# Patient Record
Sex: Female | Born: 1980
Health system: Southern US, Community
[De-identification: ages and names within clinical notes are randomized; demographics above are authoritative.]

## PROBLEM LIST (undated history)

## (undated) ENCOUNTER — Ambulatory Visit: Admission: EM

## (undated) ENCOUNTER — Inpatient Hospital Stay (HOSPITAL_COMMUNITY): Payer: Self-pay

## (undated) DIAGNOSIS — A630 Anogenital (venereal) warts: Secondary | ICD-10-CM

## (undated) DIAGNOSIS — D649 Anemia, unspecified: Secondary | ICD-10-CM

## (undated) DIAGNOSIS — Z789 Other specified health status: Secondary | ICD-10-CM

## (undated) DIAGNOSIS — K219 Gastro-esophageal reflux disease without esophagitis: Secondary | ICD-10-CM

## (undated) DIAGNOSIS — D219 Benign neoplasm of connective and other soft tissue, unspecified: Secondary | ICD-10-CM

## (undated) DIAGNOSIS — E079 Disorder of thyroid, unspecified: Secondary | ICD-10-CM

## (undated) HISTORY — PX: NO PAST SURGERIES: SHX2092

---

## 2007-07-23 ENCOUNTER — Ambulatory Visit: Payer: Self-pay | Admitting: Vascular Surgery

## 2008-07-23 ENCOUNTER — Ambulatory Visit: Payer: Self-pay | Admitting: Vascular Surgery

## 2010-08-09 NOTE — Consult Note (Signed)
VASCULAR SURGERY CONSULTATION   Selena Scott, Aidah  DOB:  March 16, 1981                                       07/23/2007  ZOXWR#:[id number]   The patient was referred for vascular surgery consultation regarding  possible venous insufficiency of her right lower extremity.  This 30-  year-old female who is gravida 2 para 2 had her most recent pregnancy 1  year ago.  She developed some varicosities in the right leg during the  third trimester and has had aching discomfort associated with these  varicosities in the right leg since then.  She states that it improved  the first few months after pregnancy, but then worsened over the last  several months.  She has not worn elastic compression stockings since  her pregnancy, but does elevate the leg periodically with some  improvement.  She has no distal edema and has no history of deep venous  thrombosis, thrombophlebitis, pulmonary emboli, bleeding, ulceration, et  Selena Scott.  She states that the discomfort becomes so severe that she has  difficulty working, which requires standing.  This is in the thigh and  posterior calf region of the right leg.   PAST MEDICAL HISTORY:  Negative for diabetes, hypertension, coronary  artery disease, stroke, COPD, hyperlipidemia, or asthma.   PREVIOUS SURGERY:  None.   FAMILY HISTORY:  Is negative for diabetes, positive for coronary artery  disease with grandfather having 2 myocardial infarctions, positive for  stroke.   SOCIAL HISTORY:  She is single, has 2 children, works in Air[employer].  She  does not use tobacco or alcohol.   REVIEW OF SYSTEMS:  Remarkable in all systems.   On physical exam, blood pressure 110/67, heart rate is 70, respirations  14.  Generally, she is a healthy-appearing female in no apparent  distress, alert and oriented x3.  Her neck is supple, 3+ carotid pulses  palpable.  No bruits are audible.  Neurologic exam is normal.  No  palpable adenopathy in the neck.  Chest,  clear to auscultation.  Cardiovascular exam reveals a regular rhythm with no murmurs.  Abdomen  is soft, nontender with no palpable masses.  Extremity exam reveals 3+  femoral, popliteal, dorsalis pedis, and posterior tibial pulses  palpable.  There is no distal edema.  Right leg has some small  varicosities in the distal medial thigh and posteriorly.  There is a  prominent reticular vein in the distal thigh extending down to the  popliteal fossa, a few small varicosities in the posterior calf.  No  hyperpigmentation, ulceration, or serious severe venous insufficiency.   Venous duplex exam was performed.  There is no evidence of reflux in the  great saphenous vein or the small saphenous vein and the deep system is  widely patent.   She was reassured regarding these findings.  It was suggested she wear  elastic compression stockings to relieve these symptoms to help her with  work, and if she needs treatment, primary sclerotherapy would be the  best option for these.   Selena Scott, M.D.  Electronically Signed  JDL/MEDQ  D:  07/23/2007  T:  07/24/2007  Job:  [id number]

## 2010-08-09 NOTE — Procedures (Signed)
LOWER EXTREMITY VENOUS REFLUX EXAM   INDICATION:  Right leg varicose vein with pain and swelling.   EXAM:  Using color-flow imaging and pulse Doppler spectral analysis, the  right femoral, superficial femoral, popliteal, posterior tibial, greater  and lesser saphenous veins are evaluated.  There is no evidence  suggesting deep venous insufficiency in the right lower extremity.   The right saphenofemoral junction is competent.  The right GSV is  competent with the caliber as described below.   The right proximal short saphenous vein demonstrates competency.   GSV Diameter (used if found to be incompetent only)                                            Right    Left  Proximal Greater Saphenous Vein           cm       cm  Proximal-to-mid-thigh                     cm       cm  Mid thigh                                 cm       cm  Mid-distal thigh                          cm       cm  Distal thigh                              cm       cm  Knee                                      cm       cm   IMPRESSION:  1. No evidence of reflux noted in the right leg.  2. The right greater saphenous vein is not aneurysmal.  3. The right greater saphenous vein is not tortuous.  4. The deep system is competent.  5. The right lesser saphenous vein is competent.  6. No evidence of deep venous thrombosis noted in the right leg.   ___________________________________________  Quita Skye. Hart Rochester, M.D.   MG/MEDQ  D:  07/23/2007  T:  07/23/2007  Job:  [id number]

## 2015-03-28 NOTE — L&D Delivery Note (Signed)
Delivery Note At 9:05 AM a viable female was delivered via Vaginal, Spontaneous Delivery (Presentation: vtx; ROP ).  APGAR: 9, 9; weight  pending.   Placenta status: spontaneous, intact.  Cord:  with the following complications: nuchal x 2 reduced.   Anesthesia:  Epidural Episiotomy:  None Lacerations:  1st degree Suture Repair: none Est. Blood Loss (mL):  200  Mom to postpartum.  Baby to Couplet care / Skin to Skin.  Will do circumcision in the office  Orlando Devereux D 01/13/2016, 9:19 AM

## 2015-06-04 DIAGNOSIS — A6000 Herpesviral infection of urogenital system, unspecified: Secondary | ICD-10-CM | POA: Insufficient documentation

## 2015-07-01 LAB — OB RESULTS CONSOLE HIV ANTIBODY (ROUTINE TESTING): HIV: NONREACTIVE

## 2015-07-01 LAB — OB RESULTS CONSOLE RUBELLA ANTIBODY, IGM: Rubella: IMMUNE

## 2015-07-01 LAB — OB RESULTS CONSOLE RPR: RPR: NONREACTIVE

## 2015-07-01 LAB — OB RESULTS CONSOLE ANTIBODY SCREEN: ANTIBODY SCREEN: NEGATIVE

## 2015-07-01 LAB — OB RESULTS CONSOLE GC/CHLAMYDIA
CHLAMYDIA, DNA PROBE: NEGATIVE
GC PROBE AMP, GENITAL: NEGATIVE

## 2015-07-01 LAB — OB RESULTS CONSOLE ABO/RH: RH Type: POSITIVE

## 2015-07-01 LAB — OB RESULTS CONSOLE HEPATITIS B SURFACE ANTIGEN: Hepatitis B Surface Ag: NEGATIVE

## 2015-07-30 ENCOUNTER — Other Ambulatory Visit (HOSPITAL_COMMUNITY): Payer: Self-pay | Admitting: Obstetrics and Gynecology

## 2015-07-30 DIAGNOSIS — Z3A18 18 weeks gestation of pregnancy: Secondary | ICD-10-CM

## 2015-07-30 DIAGNOSIS — O09522 Supervision of elderly multigravida, second trimester: Secondary | ICD-10-CM

## 2015-07-30 DIAGNOSIS — Z3689 Encounter for other specified antenatal screening: Secondary | ICD-10-CM

## 2015-08-03 ENCOUNTER — Encounter (HOSPITAL_COMMUNITY): Payer: Self-pay | Admitting: *Deleted

## 2015-08-03 ENCOUNTER — Inpatient Hospital Stay (HOSPITAL_COMMUNITY)
Admission: AD | Admit: 2015-08-03 | Discharge: 2015-08-03 | Disposition: A | Discharge status: Home / Self Care | Payer: Managed Care, Other (non HMO) | Source: Ambulatory Visit | Attending: Obstetrics and Gynecology | Admitting: Obstetrics and Gynecology

## 2015-08-03 DIAGNOSIS — Z3A16 16 weeks gestation of pregnancy: Secondary | ICD-10-CM | POA: Diagnosis not present

## 2015-08-03 DIAGNOSIS — O99612 Diseases of the digestive system complicating pregnancy, second trimester: Secondary | ICD-10-CM | POA: Diagnosis not present

## 2015-08-03 DIAGNOSIS — A084 Viral intestinal infection, unspecified: Secondary | ICD-10-CM | POA: Diagnosis not present

## 2015-08-03 DIAGNOSIS — R197 Diarrhea, unspecified: Secondary | ICD-10-CM | POA: Diagnosis present

## 2015-08-03 HISTORY — DX: Other specified health status: Z78.9

## 2015-08-03 LAB — URINALYSIS, ROUTINE W REFLEX MICROSCOPIC
BILIRUBIN URINE: NEGATIVE
GLUCOSE, UA: NEGATIVE mg/dL
KETONES UR: 40 mg/dL — AB
Leukocytes, UA: NEGATIVE
Nitrite: NEGATIVE
PROTEIN: NEGATIVE mg/dL
Specific Gravity, Urine: 1.025 (ref 1.005–1.030)
pH: 5.5 (ref 5.0–8.0)

## 2015-08-03 LAB — COMPREHENSIVE METABOLIC PANEL
ALBUMIN: 3.3 g/dL — AB (ref 3.5–5.0)
ALK PHOS: 57 U/L (ref 38–126)
ALT: 26 U/L (ref 14–54)
ANION GAP: 10 (ref 5–15)
AST: 35 U/L (ref 15–41)
BUN: 5 mg/dL — ABNORMAL LOW (ref 6–20)
CALCIUM: 9 mg/dL (ref 8.9–10.3)
CO2: 20 mmol/L — AB (ref 22–32)
CREATININE: 0.47 mg/dL (ref 0.44–1.00)
Chloride: 105 mmol/L (ref 101–111)
GFR calc Af Amer: >60 mL/min (ref >60)
GFR calc non Af Amer: >60 mL/min (ref >60)
GLUCOSE: 83 mg/dL (ref 65–99)
Potassium: 3.7 mmol/L (ref 3.5–5.1)
SODIUM: 135 mmol/L (ref 135–145)
Total Bilirubin: 0.9 mg/dL (ref 0.3–1.2)
Total Protein: 6.4 g/dL — ABNORMAL LOW (ref 6.5–8.1)

## 2015-08-03 LAB — URINE MICROSCOPIC-ADD ON

## 2015-08-03 LAB — CBC
HCT: 34.9 % — ABNORMAL LOW (ref 36.0–46.0)
HEMOGLOBIN: 12.7 g/dL (ref 12.0–15.0)
MCH: 28.9 pg (ref 26.0–34.0)
MCHC: 36.4 g/dL — ABNORMAL HIGH (ref 30.0–36.0)
MCV: 79.5 fL (ref 78.0–100.0)
Platelets: 244 10*3/uL (ref 150–400)
RBC: 4.39 MIL/uL (ref 3.87–5.11)
RDW: 13.6 % (ref 11.5–15.5)
WBC: 6.6 10*3/uL (ref 4.0–10.5)

## 2015-08-03 MED ORDER — PROMETHAZINE HCL 12.5 MG PO TABS
12.5000 mg | ORAL_TABLET | Freq: Four times a day (QID) | ORAL | Status: DC | PRN
Start: 2015-08-03 — End: 2016-01-13

## 2015-08-03 MED ORDER — ONDANSETRON HCL 4 MG PO TABS
4.0000 mg | ORAL_TABLET | Freq: Four times a day (QID) | ORAL | Status: DC
Start: 2015-08-03 — End: 2016-01-13

## 2015-08-03 MED ORDER — DEXTROSE 5 % IN LACTATED RINGERS IV BOLUS
1000.0000 mL | Freq: Once | INTRAVENOUS | Status: AC
Start: 2015-08-03 — End: 2015-08-03
  Administered 2015-08-03 (×2): 1000 mL via INTRAVENOUS

## 2015-08-03 MED ORDER — LACTATED RINGERS IV SOLN: INTRAVENOUS | Status: DC

## 2015-08-03 MED ORDER — PROMETHAZINE HCL 25 MG/ML IJ SOLN
25.0000 mg | Freq: Once | INTRAVENOUS | Status: AC
  Administered 2015-08-03: 25 mg via INTRAVENOUS
  Filled 2015-08-03: qty 1

## 2015-08-03 MED ORDER — SODIUM CHLORIDE 0.9 % IV SOLN
8.0000 mg | Freq: Once | INTRAVENOUS | Status: AC
  Administered 2015-08-03: 8 mg via INTRAVENOUS
  Filled 2015-08-03: qty 4

## 2015-08-03 NOTE — Progress Notes (Signed)
Aware of pt's admisson and status. MAU provider to see pt

## 2015-08-03 NOTE — Discharge Instructions (Signed)
Viral Gastroenteritis Viral gastroenteritis is also called stomach flu. This illness is caused by a certain type of germ (virus). It can cause sudden watery poop (diarrhea) and throwing up (vomiting). This can cause you to lose body fluids (dehydration). This illness usually lasts for 3 to 8 days. It usually goes away on its own. HOME CARE   Drink enough fluids to keep your pee (urine) clear or pale yellow. Drink small amounts of fluids often.  Ask your doctor how to replace body fluid losses (rehydration).  Avoid:  Foods high in sugar.  Alcohol.  Bubbly (carbonated) drinks.  Tobacco.  Juice.  Caffeine drinks.  Very hot or cold fluids.  Fatty, greasy foods.  Eating too much at one time.  Dairy products until 24 to 48 hours after your watery poop stops.  You may eat foods with active cultures (probiotics). They can be found in some yogurts and supplements.  Wash your hands well to avoid spreading the illness.  Only take medicines as told by your doctor. Do not give aspirin to children. Do not take medicines for watery poop (antidiarrheals).  Ask your doctor if you should keep taking your regular medicines.  Keep all doctor visits as told. GET HELP RIGHT AWAY IF:   You cannot keep fluids down.  You do not pee at least once every 6 to 8 hours.  You are short of breath.  You see blood in your poop or throw up. This may look like coffee grounds.  You have belly (abdominal) pain that gets worse or is just in one small spot (localized).  You keep throwing up or having watery poop.  You have a fever.  The patient is a child younger than 3 months, and he or she has a fever.  The patient is a child older than 3 months, and he or she has a fever and problems that do not go away.  The patient is a child older than 3 months, and he or she has a fever and problems that suddenly get worse.  The patient is a baby, and he or she has no tears when crying. MAKE SURE YOU:     Understand these instructions.  Will watch your condition.  Will get help right away if you are not doing well or get worse.   This information is not intended to replace advice given to you by your health care provider. Make sure you discuss any questions you have with your health care provider.   Document Released: 08/30/2007 Document Revised: 06/05/2011 Document Reviewed: 12/28/2010 Elsevier Interactive Patient Education 2016 [employer] Choices to Help Relieve Diarrhea, Adult When you have diarrhea, the foods you eat and your eating habits are very important. Choosing the right foods and drinks can help relieve diarrhea. Also, because diarrhea can last up to 7 days, you need to replace lost fluids and electrolytes (such as sodium, potassium, and chloride) in order to help prevent dehydration.  WHAT GENERAL GUIDELINES DO I NEED TO FOLLOW?  Slowly drink 1 cup (8 oz) of fluid for each episode of diarrhea. If you are getting enough fluid, your urine will be clear or pale yellow.  Eat starchy foods. Some good choices include white rice, white toast, pasta, low-fiber cereal, baked potatoes (without the skin), saltine crackers, and bagels.  Avoid large servings of any cooked vegetables.  Limit fruit to two servings per day. A serving is  cup or 1 small piece.  Choose foods with less than 2  g of fiber per serving.  Limit fats to less than 8 tsp (38 g) per day.  Avoid fried foods.  Eat foods that have probiotics in them. Probiotics can be found in certain dairy products.  Avoid foods and beverages that may increase the speed at which food moves through the stomach and intestines (gastrointestinal tract). Things to avoid include:  High-fiber foods, such as dried fruit, raw fruits and vegetables, nuts, seeds, and whole grain foods.  Spicy foods and high-fat foods.  Foods and beverages sweetened with high-fructose corn syrup, honey, or sugar alcohols such as xylitol,  sorbitol, and mannitol. WHAT FOODS ARE RECOMMENDED? Grains White rice. White, [place], or pita breads (fresh or toasted), including plain rolls, buns, or bagels. White pasta. Saltine, soda, or graham crackers. Pretzels. Low-fiber cereal. Cooked cereals made with water (such as cornmeal, farina, or cream cereals). Plain muffins. Matzo. Melba toast. Zwieback.  Vegetables Potatoes (without the skin). Strained tomato and vegetable juices. Most well-cooked and canned vegetables without seeds. Tender lettuce. Fruits Cooked or canned applesauce, apricots, cherries, fruit cocktail, grapefruit, peaches, pears, or plums. Fresh bananas, apples without skin, cherries, grapes, cantaloupe, grapefruit, peaches, oranges, or plums.  Meat and Other Protein Products Baked or boiled chicken. Eggs. Tofu. Fish. Seafood. Smooth peanut butter. Ground or well-cooked tender beef, ham, veal, lamb, pork, or poultry.  Dairy Plain yogurt, kefir, and unsweetened liquid yogurt. Lactose-free milk, buttermilk, or soy milk. Plain hard cheese. Beverages Sport drinks. Clear broths. Diluted fruit juices (except prune). Regular, caffeine-free sodas such as ginger ale. Water. Decaffeinated teas. Oral rehydration solutions. Sugar-free beverages not sweetened with sugar alcohols. Other Bouillon, broth, or soups made from recommended foods.  The items listed above may not be a complete list of recommended foods or beverages. Contact your dietitian for more options. WHAT FOODS ARE NOT RECOMMENDED? Grains Whole grain, whole wheat, bran, or rye breads, rolls, pastas, crackers, and cereals. Wild or brown rice. Cereals that contain more than 2 g of fiber per serving. Corn tortillas or taco shells. Cooked or dry oatmeal. Granola. Popcorn. Vegetables Raw vegetables. Cabbage, broccoli, Brussels sprouts, artichokes, baked beans, beet greens, corn, kale, legumes, peas, sweet potatoes, and yams. Potato skins. Cooked spinach and  cabbage. Fruits Dried fruit, including raisins and dates. Raw fruits. Stewed or dried prunes. Fresh apples with skin, apricots, mangoes, pears, raspberries, and strawberries.  Meat and Other Protein Products Chunky peanut butter. Nuts and seeds. Beans and lentils. Berniece Salines.  Dairy High-fat cheeses. Milk, chocolate milk, and beverages made with milk, such as milk shakes. Cream. Ice cream. Sweets and Desserts Sweet rolls, doughnuts, and sweet breads. Pancakes and waffles. Fats and Oils Butter. Cream sauces. Margarine. Salad oils. Plain salad dressings. Olives. Avocados.  Beverages Caffeinated beverages (such as coffee, tea, soda, or energy drinks). Alcoholic beverages. Fruit juices with pulp. Prune juice. Soft drinks sweetened with high-fructose corn syrup or sugar alcohols. Other Coconut. Hot sauce. Chili powder. Mayonnaise. Gravy. Cream-based or milk-based soups.  The items listed above may not be a complete list of foods and beverages to avoid. Contact your dietitian for more information. WHAT SHOULD I DO IF I BECOME DEHYDRATED? Diarrhea can sometimes lead to dehydration. Signs of dehydration include dark urine and dry mouth and skin. If you think you are dehydrated, you should rehydrate with an oral rehydration solution. These solutions can be purchased at pharmacies, retail stores, or online.  Drink -1 cup (120-240 mL) of oral rehydration solution each time you have an episode of diarrhea. If drinking this  amount makes your diarrhea worse, try drinking smaller amounts more often. For example, drink 1-3 tsp (5-15 mL) every 5-10 minutes.  A general rule for staying hydrated is to drink 1-2 L of fluid per day. Talk to your health care provider about the specific amount you should be drinking each day. Drink enough fluids to keep your urine clear or pale yellow.   This information is not intended to replace advice given to you by your health care provider. Make sure you discuss any questions you  have with your health care provider.   Document Released: 06/03/2003 Document Revised: 04/03/2014 Document Reviewed: 02/03/2013 Elsevier Interactive Patient Education [employer].

## 2015-08-03 NOTE — MAU Note (Addendum)
Yesterday was very bloated and took 2 gas pills. Few hours later started with diarrhea which continues. L breast pain. Very weak and uncomfortable.Nausea but no vomiting. No appetite. Denies vag bleeding or d/c. Had headache earlier today and Tylenol helped with that

## 2015-08-03 NOTE — MAU Provider Note (Signed)
History     CSN: [id number]  Arrival date and time: 08/03/15 1615   First Provider Initiated Contact with Patient 08/03/15 1729      Chief Complaint  Patient presents with  . Diarrhea  . Morning Sickness  . Abdominal Pain   HPI  Selena Scott is a 35 y.o. G3P2002 at [redacted]w[redacted]d who presents with complaints of nausea, diarrhea, and abdominal pain. Symptoms began yesterday. Reports 4 watery stools in the last 24 hours. Nauseated; no vomiting. Generalized abdominal pain that comes & goes; rates pain 5/10; no treatment. Pain occurs every few hours and lasts for a few minutes at a time.  Denies fever, LOF, vaginal bleeding. No sick contacts. Also reports chest pain; unsure if it's in her breast or chest. Worse with palpation. Feels "heavy". Denies SOB or palpitations.   OB History    Gravida Para Term Preterm AB TAB SAB Ectopic Multiple Living   3 2 2       2       Past Medical History  Diagnosis Date  . Medical history non-contributory     Past Surgical History  Procedure Laterality Date  . No past surgeries      History reviewed. No pertinent family history.  Social History  Substance Use Topics  . Smoking status: Never Smoker   . Smokeless tobacco: None  . Alcohol Use: No    Allergies: No Known Allergies  Prescriptions prior to admission  Medication Sig Dispense Refill Last Dose  . acetaminophen (TYLENOL) 500 MG tablet Take 1,000 mg by mouth every 6 (six) hours as needed for moderate pain.    08/03/2015 at 1530  . Prenatal Vit-Fe Fumarate-FA (PRENATAL MULTIVITAMIN) TABS tablet Take 1 tablet by mouth daily at 12 noon.   08/02/2015 at Unknown time  . Simethicone 125 MG TABS Take 2 tablets by mouth daily as needed (gas).    08/02/2015 at Unknown time    Review of Systems  Constitutional: Negative for fever and chills.  Respiratory: Negative.   Cardiovascular: Positive for chest pain. Negative for palpitations.  Gastrointestinal: Positive for nausea, abdominal pain and diarrhea.  Negative for vomiting, constipation and blood in stool.  Genitourinary: Negative.    Physical Exam   Blood pressure 118/63, pulse 81, temperature 97.8 F (36.6 C), resp. rate 18, height 5\' 3"  (1.6 m), weight 147 lb 6.4 oz (66.86 kg), SpO2 100 %.  Physical Exam  Nursing note and vitals reviewed. Constitutional: She is oriented to person, place, and time. She appears well-developed and well-nourished. No distress.  HENT:  Head: Normocephalic and atraumatic.  Eyes: Conjunctivae are normal. Right eye exhibits no discharge. Left eye exhibits no discharge. No scleral icterus.  Neck: Normal range of motion.  Cardiovascular: Normal rate, regular rhythm and normal heart sounds.   No murmur heard. Respiratory: Effort normal and breath sounds normal. No respiratory distress. She has no wheezes.  GI: Soft. Bowel sounds are normal. There is no tenderness.  Neurological: She is alert and oriented to person, place, and time.  Skin: Skin is warm and dry. She is not diaphoretic.  Psychiatric: She has a normal mood and affect. Her behavior is normal. Judgment and thought content normal.    MAU Course  Procedures Results for orders placed or performed during the hospital encounter of 08/03/15 (from the past 24 hour(s))  Urinalysis, Routine w reflex microscopic (not at [facility])     Status: Abnormal   Collection Time: 08/03/15  4:40 PM  Result Value Ref Range  Color, Urine YELLOW YELLOW   APPearance CLEAR CLEAR   Specific Gravity, Urine 1.025 1.005 - 1.030   pH 5.5 5.0 - 8.0   Glucose, UA NEGATIVE NEGATIVE mg/dL   Hgb urine dipstick TRACE (A) NEGATIVE   Bilirubin Urine NEGATIVE NEGATIVE   Ketones, ur 40 (A) NEGATIVE mg/dL   Protein, ur NEGATIVE NEGATIVE mg/dL   Nitrite NEGATIVE NEGATIVE   Leukocytes, UA NEGATIVE NEGATIVE  Urine microscopic-add on     Status: Abnormal   Collection Time: 08/03/15  4:40 PM  Result Value Ref Range   Squamous Epithelial / LPF 0-5 (A) NONE SEEN   WBC, UA 0-5 0 - 5  WBC/hpf   RBC / HPF 0-5 0 - 5 RBC/hpf   Bacteria, UA FEW (A) NONE SEEN   Urine-Other MUCOUS PRESENT   CBC     Status: Abnormal   Collection Time: 08/03/15  6:15 PM  Result Value Ref Range   WBC 6.6 4.0 - 10.5 K/uL   RBC 4.39 3.87 - 5.11 MIL/uL   Hemoglobin 12.7 12.0 - 15.0 g/dL   HCT 34.9 (L) 36.0 - 46.0 %   MCV 79.5 78.0 - 100.0 fL   MCH 28.9 26.0 - 34.0 pg   MCHC 36.4 (H) 30.0 - 36.0 g/dL   RDW 13.6 11.5 - 15.5 %   Platelets 244 150 - 400 K/uL  Comprehensive metabolic panel     Status: Abnormal   Collection Time: 08/03/15  6:15 PM  Result Value Ref Range   Sodium 135 135 - 145 mmol/L   Potassium 3.7 3.5 - 5.1 mmol/L   Chloride 105 101 - 111 mmol/L   CO2 20 (L) 22 - 32 mmol/L   Glucose, Bld 83 65 - 99 mg/dL   BUN 5 (L) 6 - 20 mg/dL   Creatinine, Ser 0.47 0.44 - 1.00 mg/dL   Calcium 9.0 8.9 - 10.3 mg/dL   Total Protein 6.4 (L) 6.5 - 8.1 g/dL   Albumin 3.3 (L) 3.5 - 5.0 g/dL   AST 35 15 - 41 U/L   ALT 26 14 - 54 U/L   Alkaline Phosphatase 57 38 - 126 U/L   Total Bilirubin 0.9 0.3 - 1.2 mg/dL   GFR calc non Af Amer >60 >60 mL/min   GFR calc Af Amer >60 >60 mL/min   Anion gap 10 5 - 15    MDM FHT 159 by doppler [id number] & zofran 8 mg IV Cervix closed Pt states pain improved but nausea worse Phenergan 25 mg in bag of LR  Care turned over to [facility] [place], NP 08/03/2015 8:05 PM   2005 - Care assumed from Jorje Guild, NP. Patient receiving IV fluids and Phenergan.  Discussed patient with Dr. Melba Coon. She agrees with plan for discharge with Rx for anti-emetics today. Follow-up as scheduled.  Assessment and Plan  A: [facility] at [redacted]w[redacted]d Viral gastroenteritis  P: Discharge home Rx for Phenergan and Zofran given to patient  Diet for diarrhea included in AVS Patient advised to follow-up with [facility] as scheduled for routine prenatal care or sooner PRN Patient may return to MAU as needed or if her condition were to change or worsen  Luvenia Redden, PA-C 08/03/2015 9:18 PM

## 2015-08-06 ENCOUNTER — Ambulatory Visit (HOSPITAL_COMMUNITY): Payer: Self-pay

## 2015-08-06 ENCOUNTER — Ambulatory Visit (HOSPITAL_COMMUNITY)
Admission: RE | Admit: 2015-08-06 | Discharge: 2015-08-06 | Disposition: A | Discharge status: Home / Self Care | Payer: Managed Care, Other (non HMO) | Source: Ambulatory Visit | Attending: Obstetrics and Gynecology | Admitting: Obstetrics and Gynecology

## 2015-08-06 DIAGNOSIS — O09529 Supervision of elderly multigravida, unspecified trimester: Secondary | ICD-10-CM | POA: Insufficient documentation

## 2015-08-06 NOTE — Progress Notes (Signed)
Genetic Counseling  High-Risk Gestation Note  Appointment Date:  08/06/2015 Referred By: Sherlyn Hay, * Date of Birth:  June 17, 1980   Pregnancy History: [id number] Estimated Date of Delivery: 01/15/16 Estimated Gestational Age: [redacted]w[redacted]d Attending: Renella Cunas, MD  [name] was seen for genetic counseling regarding a maternal age of 35 y.o. and a screen positive risk for Down syndrome based on Quad screen.  In summary:   Discussed AMA and associated risk for fetal aneuploidy  Reviewed results of screening  Quad screen: 1 in 267 risk for Down syndrome (reduced from age related risk)  Discussed options for additional screening  NIPS (Panorama) performed and within normal limits  Ultrasound-scheduled for 08/20/15  Discussed diagnostic testing options  Amniocentesis: patient declined  Reviewed family history concerns  Patient had cystic fibrosis carrier screen and sickle cell screen through her OB which was within normal range.   She was counseled regarding maternal age and the association with risk for chromosome conditions due to nondisjunction with aging of the ova.   We reviewed chromosomes, nondisjunction, and the associated 1 in 67 risk for fetal aneuploidy related to a maternal age of 35 y.o. at [redacted]w[redacted]d gestation. She was counseled that the risk for aneuploidy decreases as gestational age increases, accounting for those pregnancies which spontaneously abort.  We specifically discussed Down syndrome (trisomy 51), trisomies 32 and 39, and sex chromosome aneuploidies (47,XXX and 47,XXY) including the common features and prognoses of each.   We also reviewed Selena Scott's maternal serum Quad screen result and the associated decrease in risk for fetal Down syndrome (1 in 159 to 1 in 268).  However, given that 1 in 268 is above the screen's cutoff, this is reported technically as a screen positive result. They were counseled regarding other explanations for a screen positive  result including normal variation and differences in maternal metabolism.  In addition, we reviewed the screen adjusted reduction in risks for trisomy 18 and ONTDs.  They understand that Quad screening provides a pregnancy specific risk for Down syndrome, but is not considered to be diagnostic.    Selena Scott had noninvasive prenatal screening (NIPS)/prenatal cell free DNA testing performed through her OB office. We reviewed these results, Panorama through [facility], which are within normal limits, showing a less than 1 in 10,000 risk for trisomies 21, 18 and 13, and monosomy X (Turner syndrome).  In addition, the risk for triploidy and sex chromosome trisomies (47,XXX and 47,XXY) was also low risk. We reviewed that this testing identifies > 99% of pregnancies with trisomy 57, trisomy 87, sex chromosome trisomies (47,XXX and 47,XXY), and triploidy. The detection rate for trisomy 18 is 96%.  The detection rate for monosomy X is ~92%.  The false positive rate is <0.1% for all conditions. Testing was also consistent with female fetal sex.  The patient did wish to know fetal sex.  She understands that this testing does not identify all genetic conditions. We reviewed that this testing is highly specific and sensitive but is not considered diagnostic.   We reviewed other available screening option of detailed ultrasound. We reviewed the benefits and limitations of ultrasound as a screen tool for fetal aneuploidy.  Detailed ultrasound is scheduled for 08/20/15. We discussed the diagnostic testing option of amniocentesis.  We reviewed the approximate 1 in [id number] risk for complications, including spontaneous pregnancy loss.  We discussed the possible results that the tests might provide including: positive, negative, unanticipated, and no result. Finally, they were counseled regarding the  cost of each option and potential out of pocket expenses. After consideration of all the options, Selena Scott stated that she was  comfortable with the risk assessment provided by NIPS and declined amniocentesis.   Both family histories were reviewed and found to be contributory for autism for the father of the pregnancy's maternal uncle. No information was known regarding an underlying etiology. We discussed that autism is part of the spectrum of conditions referred to as Autistic spectrum disorders (ASD). We discussed that ASDs are among the most common neurodevelopmental disorders, with approximately 1 in 68 children meeting criteria for ASD, according to the Centers for Disease Control. Approximately 80% of individuals diagnosed are female. There is strong evidence that genetic factors play a critical role in development of ASD. There have been recent advances in identifying specific genetic causes of ASD, however, there are still many individuals for whom the etiology of the ASD is not known. Some individuals with ASDs are found to have causative differences in karyotype analysis, chromosomal microarray analysis, or single genes.  In the absence of an identified genetic etiology, prenatal screening or testing would not be available in the current pregnancy for the autism spectrum disorders in the family.  Additionally, Mrs. Scott reported that her maternal aunt (one of 49 siblings) has mild intellectual disability due to oxygen deprivation at birth. She is otherwise healthy and manages a daycare. She was not described to have physical differences from relatives. Mrs. Scott was counseled that there are many different causes of intellectual disabilities including environmental, multifactorial, and genetic etiologies.  We discussed that a specific diagnosis for intellectual disability can be determined in approximately 50% of these individuals.  In the remaining 50% of individuals, a diagnosis may never be determined.  Regarding genetic causes, we discussed that chromosome aberrations (aneuploidy, deletions, duplications, insertions, and  translocations) are responsible for a small percentage of individuals with intellectual disability.  Many individuals with chromosome aberrations have additional differences, including congenital anomalies or minor dysmorphisms.  Likewise, single gene conditions are the underlying cause of intellectual delay in some families.  We discussed that many gene conditions have intellectual disability as a feature, but also often include other physical or medical differences. In the case of an environmental cause, recurrence risk would not be increased for relatives. We discussed that without more specific information, it is difficult to provide an accurate risk assessment.  Further genetic counseling is warranted if more information is obtained.  Selena Scott denied exposure to environmental toxins or chemical agents. She denied the use of alcohol, tobacco or street drugs. She denied significant viral illnesses during the course of her pregnancy. Her medical and surgical histories were noncontributory.     I counseled this couple regarding the above risks and available options.  The approximate face-to-face time with the genetic counselor was 45 minutes.    Chipper Oman, MS,  Certified Genetic Counselor 08/06/2015

## 2015-08-11 ENCOUNTER — Encounter (HOSPITAL_COMMUNITY): Payer: Self-pay

## 2015-08-11 ENCOUNTER — Other Ambulatory Visit (HOSPITAL_COMMUNITY): Payer: Self-pay

## 2015-08-20 ENCOUNTER — Ambulatory Visit (HOSPITAL_COMMUNITY): Admission: RE | Admit: 2015-08-20 | Payer: Self-pay | Source: Ambulatory Visit

## 2015-12-13 ENCOUNTER — Inpatient Hospital Stay (HOSPITAL_COMMUNITY)
Admission: AD | Admit: 2015-12-13 | Discharge: 2015-12-13 | Disposition: A | Discharge status: Home / Self Care | Payer: Managed Care, Other (non HMO) | Source: Ambulatory Visit | Attending: Obstetrics and Gynecology | Admitting: Obstetrics and Gynecology

## 2015-12-13 ENCOUNTER — Encounter (HOSPITAL_COMMUNITY): Payer: Self-pay | Admitting: *Deleted

## 2015-12-13 DIAGNOSIS — R1030 Lower abdominal pain, unspecified: Secondary | ICD-10-CM | POA: Diagnosis present

## 2015-12-13 DIAGNOSIS — O4703 False labor before 37 completed weeks of gestation, third trimester: Secondary | ICD-10-CM | POA: Diagnosis not present

## 2015-12-13 DIAGNOSIS — O479 False labor, unspecified: Secondary | ICD-10-CM

## 2015-12-13 DIAGNOSIS — O09523 Supervision of elderly multigravida, third trimester: Secondary | ICD-10-CM | POA: Insufficient documentation

## 2015-12-13 DIAGNOSIS — Z3A35 35 weeks gestation of pregnancy: Secondary | ICD-10-CM | POA: Diagnosis not present

## 2015-12-13 HISTORY — DX: Anogenital (venereal) warts: A63.0

## 2015-12-13 HISTORY — DX: Benign neoplasm of connective and other soft tissue, unspecified: D21.9

## 2015-12-13 LAB — URINALYSIS, ROUTINE W REFLEX MICROSCOPIC
BILIRUBIN URINE: NEGATIVE
GLUCOSE, UA: NEGATIVE mg/dL
HGB URINE DIPSTICK: NEGATIVE
KETONES UR: 15 mg/dL — AB
Leukocytes, UA: NEGATIVE
Nitrite: NEGATIVE
PROTEIN: NEGATIVE mg/dL
Specific Gravity, Urine: 1.02 (ref 1.005–1.030)
pH: 6 (ref 5.0–8.0)

## 2015-12-13 NOTE — MAU Note (Signed)
Pt c/o constant lower back pain that started 2 days ago. Has abdominal pain that started tonight that she states is "all over". Denies LOF or vag bleeding. +FM. States last BM was yesterday-does not think she is constipated. +FM.

## 2015-12-13 NOTE — MAU Provider Note (Signed)
History     CSN: [id number]  Arrival date and time: 12/13/15 [id number]   First Provider Initiated Contact with Patient 12/13/15 0153      Chief Complaint  Patient presents with  . Abdominal Cramping   Selena Scott is a 35 y.o. G3P2002 at [redacted]w[redacted]d who presents today with lower abdominal pain and back pain. She states that the pain started about 2 hours ago. She denies any VB or LOF. She confirms normal fetal movement. SHe denies any complications with this pregnancy or her previous pregnancies.    Abdominal Cramping  This is a new problem. The current episode started today. The onset quality is sudden. The problem occurs intermittently. The problem has been unchanged. The pain is located in the generalized abdominal region. The pain is at a severity of 5/10. The quality of the pain is cramping. The abdominal pain radiates to the back. Pertinent negatives include no constipation, diarrhea, dysuria, fever, frequency, nausea or vomiting. Nothing aggravates the pain. The pain is relieved by nothing. She has tried nothing for the symptoms.    Past Medical History:  Diagnosis Date  . Fibroid   . HPV (human papilloma virus) anogenital infection   . Medical history non-contributory     Past Surgical History:  Procedure Laterality Date  . NO PAST SURGERIES      History reviewed. No pertinent family history.  Social History  Substance Use Topics  . Smoking status: Never Smoker  . Smokeless tobacco: Never Used  . Alcohol use No    Allergies: No Known Allergies  Prescriptions Prior to Admission  Medication Sig Dispense Refill Last Dose  . Prenatal Vit-Fe Fumarate-FA (PRENATAL MULTIVITAMIN) TABS tablet Take 1 tablet by mouth daily at 12 noon.   Past Week at Unknown time  . acetaminophen (TYLENOL) 500 MG tablet Take 1,000 mg by mouth every 6 (six) hours as needed for moderate pain.    More than a month at Unknown time  . ondansetron (ZOFRAN) 4 MG tablet Take 1 tablet (4 mg total) by mouth  every 6 (six) hours. 12 tablet 0 More than a month at Unknown time  . promethazine (PHENERGAN) 12.5 MG tablet Take 1 tablet (12.5 mg total) by mouth every 6 (six) hours as needed for nausea or vomiting. 30 tablet 0 More than a month at Unknown time  . Simethicone 125 MG TABS Take 2 tablets by mouth daily as needed (gas).    08/02/2015 at Unknown time    Review of Systems  Constitutional: Negative for chills and fever.  Gastrointestinal: Positive for abdominal pain. Negative for constipation, diarrhea, nausea and vomiting.  Genitourinary: Negative for dysuria, frequency and urgency.  Musculoskeletal: Positive for back pain.   Physical Exam   Blood pressure 108/63, pulse 83, temperature 97.7 F (36.5 C), temperature source Oral, resp. rate 20, SpO2 100 %.  Physical Exam  Nursing note and vitals reviewed. Constitutional: She is oriented to person, place, and time. She appears well-developed and well-nourished. No distress.  HENT:  Head: Normocephalic.  Cardiovascular: Normal rate.   Respiratory: Effort normal.  GI: Soft. There is no tenderness. There is no rebound.  Genitourinary:  Genitourinary Comments: Cervix: FT/Thick/ballotable  Neurological: She is alert and oriented to person, place, and time.  Skin: Skin is warm and dry.  Psychiatric: She has a normal mood and affect.    FHT: 135, moderate with 15x15 accels, no decels  Toco: frequent UI with occasional UC.  MAU Course  Procedures  MDM Patient refusing IV  fluids and pain medication. D/W the patient that this may help her feel more comfortable and stop the uterine irritability. Patient refuses.   0238: Patient states that her pain is now 0/10. She is desiring DC home.   Assessment and Plan   1. Braxton Hicks contractions   2. Advanced maternal age in multigravida, third trimester   3. [redacted] weeks gestation of pregnancy    DC home Comfort measures reviewed  3rd Trimester precautions  Fetal kick counts RX: none  Return  to MAU as needed FU with OB as planned  Follow-up Information    [facility],TODD D, MD .   Specialty:  Obstetrics and Gynecology Contact information: [address] [place] [address] [phone]            [name] 12/13/2015, 2:00 AM

## 2015-12-13 NOTE — Discharge Instructions (Signed)

## 2015-12-16 NOTE — Progress Notes (Signed)
FHT from 9-18 reviewed.  Reactive NST, irreg ctx.

## 2015-12-22 LAB — OB RESULTS CONSOLE GBS: STREP GROUP B AG: NEGATIVE

## 2016-01-13 ENCOUNTER — Encounter (HOSPITAL_COMMUNITY): Payer: Self-pay | Admitting: *Deleted

## 2016-01-13 ENCOUNTER — Inpatient Hospital Stay (HOSPITAL_COMMUNITY): Payer: Managed Care, Other (non HMO) | Admitting: Anesthesiology

## 2016-01-13 ENCOUNTER — Inpatient Hospital Stay (HOSPITAL_COMMUNITY)
Admission: AD | Admit: 2016-01-13 | Discharge: 2016-01-15 | DRG: 775 | Disposition: A | Discharge status: Home / Self Care | Payer: Managed Care, Other (non HMO) | Source: Ambulatory Visit | Attending: Obstetrics and Gynecology | Admitting: Obstetrics and Gynecology

## 2016-01-13 DIAGNOSIS — Z3A39 39 weeks gestation of pregnancy: Secondary | ICD-10-CM

## 2016-01-13 DIAGNOSIS — Z6830 Body mass index (BMI) 30.0-30.9, adult: Secondary | ICD-10-CM

## 2016-01-13 DIAGNOSIS — O4292 Full-term premature rupture of membranes, unspecified as to length of time between rupture and onset of labor: Secondary | ICD-10-CM | POA: Diagnosis present

## 2016-01-13 DIAGNOSIS — O99214 Obesity complicating childbirth: Secondary | ICD-10-CM | POA: Diagnosis present

## 2016-01-13 DIAGNOSIS — E669 Obesity, unspecified: Secondary | ICD-10-CM | POA: Diagnosis present

## 2016-01-13 DIAGNOSIS — O09523 Supervision of elderly multigravida, third trimester: Secondary | ICD-10-CM

## 2016-01-13 LAB — CBC
HEMATOCRIT: 32.9 % — AB (ref 36.0–46.0)
HEMOGLOBIN: 11.8 g/dL — AB (ref 12.0–15.0)
MCH: 29.1 pg (ref 26.0–34.0)
MCHC: 35.9 g/dL (ref 30.0–36.0)
MCV: 81.2 fL (ref 78.0–100.0)
Platelets: 235 10*3/uL (ref 150–400)
RBC: 4.05 MIL/uL (ref 3.87–5.11)
RDW: 14.2 % (ref 11.5–15.5)
WBC: 7.2 10*3/uL (ref 4.0–10.5)

## 2016-01-13 LAB — RPR: RPR Ser Ql: NONREACTIVE

## 2016-01-13 LAB — ABO/RH: ABO/RH(D): A POS

## 2016-01-13 LAB — TYPE AND SCREEN
ABO/RH(D): A POS
Antibody Screen: NEGATIVE

## 2016-01-13 LAB — POCT FERN TEST: POCT FERN TEST: POSITIVE

## 2016-01-13 MED ORDER — OXYCODONE HCL 5 MG PO TABS: 10.0000 mg | ORAL_TABLET | ORAL | Status: DC | PRN

## 2016-01-13 MED ORDER — PRENATAL MULTIVITAMIN CH
1.0000 | ORAL_TABLET | Freq: Every day | ORAL | Status: DC
  Administered 2016-01-13 – 2016-01-15 (×3): 1 via ORAL
  Filled 2016-01-13 (×3): qty 1

## 2016-01-13 MED ORDER — ACETAMINOPHEN 325 MG PO TABS: 650.0000 mg | ORAL_TABLET | ORAL | Status: DC | PRN

## 2016-01-13 MED ORDER — OXYCODONE HCL 5 MG PO TABS: 5.0000 mg | ORAL_TABLET | ORAL | Status: DC | PRN

## 2016-01-13 MED ORDER — DIBUCAINE 1 % RE OINT: 1.0000 "application " | TOPICAL_OINTMENT | RECTAL | Status: DC | PRN

## 2016-01-13 MED ORDER — EPHEDRINE 5 MG/ML INJ
10.0000 mg | INTRAVENOUS | Status: DC | PRN
  Filled 2016-01-13: qty 4

## 2016-01-13 MED ORDER — FLEET ENEMA 7-19 GM/118ML RE ENEM: 1.0000 | ENEMA | RECTAL | Status: DC | PRN

## 2016-01-13 MED ORDER — LIDOCAINE HCL (PF) 1 % IJ SOLN
INTRAMUSCULAR | Status: DC | PRN
  Administered 2016-01-13 (×2): 4 mL via EPIDURAL

## 2016-01-13 MED ORDER — MEASLES, MUMPS & RUBELLA VAC ~~LOC~~ INJ
0.5000 mL | INJECTION | Freq: Once | SUBCUTANEOUS | Status: DC
  Filled 2016-01-13: qty 0.5

## 2016-01-13 MED ORDER — MAGNESIUM HYDROXIDE 400 MG/5ML PO SUSP: 30.0000 mL | ORAL | Status: DC | PRN

## 2016-01-13 MED ORDER — DIPHENHYDRAMINE HCL 50 MG/ML IJ SOLN: 12.5000 mg | INTRAMUSCULAR | Status: DC | PRN

## 2016-01-13 MED ORDER — LACTATED RINGERS IV SOLN
500.0000 mL | INTRAVENOUS | Status: DC | PRN
Start: 2016-01-13 — End: 2016-01-13

## 2016-01-13 MED ORDER — TETANUS-DIPHTH-ACELL PERTUSSIS 5-2.5-18.5 LF-MCG/0.5 IM SUSP: 0.5000 mL | Freq: Once | INTRAMUSCULAR | Status: DC

## 2016-01-13 MED ORDER — OXYCODONE-ACETAMINOPHEN 5-325 MG PO TABS: 2.0000 | ORAL_TABLET | ORAL | Status: DC | PRN

## 2016-01-13 MED ORDER — BUTORPHANOL TARTRATE 1 MG/ML IJ SOLN
1.0000 mg | INTRAMUSCULAR | Status: DC | PRN
  Administered 2016-01-13: 1 mg via INTRAVENOUS
  Filled 2016-01-13: qty 1

## 2016-01-13 MED ORDER — LACTATED RINGERS IV SOLN: 500.0000 mL | Freq: Once | INTRAVENOUS | Status: DC

## 2016-01-13 MED ORDER — SIMETHICONE 80 MG PO CHEW: 80.0000 mg | CHEWABLE_TABLET | ORAL | Status: DC | PRN

## 2016-01-13 MED ORDER — OXYTOCIN 40 UNITS IN LACTATED RINGERS INFUSION - SIMPLE MED
2.5000 [IU]/h | INTRAVENOUS | Status: DC
  Filled 2016-01-13: qty 1000

## 2016-01-13 MED ORDER — SOD CITRATE-CITRIC ACID 500-334 MG/5ML PO SOLN: 30.0000 mL | ORAL | Status: DC | PRN

## 2016-01-13 MED ORDER — IBUPROFEN 600 MG PO TABS
600.0000 mg | ORAL_TABLET | Freq: Four times a day (QID) | ORAL | Status: DC
  Administered 2016-01-13 – 2016-01-15 (×7): 600 mg via ORAL
  Filled 2016-01-13 (×9): qty 1

## 2016-01-13 MED ORDER — DIPHENHYDRAMINE HCL 25 MG PO CAPS: 25.0000 mg | ORAL_CAPSULE | Freq: Four times a day (QID) | ORAL | Status: DC | PRN

## 2016-01-13 MED ORDER — TERBUTALINE SULFATE 1 MG/ML IJ SOLN
0.2500 mg | Freq: Once | INTRAMUSCULAR | Status: DC | PRN
  Filled 2016-01-13: qty 1

## 2016-01-13 MED ORDER — PHENYLEPHRINE 40 MCG/ML (10ML) SYRINGE FOR IV PUSH (FOR BLOOD PRESSURE SUPPORT)
80.0000 ug | PREFILLED_SYRINGE | INTRAVENOUS | Status: DC | PRN
  Filled 2016-01-13: qty 10
  Filled 2016-01-13: qty 5

## 2016-01-13 MED ORDER — COCONUT OIL OIL: 1.0000 "application " | TOPICAL_OIL | Status: DC | PRN

## 2016-01-13 MED ORDER — METHYLERGONOVINE MALEATE 0.2 MG/ML IJ SOLN: 0.2000 mg | INTRAMUSCULAR | Status: DC | PRN

## 2016-01-13 MED ORDER — OXYCODONE-ACETAMINOPHEN 5-325 MG PO TABS
1.0000 | ORAL_TABLET | ORAL | Status: DC | PRN
  Administered 2016-01-13: 1 via ORAL
  Filled 2016-01-13: qty 1

## 2016-01-13 MED ORDER — ONDANSETRON HCL 4 MG/2ML IJ SOLN: 4.0000 mg | Freq: Four times a day (QID) | INTRAMUSCULAR | Status: DC | PRN

## 2016-01-13 MED ORDER — PHENYLEPHRINE 40 MCG/ML (10ML) SYRINGE FOR IV PUSH (FOR BLOOD PRESSURE SUPPORT)
80.0000 ug | PREFILLED_SYRINGE | INTRAVENOUS | Status: DC | PRN
Start: 2016-01-13 — End: 2016-01-13
  Filled 2016-01-13: qty 5

## 2016-01-13 MED ORDER — SENNOSIDES-DOCUSATE SODIUM 8.6-50 MG PO TABS
2.0000 | ORAL_TABLET | ORAL | Status: DC
  Administered 2016-01-14: 2 via ORAL
  Filled 2016-01-13 (×3): qty 2

## 2016-01-13 MED ORDER — ONDANSETRON HCL 4 MG/2ML IJ SOLN: 4.0000 mg | INTRAMUSCULAR | Status: DC | PRN

## 2016-01-13 MED ORDER — FENTANYL 2.5 MCG/ML BUPIVACAINE 1/10 % EPIDURAL INFUSION (WH - ANES)
14.0000 mL/h | INTRAMUSCULAR | Status: DC | PRN
  Administered 2016-01-13: 13 mL/h via EPIDURAL
  Filled 2016-01-13: qty 125

## 2016-01-13 MED ORDER — LIDOCAINE HCL (PF) 1 % IJ SOLN
30.0000 mL | INTRAMUSCULAR | Status: DC | PRN
  Filled 2016-01-13: qty 30

## 2016-01-13 MED ORDER — ZOLPIDEM TARTRATE 5 MG PO TABS: 5.0000 mg | ORAL_TABLET | Freq: Every evening | ORAL | Status: DC | PRN

## 2016-01-13 MED ORDER — METHYLERGONOVINE MALEATE 0.2 MG PO TABS: 0.2000 mg | ORAL_TABLET | ORAL | Status: DC | PRN

## 2016-01-13 MED ORDER — LACTATED RINGERS IV SOLN
INTRAVENOUS | Status: DC
  Administered 2016-01-13: 08:00:00 via INTRAVENOUS

## 2016-01-13 MED ORDER — SODIUM BICARBONATE 8.4 % IV SOLN
INTRAVENOUS | Status: DC | PRN
  Administered 2016-01-13: 5 mL via EPIDURAL

## 2016-01-13 MED ORDER — OXYTOCIN BOLUS FROM INFUSION
500.0000 mL | Freq: Once | INTRAVENOUS | Status: AC
Start: 2016-01-13 — End: 2016-01-13
  Administered 2016-01-13: 500 mL via INTRAVENOUS

## 2016-01-13 MED ORDER — BENZOCAINE-MENTHOL 20-0.5 % EX AERO
1.0000 "application " | INHALATION_SPRAY | CUTANEOUS | Status: DC | PRN
  Filled 2016-01-13: qty 56

## 2016-01-13 MED ORDER — WITCH HAZEL-GLYCERIN EX PADS: 1.0000 "application " | MEDICATED_PAD | CUTANEOUS | Status: DC | PRN

## 2016-01-13 MED ORDER — OXYTOCIN 40 UNITS IN LACTATED RINGERS INFUSION - SIMPLE MED: 1.0000 m[IU]/min | INTRAVENOUS | Status: DC

## 2016-01-13 MED ORDER — ONDANSETRON HCL 4 MG PO TABS: 4.0000 mg | ORAL_TABLET | ORAL | Status: DC | PRN

## 2016-01-13 MED ORDER — ACETAMINOPHEN 325 MG PO TABS
650.0000 mg | ORAL_TABLET | ORAL | Status: DC | PRN
  Administered 2016-01-14: 650 mg via ORAL
  Filled 2016-01-13: qty 2

## 2016-01-13 NOTE — MAU Note (Signed)
Pt reports ROM @ 2300 and UC's since 0015

## 2016-01-13 NOTE — MAU Note (Signed)
PT  SAYS  [facility]      AT 2300- [place]  POP.       ON Monday-  VE  3 -4 CM  -  STRIPPED  MEMBRANES.         DENIES HSV -  AND  MRSA.   GBS- NEG

## 2016-01-13 NOTE — Anesthesia Postprocedure Evaluation (Signed)
Anesthesia Post Note  Patient: Selena Scott  Procedure(s) Performed: * No procedures listed *  Patient location during evaluation: Mother Baby Anesthesia Type: Epidural Level of consciousness: awake and alert and oriented Pain management: pain level controlled Vital Signs Assessment: post-procedure vital signs reviewed and stable Respiratory status: spontaneous breathing Cardiovascular status: blood pressure returned to baseline and stable Postop Assessment: no headache, no backache, epidural receding, patient able to bend at knees, no signs of nausea or vomiting and adequate PO intake Anesthetic complications: no Comments: Pt states numbness of right leg between knee and thigh.  Not sensitive to cold or sharp.  Feels pressure.  Good bilateral foot strength.  OOB without problems     Last Vitals:  Vitals:   01/13/16 1210 01/13/16 1640  BP: 93/61 (!) 100/54  Pulse: 70 72  Resp: 18 18  Temp: 36.5 C 36.7 C    Last Pain:  Vitals:   01/13/16 1640  TempSrc: Oral  PainSc: 3    Pain Goal: Patients Stated Pain Goal: 3 (01/13/16 1640)               Jerod Mcquain J

## 2016-01-13 NOTE — H&P (Signed)
Selena Scott is a 35 y.o. female [phone] at 39+ with ROM - gross ROM and light meconium noted in mAU.  PNC complicated by AMA, pt also with HSV 1 on suppression.  Elevated AFP - referred to MFM - nl Panorama  OB History    Gravida Para Term Preterm AB Living   3 2 2     2    SAB TAB Ectopic Multiple Live Births           2    G1and 2 TAB G3 09/1998 7#12 female, SVD G4 05/2006 7#10 female, SVD G5 present  H/o +HPV, last pap pap WNL, HR HPV neg H/o Chl and HSV  Past Medical History:  Diagnosis Date  . Fibroid   . HPV (human papilloma virus) anogenital infection   . Medical history non-contributory    Past Surgical History:  Procedure Laterality Date  . NO PAST SURGERIES    TAB x 2  Family History: family history is not on file. Social History:  reports that she has never smoked. She has never used smokeless tobacco. She reports that she does not drink alcohol or use drugs.   Meds Pepcid, PNV, Valtrex All NKDA     Maternal Diabetes: No Genetic Screening: Abnormal:  Results: Elevated AFP Maternal Ultrasounds/Referrals: Normal Fetal Ultrasounds or other Referrals:  Referred to Materal Fetal Medicine  Maternal Substance Abuse:  No Significant Maternal Medications:  None Significant Maternal Lab Results:  Lab values include: Group B Strep negative Other Comments:  nl panorama  Review of Systems  Constitutional: Negative.   HENT: Negative.   Eyes: Negative.   Respiratory: Negative.   Cardiovascular: Negative.   Gastrointestinal: Negative.   Genitourinary: Negative.   Musculoskeletal: Positive for back pain.  Skin: Negative.   Neurological: Negative.   Psychiatric/Behavioral: Negative.    Maternal Medical History:  Reason for admission: Rupture of membranes.   Contractions: Frequency: irregular.    Fetal activity: Perceived fetal activity is normal.    Prenatal Complications - Diabetes: none.    Dilation: 4 Effacement (%): 80 Station: -3 Exam by:: BRIDGETT,  RN Blood pressure 112/71, pulse 78, temperature 97.9 F (36.6 C), temperature source Oral, height 5\' 3"  (1.6 m), weight 77.2 kg (170 lb 2 oz). Maternal Exam:  Abdomen: Patient reports no abdominal tenderness. Fundal height is appropriate for gestation.   Estimated fetal weight is 7.5-8.5#.   Fetal presentation: vertex  Introitus: Normal vulva. Normal vagina.  Amniotic fluid character: meconium stained.  Cervix: Cervix evaluated by digital exam.     Physical Exam  Constitutional: She is oriented to person, place, and time. She appears well-developed and well-nourished.  HENT:  Head: Normocephalic and atraumatic.  Cardiovascular: Normal rate and regular rhythm.   Respiratory: Effort normal and breath sounds normal. No respiratory distress. She has no wheezes.  GI: Soft. Bowel sounds are normal. She exhibits no distension. There is no tenderness.  Musculoskeletal: Normal range of motion.  Neurological: She is alert and oriented to person, place, and time.  Skin: Skin is warm and dry.  Psychiatric: She has a normal mood and affect. Her behavior is normal.    Prenatal labs: ABO, Rh: A/Positive/-- (04/06 0000) Antibody: Negative (04/06 0000) Rubella: Immune (04/06 0000) RPR: Nonreactive (04/06 0000)  HBsAg: Negative (04/06 0000)  HIV: Non-reactive (04/06 0000)  GBS: Negative (09/27 0000)   Hgb 13.0/Plt 278/Chl neg/GC neg/Varicella immune/Hgb electro WNL/CF neg/glucola 96/HSV +  Nl NT [place] nl anat, female, ant plac  AFP elevated risk of  DS - MFM counseling - nl panorama  Assessment/Plan: 35yo [id number] at 39+ with ROM Augment with pitocin Epidural prn Expect SVD   Bovard-Stuckert, Goran Olden 01/13/2016, 2:07 AM

## 2016-01-13 NOTE — Anesthesia Procedure Notes (Signed)
Epidural Patient location during procedure: OB Start time: 01/13/2016 6:00 AM  Staffing Anesthesiologist: Josephine Igo Performed: anesthesiologist   Preanesthetic Checklist Completed: patient identified, site marked, surgical consent, pre-op evaluation, timeout performed, IV checked, risks and benefits discussed and monitors and equipment checked  Epidural Patient position: sitting Prep: site prepped and draped and DuraPrep Patient monitoring: continuous pulse ox and blood pressure Approach: midline Location: L3-L4 Injection technique: LOR air  Needle:  Needle type: Tuohy  Needle gauge: 17 G Needle length: 9 cm and 9 Needle insertion depth: 5 cm cm Catheter type: closed end flexible Catheter size: 19 Gauge Catheter at skin depth: 10 cm Test dose: negative and Other  Assessment Events: blood not aspirated, injection not painful, no injection resistance, negative IV test and no paresthesia  Additional Notes Patient identified. Risks and benefits discussed including failed block, incomplete  Pain control, post dural puncture headache, nerve damage, paralysis, blood pressure Changes, nausea, vomiting, reactions to medications-both toxic and allergic and post Partum back pain. All questions were answered. Patient expressed understanding and wished to proceed. Sterile technique was used throughout procedure. Epidural site was Dressed with sterile barrier dressing. No paresthesias, signs of intravascular injection Or signs of intrathecal spread were encountered.  Patient was more comfortable after the epidural was dosed. Please see RN's note for documentation of vital signs and FHR which are stable.

## 2016-01-13 NOTE — Anesthesia Pain Management Evaluation Note (Signed)
  CRNA Pain Management Visit Note  Patient: Selena Scott, 35 y.o., female  "Hello I am a member of the anesthesia team at [facility]. We have an anesthesia team available at all times to provide care throughout the hospital, including epidural management and anesthesia for C-section. I don't know your plan for the delivery whether it a natural birth, water birth, IV sedation, nitrous supplementation, doula or epidural, but we want to meet your pain goals."   1.Was your pain managed to your expectations on prior hospitalizations?   Unable to assess - patient sleeping  2.What is your expectation for pain management during this hospitalization?     Unable to assess, patient sleeping, appears comfortable  3.How can we help you reach that goal? Nursing comfort measures, Labor plan and medications as requested when awakens  Record the patient's initial score and the patient's pain goal.   Pain: Patient sleeping - unable to assess  Pain Goal: Patient sleeping - unable to assess [facility] wants you to be able to say your pain was always managed very well.  [facility] 01/13/2016

## 2016-01-13 NOTE — Anesthesia Preprocedure Evaluation (Signed)
Anesthesia Evaluation  Patient identified by MRN, date of birth, ID band Patient awake    Reviewed: Allergy & Precautions, H&P , Patient's Chart, lab work & pertinent test results  Airway Mallampati: II  TM Distance: >3 FB Neck ROM: full    Dental no notable dental hx. (+) Teeth Intact   Pulmonary neg pulmonary ROS,    Pulmonary exam normal breath sounds clear to auscultation       Cardiovascular negative cardio ROS Normal cardiovascular exam Rhythm:regular Rate:Normal     Neuro/Psych negative neurological ROS  negative psych ROS   GI/Hepatic negative GI ROS, Neg liver ROS,   Endo/Other  Obesity  Renal/GU negative Renal ROS  negative genitourinary   Musculoskeletal   Abdominal   Peds  Hematology negative hematology ROS (+) anemia ,   Anesthesia Other Findings   Reproductive/Obstetrics (+) Pregnancy                             Anesthesia Physical Anesthesia Plan  ASA: II  Anesthesia Plan: Epidural   Post-op Pain Management:    Induction:   Airway Management Planned:   Additional Equipment:   Intra-op Plan:   Post-operative Plan:   Informed Consent: I have reviewed the patients History and Physical, chart, labs and discussed the procedure including the risks, benefits and alternatives for the proposed anesthesia with the patient or authorized representative who has indicated his/her understanding and acceptance.     Plan Discussed with: Anesthesiologist  Anesthesia Plan Comments:         Anesthesia Quick Evaluation

## 2016-01-14 NOTE — Anesthesia Postprocedure Evaluation (Signed)
Anesthesia Post Note  Patient: Selena Scott  Procedure(s) Performed: * No procedures listed *  Patient location during evaluation: Mother Baby Anesthesia Type: Epidural Level of consciousness: awake and alert, oriented and patient cooperative Pain management: pain level controlled Vital Signs Assessment: post-procedure vital signs reviewed and stable Respiratory status: spontaneous breathing Cardiovascular status: stable Postop Assessment: no headache, epidural receding, patient able to bend at knees and no signs of nausea or vomiting Anesthetic complications: no Comments: Stated pain score 1-2.     Last Vitals:  Vitals:   01/13/16 1640 01/14/16 0040  BP: (!) 100/54 (!) 104/53  Pulse: 72 72  Resp: 18 20  Temp: 36.7 C 36.9 C    Last Pain:  Vitals:   01/14/16 0130  TempSrc:   PainSc: 3    Pain Goal: Patients Stated Pain Goal: 3 (01/14/16 0130)               Rico Sheehan

## 2016-01-14 NOTE — Addendum Note (Signed)
Addendum  created 01/14/16 0805 by Garner Nash, CRNA   Charge Capture section accepted, Sign clinical note

## 2016-01-14 NOTE — Progress Notes (Signed)
Patient ID: Selena Scott, female   DOB: November 16, 1980, 35 y.o.   MRN: [id number] PPD#1 Pt doing well. Pain controlled with ibuprofen. Lochia mild. Voiding well. Ambulating with no issues. Breastfeeding and bonding well with baby. No complaints VSS ABD-soft, ND, NTTP EXT- no homans  A/P: PPD#1 s/p svd- stable         Routine pp care         Discharge to home tomorrow

## 2016-01-14 NOTE — Progress Notes (Signed)
Chaplain paged to pt room. Ms Khosla and family was not aware of why chaplain was paged, but shared with chaplain the joy of the birth of their son.   Sallee Lange. Reeya Bound, [place]

## 2016-01-15 MED ORDER — IBUPROFEN 600 MG PO TABS: 600.0000 mg | ORAL_TABLET | Freq: Four times a day (QID) | ORAL | 1 refills | Status: DC | PRN

## 2016-01-15 NOTE — Discharge Instructions (Signed)
Nothing in vagina for 6 weeks.  No sex, tampons, and douching.  Other instructions as in [facility] Discharge Booklet. °

## 2016-01-15 NOTE — Discharge Summary (Signed)
OB Discharge Summary     Patient Name: Selena Scott DOB: [birth year] MRN: [id number]  Date of admission: 01/13/2016 Delivering MD: Willis Modena, TODD   Date of discharge: 01/15/2016  Admitting diagnosis: 64 WEEKS ROM  Intrauterine pregnancy: [redacted]w[redacted]d     Secondary diagnosis:  Active Problems:   Indication for care in labor or delivery   SVD (spontaneous vaginal delivery)  Additional problems: none     Discharge diagnosis: Term Pregnancy Delivered                                                                                                Post partum procedures:none  Augmentation: Pitocin  Complications: None  Hospital course:  Onset of Labor With Vaginal Delivery     35 y.o. yo G3P3003 at [redacted]w[redacted]d was admitted in Latent Labor on 01/13/2016. Patient had an uncomplicated labor course as follows:  Membrane Rupture Time/Date: 11:00 PM ,01/12/2016   Intrapartum Procedures: Episiotomy: None [1]                                         Lacerations:  1st degree [2]  Patient had a delivery of a Viable infant. 01/13/2016  Information for the patient's newborn:  Masen, Windell [id number]  Delivery Method: Vaginal, Spontaneous Delivery (Filed from Delivery Summary)    Pateint had an uncomplicated postpartum course.  She is ambulating, tolerating a regular diet, passing flatus, and urinating well. Patient is discharged home in stable condition on 01/15/16.    Physical exam Vitals:   01/14/16 0040 01/14/16 0900 01/14/16 1813 01/15/16 0600  BP: (!) 104/53 (!) 94/52 (!) 111/52 98/64  Pulse: 72 70 76 63  Resp: 20 20 18 18   Temp: 98.4 F (36.9 C) 98.1 F (36.7 C)  97.9 F (36.6 C)  TempSrc: Oral Oral  Oral  SpO2:      Weight:      Height:       General: alert, cooperative and no distress Lochia: appropriate Uterine Fundus: firm Incision: N/A DVT Evaluation: No evidence of DVT seen on physical exam. Negative Homan's sign. Labs: Lab Results  Component Value Date   WBC 7.2  01/13/2016   HGB 11.8 (L) 01/13/2016   HCT 32.9 (L) 01/13/2016   MCV 81.2 01/13/2016   PLT 235 01/13/2016   CMP Latest Ref Rng & Units 08/03/2015  Glucose 65 - 99 mg/dL 83  BUN 6 - 20 mg/dL 5(L)  Creatinine 0.44 - 1.00 mg/dL 0.47  Sodium 135 - 145 mmol/L 135  Potassium 3.5 - 5.1 mmol/L 3.7  Chloride 101 - 111 mmol/L 105  CO2 22 - 32 mmol/L 20(L)  Calcium 8.9 - 10.3 mg/dL 9.0  Total Protein 6.5 - 8.1 g/dL 6.4(L)  Total Bilirubin 0.3 - 1.2 mg/dL 0.9  Alkaline Phos 38 - 126 U/L 57  AST 15 - 41 U/L 35  ALT 14 - 54 U/L 26    Discharge instruction: per After Visit Summary and "Baby and Me Booklet".  After visit meds:  Medication List    STOP taking these medications   PRESCRIPTION MEDICATION     TAKE these medications   ibuprofen 600 MG tablet Commonly known as:  ADVIL,MOTRIN Take 1 tablet (600 mg total) by mouth every 6 (six) hours as needed for headache, mild pain, moderate pain or cramping.       Diet: routine diet  Activity: Advance as tolerated. Pelvic rest for 6 weeks.   Outpatient follow up:6 weeks Follow up Appt:No future appointments. Follow up Visit:No Follow-up on file.  Postpartum contraception: Undecided  Newborn Data: Live born female  Birth Weight: 7 lb 6.7 oz (3365 g) APGAR: 9, 9  Baby Feeding: Bottle Disposition:home with mother   01/15/2016 [place], DO

## 2016-01-15 NOTE — Progress Notes (Signed)
Patient ID: Selena Scott, female   DOB: Nov 06, 1980, 35 y.o.   MRN: [id number] Pt doing well. Mild headache this am but tolerable. No fever or chills. Breats engorged- trying to dry up using cabbage leaves- does not plan on breastfeeding. Bonding well with baby. Ready for discharge to home today VSS ABD-FF  EXT- no swelling or homans  A/P: PPD#2 s/p svd - stable         PP appt made         Discharge instructions reviewed         Unsure birth control

## 2016-09-06 ENCOUNTER — Other Ambulatory Visit: Payer: Self-pay | Admitting: Family Medicine

## 2016-09-06 DIAGNOSIS — E049 Nontoxic goiter, unspecified: Secondary | ICD-10-CM

## 2016-10-04 ENCOUNTER — Other Ambulatory Visit: Payer: Managed Care, Other (non HMO)

## 2016-10-04 ENCOUNTER — Ambulatory Visit
Admission: RE | Admit: 2016-10-04 | Discharge: 2016-10-04 | Disposition: A | Discharge status: Home / Self Care | Payer: Managed Care, Other (non HMO) | Source: Ambulatory Visit | Attending: Family Medicine | Admitting: Family Medicine

## 2016-10-04 DIAGNOSIS — E049 Nontoxic goiter, unspecified: Secondary | ICD-10-CM

## 2016-11-17 ENCOUNTER — Ambulatory Visit (INDEPENDENT_AMBULATORY_CARE_PROVIDER_SITE_OTHER): Payer: Managed Care, Other (non HMO) | Admitting: Endocrinology

## 2016-11-17 ENCOUNTER — Encounter: Payer: Self-pay | Admitting: Endocrinology

## 2016-11-17 DIAGNOSIS — E042 Nontoxic multinodular goiter: Secondary | ICD-10-CM

## 2016-11-17 NOTE — Patient Instructions (Signed)
Please see an ear-nose-throat specialist.  you will receive a phone call, about a day and time for an appointment. Until then, take prilosec OTC 1 pill per day.

## 2016-11-17 NOTE — Progress Notes (Signed)
Subjective:    Patient ID: Selena Scott, female    DOB: 09/19/1980, 36 y.o.   MRN: [id number]  HPI Pt is referred by Levin Bacon, NP,for nodular thyroid.  Pt was noted to have a nodular thyroid in 2018.  she is unaware of ever having had thyroid problems in the past.  He has no h/o XRT or surgery to the neck.  She reports 18 mos of slight swelling of the neck, and assoc sputum production.   Past Medical History:  Diagnosis Date  . Fibroid   . HPV (human papilloma virus) anogenital infection   . Medical history non-contributory     Past Surgical History:  Procedure Laterality Date  . NO PAST SURGERIES      Social History   Social History  . Marital status: Married    Spouse name: N/A  . Number of children: N/A  . Years of education: N/A   Occupational History  . Not on file.   Social History Main Topics  . Smoking status: Never Smoker  . Smokeless tobacco: Never Used  . Alcohol use No  . Drug use: No  . Sexual activity: Yes   Other Topics Concern  . Not on file   Social History Narrative  . No narrative on file    Current Outpatient Prescriptions on File Prior to Visit  Medication Sig Dispense Refill  . ibuprofen (ADVIL,MOTRIN) 600 MG tablet Take 1 tablet (600 mg total) by mouth every 6 (six) hours as needed for headache, mild pain, moderate pain or cramping. (Patient not taking: Reported on 11/17/2016) 40 tablet 1   No current facility-administered medications on file prior to visit.     No Known Allergies  Family History  Problem Relation Age of Onset  . Thyroid disease Neg Hx     BP 110/70   Pulse 73   Wt 156 lb 3.2 oz (70.9 kg)   LMP 10/09/2016 (Approximate)   SpO2 98%   BMI 27.67 kg/m    Review of Systems Denies weight change, hoarseness, visual loss, chest pain, sob, dysphagia, diarrhea, itching, flushing, easy bruising, depression, cold intolerance, headache, and numbness.  She has rhinorrhea.      Objective:   Physical Exam VS: see vs  page GEN: no distress HEAD: head: no deformity eyes: no periorbital swelling, no proptosis external nose and ears are normal mouth: no lesion seen NECK: thyroid is approx 5 times normal size, with multinodular surface CHEST WALL: no deformity LUNGS: clear to auscultation CV: reg rate and rhythm, no murmur ABD: abdomen is soft, nontender.  no hepatosplenomegaly.  not distended.  no hernia MUSCULOSKELETAL: muscle bulk and strength are grossly normal.  no obvious joint swelling.  gait is normal and steady EXTEMITIES: no deformity.  no edema PULSES: no carotid bruit NEURO:  cn 2-12 grossly intact.   readily moves all 4's.  sensation is intact to touch on all 4's.  No tremor SKIN:  Normal texture and temperature.  No rash or suspicious lesion is visible.  Not diaphoretic NODES:  None palpable at the neck PSYCH: alert, well-oriented.  Does not appear anxious nor depressed.   outside test results are reviewed:  TSH=0.44  [place]: multinodular goiter, with 2 nodules meeting criteria for bx.  I personally reviewed electrocardiogram tracing (08/03/15): Indication: n/v Impression: NSR.  No MI.  No hypertrophy. No comparison is available     Assessment & Plan:  We requested more labs from [facility]: pt feels this is related to  the thyroid, but GERD is more likely.    Patient Instructions  Please see an ear-nose-throat specialist.  you will receive a phone call, about a day and time for an appointment. Until then, take prilosec OTC 1 pill per day.

## 2016-11-20 ENCOUNTER — Telehealth: Payer: Self-pay | Admitting: Endocrinology

## 2016-11-20 NOTE — Telephone Encounter (Signed)
Patient would like to speak to supervisor about transferring providers. Patient would like to see Dr. Cruzita Lederer instead of Dr. Loanne Drilling due to being unpleased by the service received by Dr. Loanne Drilling. Call [phone] to advise.

## 2016-11-24 NOTE — Telephone Encounter (Signed)
OK with me.

## 2016-11-24 NOTE — Telephone Encounter (Signed)
Pt is wanting to switch to Dr. Cruzita Lederer please advise

## 2016-11-24 NOTE — Telephone Encounter (Signed)
OK 

## 2016-12-04 NOTE — Telephone Encounter (Signed)
Awaiting a call back from the pt to see if she can make the appt available on 9/12

## 2016-12-06 ENCOUNTER — Encounter: Payer: Self-pay | Admitting: Internal Medicine

## 2016-12-06 ENCOUNTER — Other Ambulatory Visit (INDEPENDENT_AMBULATORY_CARE_PROVIDER_SITE_OTHER): Payer: Managed Care, Other (non HMO)

## 2016-12-06 ENCOUNTER — Ambulatory Visit (INDEPENDENT_AMBULATORY_CARE_PROVIDER_SITE_OTHER): Payer: Managed Care, Other (non HMO) | Admitting: Internal Medicine

## 2016-12-06 VITALS — BP 110/62 | HR 92 | Wt 156.0 lb

## 2016-12-06 DIAGNOSIS — R946 Abnormal results of thyroid function studies: Secondary | ICD-10-CM | POA: Diagnosis not present

## 2016-12-06 DIAGNOSIS — E042 Nontoxic multinodular goiter: Secondary | ICD-10-CM | POA: Diagnosis not present

## 2016-12-06 DIAGNOSIS — R7989 Other specified abnormal findings of blood chemistry: Secondary | ICD-10-CM

## 2016-12-06 LAB — TSH: TSH: 0.67 u[IU]/mL (ref 0.35–4.50)

## 2016-12-06 LAB — T3, FREE: T3, Free: 3.8 pg/mL (ref 2.3–4.2)

## 2016-12-06 LAB — T4, FREE: FREE T4: 0.75 ng/dL (ref 0.60–1.60)

## 2016-12-06 NOTE — Patient Instructions (Addendum)
Please stop at the lab.  Please come back for a follow-up appointment in 3 months.   Thyroid Nodule A thyroid nodule is an isolatedgrowth of thyroid cells that forms a lump in your thyroid gland. The thyroid gland is a butterfly-shaped gland. It is found in the lower front of your neck. This gland sends chemical messengers (hormones) through your blood to all parts of your body. These hormones are important in regulating your body temperature and helping your body to use energy. Thyroid nodules are common. Most are not cancerous (are benign). You may have one nodule or several nodules. Different types of thyroid nodules include:  Nodules that grow and fill with fluid (thyroid cysts).  Nodules that produce too much thyroid hormone (hot nodules or hyperthyroid).  Nodules that produce no thyroid hormone (cold nodules or hypothyroid).  Nodules that form from cancer cells (thyroid cancers).  What are the causes? Usually, the cause of this condition is not known. What increases the risk? Factors that make this condition more likely to develop include:  Increasing age. Thyroid nodules become more common in people who are older than 38 years of age.  Gender. ? Benign thyroid nodules are more common in women. ? Cancerous (malignant) thyroid nodules are more common in men.  A family history that includes: ? Thyroid nodules. ? Pheochromocytoma. ? Thyroid carcinoma. ? Hyperparathyroidism.  Certain kinds of thyroid diseases, such as Hashimoto thyroiditis.  Lack of iodine.  A history of head and neck radiation, such as from X-rays.  What are the signs or symptoms? It is common for this condition to cause no symptoms. If you have symptoms, they may include:  A lump in your lower neck.  Feeling a lump or tickle in your throat.  Pain in your neck, jaw, or ear.  Having trouble swallowing.  Hot nodules may cause symptoms that include:  Weight loss.  Warm, flushed skin.  Feeling  hot.  Feeling nervous.  A racing heartbeat.  Cold nodules may cause symptoms that include:  Weight gain.  Dry skin.  Brittle hair. This may also occur with hair loss.  Feeling cold.  Fatigue.  Thyroid cancer nodules may cause symptoms that include:  Hard nodules that feel stuck to the thyroid gland.  Hoarseness.  Lumps in the glands near your thyroid (lymph nodes).  How is this diagnosed? A thyroid nodule may be felt by your health care provider during a physical exam. This condition may also be diagnosed based on your symptoms. You may also have tests, including:  An ultrasound. This may be done to confirm the diagnosis.  A biopsy. This involves taking a sample from the nodule and looking at it under a microscope to see if the nodule is benign.  Blood tests to make sure that your thyroid is working properly.  Imaging tests such as MRI or CT scan may be done if: ? Your nodule is large. ? Your nodule is blocking your airway. ? Cancer is suspected.  How is this treated? Treatment depends on the cause and size of your nodule or nodules. If the nodule is benign, treatment may not be necessary. Your health care provider may monitor the nodule to see if it goes away without treatment. If the nodule continues to grow, is cancerous, or does not go away:  It may need to be drained with a needle.  It may need to be removed with surgery.  If you have surgery, part or all of your thyroid gland may need  to be removed as well. Follow these instructions at home:  Pay attention to any changes in your nodule.  Take over-the-counter and prescription medicines only as told by your health care provider.  Keep all follow-up visits as told by your health care provider. This is important. Contact a health care provider if:  Your voice changes.  You have trouble swallowing.  You have pain in your neck, ear, or jaw that is getting worse.  Your nodule gets bigger.  Your nodule  starts to make it harder for you to breathe. Get help right away if:  You have a sudden fever.  You feel very weak.  Your muscles look like they are shrinking (muscle wasting).  You have mood swings.  You feel very restless.  You feel confused.  You are seeing or hearing things that other people do not see or hear (having hallucinations).  You feel suddenly nauseous or throw up.  You suddenly have diarrhea.  You have chest pain.  There is a loss of consciousness. This information is not intended to replace advice given to you by your health care provider. Make sure you discuss any questions you have with your health care provider. Document Released: 02/04/2004 Document Revised: 11/14/2015 Document Reviewed: 06/24/2014 Elsevier Interactive Patient Education  2018 [employer].

## 2016-12-06 NOTE — Progress Notes (Addendum)
Patient ID: Selena Scott, female   DOB: 01-01-1981, 36 y.o.   MRN: [id number]    HPI  Selena Scott is a 36 y.o.-year-old female, referred by her PCP, Levin Bacon, NP for evaluation and treatment for multiple thyroid nodules. She previously saw Dr. Loanne Drilling, last visit with him <1 mo ago. She is here for a second opinion.  She started to feel neck enlargement and pressure 8 mo ago, at the beginning of her pregnancy. This persisted even after the birth of her baby in 12/2016. She is gagging on saliva, coughing and snoring. She presented to see PCP and an U/S was obtained.  I reviewed the report and I also personally reviewed the images of her thyroid ultrasound (10/04/2016): Thyroid is markedly heterogeneous, with the following nodules: Right lobe: 1. Right mid nodule 2.1 x 2.0 x 1.2 cm, solid, isoechoic 2. Right mid nodule 1.9 x 1.4 x 1.2 cm, solid, isoechoic 3. Right inferior nodule 2.4 x 2.3 x 2.0 cm, solid, isoechoic Left lobe: 1. Left mid nodule 2.4 x 1.7 x 2.1 cm, solid, hypoechoic-indication for biopsy 2. Left superior nodule 5.1 x 3.2 x 4.2 cm, solid, isoechoic-indication for biopsy 3. Left inferior nodule 1.8 x 1.5 x 1.4 cm, solid, isoechoic  Pt c/o: - + feeling nodules in neck - + cough  - given PPIs >> no relief - no hoarseness, but strangulated voice - + dysphagia - + choking - + snoring - SOB with lying down  I reviewed pt's thyroid tests: - per Dr. Cordelia Pen note: TSH=0.44  No results found for: TSH, FREET4   Pt c/o: - + weight gain - + palpitations  But no: - no fatigue - no heat intolerance/cold intolerance - no tremors - no anxiety/depression - no hyperdefecation/constipation - no hair loss  No FH of thyroid ds. No FH of thyroid cancer. No h/o radiation tx to head or neck.  No seaweed or kelp. No recent contrast studies. No steroid use. No herbal supplements. No Biotin supplements or Hair, Skin and Nails vitamins.  ROS: Constitutional: + see HPI Eyes: no  blurry vision, no xerophthalmia ENT: no sore throat, + see HPI Cardiovascular: no CP/SOB/palpitations/leg swelling Respiratory: no cough/SOB Gastrointestinal: no N/V/D/C Musculoskeletal: no muscle/joint aches Skin: no rashes Neurological: no tremors/numbness/tingling/dizziness Psychiatric: no depression/anxiety  Past Medical History:  Diagnosis Date  . Fibroid   . HPV (human papilloma virus) anogenital infection   . Medical history non-contributory    Past Surgical History:  Procedure Laterality Date  . NO PAST SURGERIES     Social History   Social History  . Marital status: Married    Spouse name: N/A  . Number of children: 3   Occupational History  . HR supervisor   Social History Main Topics  . Smoking status: Never Smoker  . Smokeless tobacco: Never Used  . Alcohol use No  . Drug use: No   Current Outpatient Prescriptions on File Prior to Visit  Medication Sig Dispense Refill  . ibuprofen (ADVIL,MOTRIN) 600 MG tablet Take 1 tablet (600 mg total) by mouth every 6 (six) hours as needed for headache, mild pain, moderate pain or cramping. (Patient not taking: Reported on 11/17/2016) 40 tablet 1   No current facility-administered medications on file prior to visit.    No Known Allergies Family History  Problem Relation Age of Onset  . Thyroid disease Neg Hx     PE: BP 110/62 (BP Location: Left Arm, Patient Position: Sitting)   Pulse 92   Wt  156 lb (70.8 kg)   LMP 11/20/2016   SpO2 99%   BMI 27.63 kg/m  Wt Readings from Last 3 Encounters:  12/06/16 156 lb (70.8 kg)  11/17/16 156 lb 3.2 oz (70.9 kg)  01/13/16 170 lb 2 oz (77.2 kg)   Constitutional: overweight, in NAD Eyes: PERRLA, EOMI, no exophthalmos ENT: moist mucous membranes, + thyromegaly L>R (the L nodule palpated up to the L mandible angle), no cervical lymphadenopathy Cardiovascular: tachycardia, RR, No MRG Respiratory: CTA B Gastrointestinal: abdomen soft, NT, ND, BS+ Musculoskeletal: no  deformities, strength intact in all 4;  Skin: moist, warm, no rashes Neurological: no tremor with outstretched hands, DTR normal in all 4  ASSESSMENT: 1. Multiple thyroid nodules  2. Low TSH  PLAN: 1. Multiple thyroid nodules - I reviewed the report of her thyroid ultrasound along with the patient and her husband. I pointed out that the nodules are large, this being a risk factor for cancer. The largest nodule is 5.1 cm in largest dimension and it is conceivable that it is causing her compression sxs. The L inferior nodule is close to her esophagus, and I believe it could cause dysphagia. We may be able to check a barium swallowing study, however, her left thyroid nodules are so large, that is conceivable that she has problems swallowing or sleeping. - Out of the 6 nodules, 2 are recommended to be Bx'ed: these are large, solid and hypoechoic. The rest of the nodules are isoechoic, lower risk for cancer. Otherwise, all the nodules are: - without microcalcifications - without internal blood flow - more wide than tall - well delimited from surrounding tissue Pt does not have a thyroid cancer family history or a personal history of RxTx to head/neck. All these would favor benignity.  - the only way that we can tell exactly if the nodules are cancerous or not is by doing a thyroid biopsy (FNA). She agrees to have this done. - I explained that cancer and neck compression symptoms could be indications for total thyroidectomy. We will await the results of the biopsy first. - We did discuss that if we go ahead with total thyroidectomy, she will need thyroid hormones for the rest of her life. - They did inquire about other options to destroy the thyroid nodules, but I explained that we can only do as in all ablation for cystic nodules and usually radiofrequency ablation for solid nodules, but this is not offered in our area and this requires total anesthesia. - We reviewed together her previous TSH  results, which was slightly low. We'll repeat TFTs today, along with thyroid antibodies. If the TSH is suppressed, we may need to check an uptake and scan first.  2. Low TSH - reviewed prev TSH - we will recheck today: Orders Placed This Encounter  Procedures  . TSH  . T4, free  . T3, free  . Thyroid peroxidase antibody  . Thyroglobulin antibody  - we may need a thyroid uptake and scan as mentioned above  - time spent with the patient and her husband: 40 min, of which >50% was spent in obtaining information about her symptoms, reviewing her previous labs, reports, evaluations, and treatments, counseling her about her condition (please see the discussed topics above), and developing a plan to further investigate and treat it; they had a number of questions which I addressed.   Lab on 12/06/2016  Component Date Value Ref Range Status  . TSH 12/06/2016 0.67  0.35 - 4.50 uIU/mL Final  .  Free T4 12/06/2016 0.75  0.60 - 1.60 ng/dL Final   Comment: Specimens from patients who are undergoing biotin therapy and /or ingesting biotin supplements may contain high levels of biotin.  The higher biotin concentration in these specimens interferes with this Free T4 assay.  Specimens that contain high levels  of biotin may cause false high results for this Free T4 assay.  Please interpret results in light of the total clinical presentation of the patient.    . T3, Free 12/06/2016 3.8  2.3 - 4.2 pg/mL Final  . Thyroperoxidase Ab SerPl-aCnc 12/06/2016 <1  <9 IU/mL Final  . Thyroglobulin Ab 12/06/2016 <1  < or = 1 IU/mL Final   All labs normal. Will go ahead with the thyroid nodule Bx's.  Adequacy Reason Satisfactory For Evaluation. Diagnosis THYROID, FINE NEEDLE ASPIRATION, LEFT LOBE, LMP (SPECIMEN 1 OF 2 COLLECTED 12-28-2016) CONSISTENT WITH BENIGN FOLLICULAR NODULE (BETHESDA CATEGORY II). Enid Cutter MD Pathologist, Electronic Signature (Case signed 12/29/2016) Specimen Clinical  Information Previous Biopsy: Today. Nodule 4 Left Mid 2.4 cm; Other 2 dimensions: 1.7 z 2.1 cm, Solid /almost completely solid, Hypoechoic, ACR TI-RADS total points: 4, Moderately suspicious nodule Source Thyroid, Fine Needle Aspiration, Lt Lobe LMP, (Specimen 1 of 2, collected on 12/28/16 )  Adequacy Reason Satisfactory But Limited For Evaluation, Scant Cellularity. Diagnosis THYROID, FINE NEEDLE ASPIRATION, LEFT LOBE LUP (SPECIMEN 2 OF 2 COLLECTED [id number]) SCANT FOLLICULAR EPITHELIUM PRESENT (BETHESDA CATEGORY I). Enid Cutter MD Pathologist, Electronic Signature (Case signed 12/29/2016) Specimen Clinical Information Previous Biopsy: Today, Nodule 5 Left Superior 5.1 cm; Other 2 dimensions: 3.2 x 4.2cm, Solid / almost completely solid, Isoechoic, ACR TI-RADS total points: 3, Mildly suspicious nodule Source Thyroid, Fine Needle Aspiration, Lt Lobe LUP, (Specimen 2 of 2,collected on 12/28/16 )  Left mid nodule benign. Left upper nodule inconclusive b/c not enough sample >> will need to repeat the Bx in the future.  Discussed with patient over the phone. She is still having significant neck problems and would like to be referred to ENT (done). If symptoms continue after evaluation and possible treatment by ENT, we may need a second opinion with surgery to see if she may benefit from thyroidectomy.  Philemon Kingdom, MD PhD [facility] Endocrinology

## 2016-12-07 LAB — THYROGLOBULIN ANTIBODY: Thyroglobulin Ab: <1 IU/mL (ref <=1)

## 2016-12-07 LAB — THYROID PEROXIDASE ANTIBODY

## 2016-12-28 ENCOUNTER — Ambulatory Visit
Admission: RE | Admit: 2016-12-28 | Discharge: 2016-12-28 | Disposition: A | Discharge status: Home / Self Care | Payer: Managed Care, Other (non HMO) | Source: Ambulatory Visit | Attending: Internal Medicine | Admitting: Internal Medicine

## 2016-12-28 ENCOUNTER — Other Ambulatory Visit (HOSPITAL_COMMUNITY)
Admission: RE | Admit: 2016-12-28 | Discharge: 2016-12-28 | Disposition: A | Discharge status: Home / Self Care | Payer: Managed Care, Other (non HMO) | Source: Ambulatory Visit | Attending: Radiology | Admitting: Radiology

## 2016-12-28 DIAGNOSIS — E042 Nontoxic multinodular goiter: Secondary | ICD-10-CM | POA: Diagnosis not present

## 2017-01-02 ENCOUNTER — Encounter: Payer: Self-pay | Admitting: Internal Medicine

## 2017-01-04 ENCOUNTER — Other Ambulatory Visit: Payer: Self-pay | Admitting: Internal Medicine

## 2017-01-04 DIAGNOSIS — M542 Cervicalgia: Secondary | ICD-10-CM

## 2017-01-14 ENCOUNTER — Encounter: Payer: Self-pay | Admitting: Internal Medicine

## 2017-03-07 ENCOUNTER — Ambulatory Visit: Payer: Managed Care, Other (non HMO) | Admitting: Internal Medicine

## 2017-03-12 ENCOUNTER — Encounter: Payer: Self-pay | Admitting: Internal Medicine

## 2017-03-15 ENCOUNTER — Encounter: Payer: Self-pay | Admitting: Internal Medicine

## 2017-03-15 ENCOUNTER — Ambulatory Visit: Payer: Managed Care, Other (non HMO) | Admitting: Internal Medicine

## 2017-03-15 VITALS — BP 110/60 | HR 72 | Ht 64.0 in | Wt 151.6 lb

## 2017-03-15 DIAGNOSIS — R7989 Other specified abnormal findings of blood chemistry: Secondary | ICD-10-CM

## 2017-03-15 DIAGNOSIS — E042 Nontoxic multinodular goiter: Secondary | ICD-10-CM | POA: Diagnosis not present

## 2017-03-15 NOTE — Patient Instructions (Signed)
I will refer you to Dr. Harlow Asa.  After the surgery, you will be on Levothyroxine.  Take the thyroid hormone every day, with water, at least 30 minutes before breakfast, separated by at least 4 hours from: - acid reflux medications - calcium - iron - multivitamins  Please come back in 3 months.

## 2017-03-15 NOTE — Progress Notes (Signed)
Patient ID: Selena Scott, female   DOB: 05/21/1980, 36 y.o.   MRN: [id number]    HPI  Selena Scott is a 36 y.o.-year-old female, initially referred by her PCP, Levin Bacon, NP now returning for f/u for Nontoxic multinodular goiter. Last visit 3 mo ago.  Patient started to feel neck enlargement and pressure at the beginning of her pregnancy.  This persisted even after the birth of her baby in 12/2015.  At last visit, she was telling me that she was gagging on saliva, coughing, and snoring.  She saw her PCP and an ultrasound was obtained and showed several nodules.   At this visit, she still complains of neck compression symptoms and she tells me that the discomfort is so intense, she can think of little less throughout the day.  She is very much interested in thyroidectomy.  I reviewed the report of her latest thyroid ultrasound (10/04/2016): Thyroid was markedly heterogenous, with several nodules: Right lobe: 1. Right mid nodule 2.1 x 2.0 x 1.2 cm, solid, isoechoic 2. Right mid nodule 1.9 x 1.4 x 1.2 cm, solid, isoechoic 3. Right inferior nodule 2.4 x 2.3 x 2.0 cm, solid, isoechoic Left lobe: 1. Left mid nodule 2.4 x 1.7 x 2.1 cm, solid, hypoechoic-indication for biopsy 2. Left superior nodule 5.1 x 3.2 x 4.2 cm, solid, isoechoic-indication for biopsy 3. Left inferior nodule 1.8 x 1.5 x 1.4 cm, solid, isoechoic  12/28/2016: FNA of the dominant nodules: - Left mid nodule >> benign.  - Left upper nodule >> inconclusive due to insufficient sample  I reviewed pt's thyroid tests - normal: Lab Results  Component Value Date   TSH 0.67 12/06/2016   FREET4 0.75 12/06/2016  - per Dr. Cordelia Pen note: TSH 0.44  Thyroid antibodies were negative: Thyroperoxidase Ab SerPl-aCnc 12/06/2016 <1  <9 IU/mL  Thyroglobulin Ab 12/06/2016 <1  < or = 1 IU/mL   Pt c/o all: - + feeling nodules in neck - + cough (not relieved by PPIs) - no hoarseness but strangulated voice - + dysphagia - + choking - +  snoring - cannot sleep on her stomach as she normally used to sleep b/c neck discomfort  No FH of thyroid ds. No FH of thyroid cancer. No h/o radiation tx to head or neck.  No seaweed or kelp. No recent contrast studies. No herbal supplements. No Biotin use. No recent steroids use.  ROS: Constitutional: no weight gain/no weight loss, no fatigue, no subjective hyperthermia, no subjective hypothermia Eyes: no blurry vision, no xerophthalmia ENT: no sore throat, + see HPI Cardiovascular: no CP/no SOB/no palpitations/no leg swelling Respiratory: no cough/no SOB/no wheezing Gastrointestinal: no N/no V/no D/no C/no acid reflux Musculoskeletal: no muscle aches/no joint aches Skin: no rashes, no hair loss Neurological: no tremors/no numbness/no tingling/no dizziness  I reviewed pt's medications, allergies, PMH, social hx, family hx, and changes were documented in the history of present illness. Otherwise, unchanged from my initial visit note.  Past Medical History:  Diagnosis Date  . Fibroid   . HPV (human papilloma virus) anogenital infection   . Medical history non-contributory    Past Surgical History:  Procedure Laterality Date  . NO PAST SURGERIES     Social History   Social History  . Marital status: Married    Spouse name: N/A  . Number of children: 3   Occupational History  . HR supervisor   Social History Main Topics  . Smoking status: Never Smoker  . Smokeless tobacco: Never Used  .  Alcohol use No  . Drug use: No   Current Outpatient Medications on File Prior to Visit  Medication Sig Dispense Refill  . ibuprofen (ADVIL,MOTRIN) 600 MG tablet Take 1 tablet (600 mg total) by mouth every 6 (six) hours as needed for headache, mild pain, moderate pain or cramping. (Patient not taking: Reported on 11/17/2016) 40 tablet 1   No current facility-administered medications on file prior to visit.    No Known Allergies Family History  Problem Relation Age of Onset  . Thyroid  disease Neg Hx     PE: BP 110/60   Pulse 72   Ht 5\' 4"  (1.626 m)   Wt 151 lb 9.6 oz (68.8 kg)   LMP 03/06/2017   SpO2 99%   Breastfeeding? No   BMI 26.02 kg/m  Wt Readings from Last 3 Encounters:  03/15/17 151 lb 9.6 oz (68.8 kg)  12/06/16 156 lb (70.8 kg)  11/17/16 156 lb 3.2 oz (70.9 kg)   Constitutional: overweight, in NAD Eyes: PERRLA, EOMI, no exophthalmos ENT: moist mucous membranes,  +++ thyromegaly L>R (the L nodule palpated up to the L mandible angle), no cervical lymphadenopathy Cardiovascular: RRR, No MRG Respiratory: CTA B Gastrointestinal: abdomen soft, NT, ND, BS+ Musculoskeletal: no deformities, strength intact in all 4 Skin: moist, warm, no rashes Neurological: no tremor with outstretched hands, DTR normal in all 4  ASSESSMENT: 1. Multiple thyroid nodules   Adequacy Reason Satisfactory For Evaluation. Diagnosis THYROID, FINE NEEDLE ASPIRATION, LEFT LOBE, LMP (SPECIMEN 1 OF 2 COLLECTED 12-28-2016) CONSISTENT WITH BENIGN FOLLICULAR NODULE (BETHESDA CATEGORY II). Enid Cutter MD Pathologist, Electronic Signature (Case signed 12/29/2016) Specimen Clinical Information Previous Biopsy: Today. Nodule 4 Left Mid 2.4 cm; Other 2 dimensions: 1.7 z 2.1 cm, Solid /almost completely solid, Hypoechoic, ACR TI-RADS total points: 4, Moderately suspicious nodule Source Thyroid, Fine Needle Aspiration, Lt Lobe LMP, (Specimen 1 of 2, collected on 12/28/16 )  Adequacy Reason Satisfactory But Limited For Evaluation, Scant Cellularity. Diagnosis THYROID, FINE NEEDLE ASPIRATION, LEFT LOBE LUP (SPECIMEN 2 OF 2 COLLECTED 19-08-2227) SCANT FOLLICULAR EPITHELIUM PRESENT (BETHESDA CATEGORY I). Enid Cutter MD Pathologist, Electronic Signature (Case signed 12/29/2016) Specimen Clinical Information Previous Biopsy: Today, Nodule 5 Left Superior 5.1 cm; Other 2 dimensions: 3.2 x 4.2cm, Solid / almost completely solid, Isoechoic, ACR TI-RADS total points: 3, Mildly suspicious  nodule Source Thyroid, Fine Needle Aspiration, Lt Lobe LUP, (Specimen 2 of 2,collected on 12/28/16 )  2.  History of low TSH  PLAN: 1. Multiple thyroid nodules - I reviewed again the reports of her thyroid ultrasound and biopsies along with the patient.  She had several nodules, of which 2 were concerning and recommended for biopsy.  1 of the biopsies was benign, however, the largest nodule of 5.1 cm had only an inconclusive biopsy due to lack of enough sample.  We discussed that usually we recommend a repeat biopsy for inconclusive nodules, but not sooner than 6 months.  She is bothered by her thyroid nodules and is interested to have thyroidectomy.  It is difficult to tell if the left lobe is the only culprit for her neck compression symptoms (the left inferior nodule is closer to the esophagus and could cause dysphagia, however, the left superior nodule is also large enough that it may cause problems when she turns her head or when she lays down at night; it is unclear whether the right thyroid nodules also cause compression, but they may in the future), therefore, I think it would be her best interest  to recommend total thyroidectomy.  She agrees. - I explained that thyroid cancer is not a significant concern for her due to the fact that the nodules do not appear worrisome, and she does not have a thyroid cancer family history or a personal history of RxTx to head/neck.  However, final pathology will give [place] a definitive diagnosis. - She agrees with a referral to surgery for now - We did discuss that if we go ahead with total thyroidectomy, she will need thyroid hormones for the rest of her life. Proabbaly 100-112 mcg daily LT4.  We discussed about how to take levothyroxine correctly: every day, with water, at least 30 minutes before breakfast, separated by at least 4 hours from: - acid reflux medications - calcium - iron - multivitamins - We discussed that after surgery I would like to see her  back in about-29month to 1.5 months to recheck her TFTs  2.  History of low TSH - reviewed prev TFTs>> last set normal including thyroid antibodies.  No signs of Hashimoto's thyroiditis. -  We will not repeat the labs today  Philemon Kingdom, MD PhD [facility]

## 2017-03-25 ENCOUNTER — Ambulatory Visit (HOSPITAL_COMMUNITY)
Admission: EM | Admit: 2017-03-25 | Discharge: 2017-03-25 | Disposition: A | Discharge status: Home / Self Care | Payer: Managed Care, Other (non HMO) | Attending: Internal Medicine | Admitting: Internal Medicine

## 2017-03-25 ENCOUNTER — Other Ambulatory Visit: Payer: Self-pay

## 2017-03-25 ENCOUNTER — Encounter (HOSPITAL_COMMUNITY): Payer: Self-pay | Admitting: Emergency Medicine

## 2017-03-25 DIAGNOSIS — H5789 Other specified disorders of eye and adnexa: Secondary | ICD-10-CM | POA: Diagnosis not present

## 2017-03-25 HISTORY — DX: Disorder of thyroid, unspecified: E07.9

## 2017-03-25 MED ORDER — PREDNISONE 20 MG PO TABS: 40.0000 mg | ORAL_TABLET | Freq: Every day | ORAL | 0 refills | Status: AC

## 2017-03-25 MED ORDER — POLYETHYL GLYCOL-PROPYL GLYCOL 0.4-0.3 % OP SOLN: 1.0000 [drp] | Freq: Four times a day (QID) | OPHTHALMIC | 0 refills | Status: DC | PRN

## 2017-03-25 MED ORDER — POLYETHYL GLYCOL-PROPYL GLYCOL 0.4-0.3 % OP GEL: 1.0000 "application " | Freq: Every evening | OPHTHALMIC | 0 refills | Status: DC | PRN

## 2017-03-25 NOTE — Discharge Instructions (Signed)
Discontinue ciprofloxacin eyedrop.  Start Systane artificial tear gel and drop for dry eyes and irritation.  Warm compresses as directed.  Start over-the-counter allergy medicine such as Benadryl, Zyrtec, Allegra, Claritin for swelling and inflammation.  Prednisone for swelling and inflammation. Monitor for any worsening of symptoms, changes in vision, sensitivity to light, eye swelling, follow up with ophthalmology for further evaluation.

## 2017-03-25 NOTE — ED Triage Notes (Addendum)
Patient has red eyes and being treated for pink eye.  Son has same symptoms.  Patient has had a reaction to initial medicine-tobramycin.  And now has burning with last dose of medication prescribed 12/27-ciproflaxacn

## 2017-03-25 NOTE — ED Provider Notes (Signed)
[facility]    CSN: [id number] Arrival date & time: 03/25/17  Selena Scott     History   Chief Complaint Chief Complaint  Patient presents with  . Eye Problem    HPI Selena Scott is a 36 y.o. female.   36 year old female comes in for left eye irritation.  Patient states that she was recently treated for bacterial conjunctivitis, she first started tobramycin, and had eyelid swelling with irritation.  She was switched to ciprofloxacin, which she took for 2 days.  She states now has burning sensation when putting in medication, and continued irritation and pruritus of the eyelid.  She  endorses some blurry vision the first day of applying tobramycin, which has since resolved.  Denies pain with eye movement, photophobia, eye pain.  Crusting and discharge of the eye has since resolved.  Denies fever, chills, night sweats.  Denies contact lens use, glasses use.      Past Medical History:  Diagnosis Date  . Fibroid   . HPV (human papilloma virus) anogenital infection   . Medical history non-contributory   . Thyroid disease     Patient Active Problem List   Diagnosis Date Noted  . Low TSH level 12/06/2016  . Multinodular goiter 11/17/2016  . Indication for care in labor or delivery 01/13/2016  . SVD (spontaneous vaginal delivery) 01/13/2016  . Advanced maternal age in multigravida 08/06/2015    Past Surgical History:  Procedure Laterality Date  . NO PAST SURGERIES      OB History    Gravida Para Term Preterm AB Living   3 3 3     3    SAB TAB Ectopic Multiple Live Births         0 3       Home Medications    Prior to Admission medications   Medication Sig Start Date End Date Taking? Authorizing Provider  Polyethyl Glycol-Propyl Glycol (SYSTANE) 0.4-0.3 % GEL ophthalmic gel Place 1 application into both eyes at bedtime as needed. 03/25/17   Tasia Catchings, Benjamen Koelling V, PA-C  Polyethyl Glycol-Propyl Glycol (SYSTANE) 0.4-0.3 % SOLN Apply 1-2 drops to eye 4 (four) times daily as  needed. 03/25/17   Tasia Catchings, Kallan Merrick V, PA-C  predniSONE (DELTASONE) 20 MG tablet Take 2 tablets (40 mg total) by mouth daily for 3 days. 03/25/17 03/28/17  Ok Edwards, PA-C    Family History Family History  Problem Relation Age of Onset  . Thyroid disease Neg Hx     Social History Social History   Tobacco Use  . Smoking status: Never Smoker  . Smokeless tobacco: Never Used  Substance Use Topics  . Alcohol use: No  . Drug use: No     Allergies   Patient has no known allergies.   Review of Systems Review of Systems  Reason unable to perform ROS: See HPI as above.     Physical Exam Triage Vital Signs ED Triage Vitals  Enc Vitals Group     BP 03/25/17 1642 110/73     Pulse Rate 03/25/17 1642 67     Resp 03/25/17 1642 16     Temp 03/25/17 1642 99.9 F (37.7 C)     Temp Source 03/25/17 1642 Oral     SpO2 03/25/17 1642 100 %     Weight --      Height --      Head Circumference --      Peak Flow --      Pain Score 03/25/17 1651 2  Pain Loc --      Pain Edu? --      Excl. in [place]? --    No data found.  Updated Vital Signs BP 110/73 (BP Location: Right Arm)   Pulse 67   Temp 99.9 F (37.7 C) (Oral)   Resp 16   LMP 03/06/2017   SpO2 100%   Visual Acuity Right Eye Distance: 20/25 uncorrected Left Eye Distance: 20/20 uncorrected Bilateral Distance: 20/25 uncorrected  Right Eye Near:   Left Eye Near:    Bilateral Near:     Physical Exam  Constitutional: She is oriented to person, place, and time. She appears well-developed and well-nourished. No distress.  HENT:  Head: Normocephalic and atraumatic.  Eyes: Conjunctivae and EOM are normal. Pupils are equal, round, and reactive to light.  Slight swelling of the upper and lower left eyelid at the nasal aspect.  No erythema, increased warmth.  No crusting seen.  Neurological: She is alert and oriented to person, place, and time.     UC Treatments / Results  Labs (all labs ordered are listed, but only abnormal  results are displayed) Labs Reviewed - No data to display  EKG  EKG Interpretation None       Radiology No results found.  Procedures Procedures (including critical care time)  Medications Ordered in UC Medications - No data to display   Initial Impression / Assessment and Plan / UC Course  I have reviewed the triage vital signs and the nursing notes.  Pertinent labs & imaging results that were available during my care of the patient were reviewed by me and considered in my medical decision making (see chart for details).    Discussed with patient symptoms most likely due to irritation from medications.  Start artificial tear gel and drop as directed.  Warm compresses of the eye.  OTC antihistamines as directed.  Prednisone as directed.  As symptoms of bacterial conjunctivitis has resolved, she can stop the ciprofloxacin.  Patient to follow-up with ophthalmology for further evaluation and management needed.  Return precautions given.  Patient expresses understanding and agrees to plan.  Final Clinical Impressions(s) / UC Diagnoses   Final diagnoses:  Eye irritation    ED Discharge Orders        Ordered    Polyethyl Glycol-Propyl Glycol (SYSTANE) 0.4-0.3 % GEL ophthalmic gel  At bedtime PRN     03/25/17 1739    Polyethyl Glycol-Propyl Glycol (SYSTANE) 0.4-0.3 % SOLN  4 times daily PRN     03/25/17 1739    predniSONE (DELTASONE) 20 MG tablet  Daily     03/25/17 1739       Ok Edwards, PA-C 03/25/17 1859

## 2017-04-04 ENCOUNTER — Ambulatory Visit: Payer: Self-pay | Admitting: Surgery

## 2017-04-12 NOTE — Patient Instructions (Signed)
Kyanna Starliper  04/12/2017   Your procedure is scheduled on: 04/19/2017   Report to [facility] Main  Entrance  Report to admitting at   1000 AM   Call this number if you have problems the morning of surgery [phone]   Remember: Do not eat food or drink liquids :After Midnight.     Take these medicines the morning of surgery with A SIP OF WATER: Flonase if needed, Eye drops as usual                                 You may not have any metal on your body including hair pins and              piercings  Do not wear jewelry, make-up, lotions, powders or perfumes, deodorant             Do not wear nail polish.  Do not shave  48 hours prior to surgery.                Do not bring valuables to the hospital. [place].  Contacts, dentures or bridgework may not be worn into surgery.  Leave suitcase in the car. After surgery it may be brought to your room.     Patients discharged the day of surgery will not be allowed to drive home.  Name and phone number of your driver:               Please read over the following fact sheets you were given: _____________________________________________________________________             [facility] - Preparing for Surgery Before surgery, you can play an important role.  Because skin is not sterile, your skin needs to be as free of germs as possible.  You can reduce the number of germs on your skin by washing with CHG (chlorahexidine gluconate) soap before surgery.  CHG is an antiseptic cleaner which kills germs and bonds with the skin to continue killing germs even after washing. Please DO NOT use if you have an allergy to CHG or antibacterial soaps.  If your skin becomes reddened/irritated stop using the CHG and inform your nurse when you arrive at Short Stay. Do not shave (including legs and underarms) for at least 48 hours prior to the first CHG shower.  You may shave your  face/neck. Please follow these instructions carefully:  1.  Shower with CHG Soap the night before surgery and the  morning of Surgery.  2.  If you choose to wash your hair, wash your hair first as usual with your  normal  shampoo.  3.  After you shampoo, rinse your hair and body thoroughly to remove the  shampoo.                           4.  Use CHG as you would any other liquid soap.  You can apply chg directly  to the skin and wash                       Gently with a scrungie or clean washcloth.  5.  Apply the CHG Soap to  your body ONLY FROM THE NECK DOWN.   Do not use on face/ open                           Wound or open sores. Avoid contact with eyes, ears mouth and genitals (private parts).                       Wash face,  Genitals (private parts) with your normal soap.             6.  Wash thoroughly, paying special attention to the area where your surgery  will be performed.  7.  Thoroughly rinse your body with warm water from the neck down.  8.  DO NOT shower/wash with your normal soap after using and rinsing off  the CHG Soap.                9.  Pat yourself dry with a clean towel.            10.  Wear clean pajamas.            11.  Place clean sheets on your bed the night of your first shower and do not  sleep with pets. Day of Surgery : Do not apply any lotions/deodorants the morning of surgery.  Please wear clean clothes to the hospital/surgery center.  FAILURE TO FOLLOW THESE INSTRUCTIONS MAY RESULT IN THE CANCELLATION OF YOUR SURGERY PATIENT SIGNATURE_________________________________  NURSE SIGNATURE__________________________________  ________________________________________________________________________

## 2017-04-13 ENCOUNTER — Encounter (HOSPITAL_COMMUNITY): Payer: Self-pay

## 2017-04-13 ENCOUNTER — Encounter (HOSPITAL_COMMUNITY)
Admission: RE | Admit: 2017-04-13 | Discharge: 2017-04-13 | Disposition: A | Discharge status: Home / Self Care | Payer: BLUE CROSS/BLUE SHIELD | Source: Ambulatory Visit | Attending: Surgery | Admitting: Surgery

## 2017-04-13 ENCOUNTER — Ambulatory Visit (HOSPITAL_COMMUNITY)
Admission: RE | Admit: 2017-04-13 | Discharge: 2017-04-13 | Disposition: A | Discharge status: Home / Self Care | Payer: BLUE CROSS/BLUE SHIELD | Source: Ambulatory Visit | Attending: Anesthesiology | Admitting: Anesthesiology

## 2017-04-13 ENCOUNTER — Other Ambulatory Visit: Payer: Self-pay

## 2017-04-13 DIAGNOSIS — Z01818 Encounter for other preprocedural examination: Secondary | ICD-10-CM

## 2017-04-13 HISTORY — DX: Anemia, unspecified: D64.9

## 2017-04-13 HISTORY — DX: Gastro-esophageal reflux disease without esophagitis: K21.9

## 2017-04-13 LAB — CBC
HEMATOCRIT: 37.3 % (ref 36.0–46.0)
HEMOGLOBIN: 12.7 g/dL (ref 12.0–15.0)
MCH: 28.3 pg (ref 26.0–34.0)
MCHC: 34 g/dL (ref 30.0–36.0)
MCV: 83.1 fL (ref 78.0–100.0)
Platelets: 275 10*3/uL (ref 150–400)
RBC: 4.49 MIL/uL (ref 3.87–5.11)
RDW: 13.8 % (ref 11.5–15.5)
WBC: 4.8 10*3/uL (ref 4.0–10.5)

## 2017-04-13 LAB — HCG, SERUM, QUALITATIVE: Preg, Serum: NEGATIVE

## 2017-04-17 ENCOUNTER — Encounter (HOSPITAL_COMMUNITY): Payer: Self-pay | Admitting: Surgery

## 2017-04-17 NOTE — H&P (Signed)
General Surgery [facility]  Deziya Mcquitty DOB: 1980-05-13 Married / Language: English / Race: Black or African American Female   History of Present Illness  The patient is a 37 year old female who presents with a complaint of Enlarged thyroid.  CC: multinodular thyroid goiter with compressive symptoms  Patient is referred by Dr. Philemon Kingdom for surgical evaluation and management of multinodular thyroid goiter with compressive symptoms. Patient first noted enlargement of the thyroid one to 2 years ago. She had symptoms during her pregnancy. She developed symptoms of dysphagia. She complained of "soreness" particularly in the left side of the neck. She has developed snoring according to her husband. She has had problems swallowing secretions. Patient underwent an ultrasound examination in July 2018. This showed a moderately enlarged thyroid gland with the right side measuring 8.5 cm and left side measuring 9.2 cm. There were multiple dominant nodules. Patient did undergo fine-needle aspiration biopsy of 2 nodules in the left thyroid lobe. One nodule had a benign cytopathology results. The other nodule was inconclusive due to inadequate sampling. TSH level is normal at 0.67. Patient has had no prior head or neck surgery. She has never been on thyroid medication. Patient believes her mother has had some problems with her thyroid but there is no family history of thyroid malignancy or other endocrine neoplasms. Patient denies any tremors or palpitations. She presents today accompanied by her husband to discuss total thyroidectomy for removal of thyroid goiter and release of compressive symptoms.   Past Surgical History No pertinent past surgical history   Diagnostic Studies History Colonoscopy  never Mammogram  >3 years ago Pap Smear  1-5 years ago  Allergies No Known Drug Allergies [04/04/2017]: Allergies Reconciled   Medication History No  Current Medications Medications Reconciled  Social History Alcohol use  Occasional alcohol use. Caffeine use  Carbonated beverages, Coffee, Tea. No drug use  Tobacco use  Never smoker.  Family History  Breast Cancer  Mother. Diabetes Mellitus  Mother. Hypertension  Mother.  Pregnancy / Birth History Age at menarche  7 years. Gravida  3 Irregular periods  Length (months) of breastfeeding  3-6 Maternal age  60-20 Para  3  Other Problems  Thyroid Disease    Review of Systems General Not Present- Appetite Loss, Chills, Fatigue, Fever, Night Sweats, Weight Gain and Weight Loss. Skin Not Present- Change in Wart/Mole, Dryness, Hives, Jaundice, New Lesions, Non-Healing Wounds, Rash and Ulcer. HEENT Not Present- Earache, Hearing Loss, Hoarseness, Nose Bleed, Oral Ulcers, Ringing in the Ears, Seasonal Allergies, Sinus Pain, Sore Throat, Visual Disturbances, Wears glasses/contact lenses and Yellow Eyes. Respiratory Present- Snoring. Not Present- Bloody sputum, Chronic Cough, Difficulty Breathing and Wheezing. Breast Not Present- Breast Mass, Breast Pain, Nipple Discharge and Skin Changes. Cardiovascular Not Present- Chest Pain, Difficulty Breathing Lying Down, Leg Cramps, Palpitations, Rapid Heart Rate, Shortness of Breath and Swelling of Extremities. Gastrointestinal Not Present- Abdominal Pain, Bloating, Bloody Stool, Change in Bowel Habits, Chronic diarrhea, Constipation, Difficulty Swallowing, Excessive gas, Gets full quickly at meals, Hemorrhoids, Indigestion, Nausea, Rectal Pain and Vomiting. Female Genitourinary Not Present- Frequency, Nocturia, Painful Urination, Pelvic Pain and Urgency. Musculoskeletal Not Present- Back Pain, Joint Pain, Joint Stiffness, Muscle Pain, Muscle Weakness and Swelling of Extremities. Neurological Not Present- Decreased Memory, Fainting, Headaches, Numbness, Seizures, Tingling, Tremor, Trouble walking and Weakness. Psychiatric Not  Present- Anxiety, Bipolar, Change in Sleep Pattern, Depression, Fearful and Frequent crying. Endocrine Not Present- Cold Intolerance, Excessive Hunger, Hair Changes, Heat Intolerance, Hot flashes and  New Diabetes. Hematology Present- Gland problems. Not Present- Blood Thinners, Easy Bruising, Excessive bleeding, HIV and Persistent Infections.  Vitals Weight: 150.5 lb Height: 63in Body Surface Area: 1.71 m Body Mass Index: 26.66 kg/m  Temp.: 97.39F  Pulse: 80 (Regular)  BP: 102/68 (Sitting, Left Arm, Standard)  Physical Exam  See vital signs recorded above  GENERAL APPEARANCE Development: normal Nutritional status: normal Gross deformities: none  SKIN Rash, lesions, ulcers: none Induration, erythema: none Nodules: none palpable  EYES Conjunctiva and lids: normal Pupils: equal and reactive Iris: normal bilaterally  EARS, NOSE, MOUTH, THROAT External ears: no lesion or deformity External nose: no lesion or deformity Hearing: grossly normal Lips: no lesion or deformity Dentition: normal for age Oral mucosa: moist  NECK Symmetric: no Trachea: midline Thyroid: Moderately to markedly enlarged thyroid gland, greater on the left than on the right. On palpation both lobes are relatively soft, fleshy, but enlarged. No significant tenderness. No dominant nor discrete masses are palpable.  CHEST Respiratory effort: normal Retraction or accessory muscle use: no Breath sounds: normal bilaterally Rales, rhonchi, wheeze: none  CARDIOVASCULAR Auscultation: regular rhythm, normal rate Murmurs: none Pulses: carotid and radial pulse 2+ palpable Lower extremity edema: none Lower extremity varicosities: none  MUSCULOSKELETAL Station and gait: normal Digits and nails: no clubbing or cyanosis Muscle strength: grossly normal all extremities Range of motion: grossly normal all extremities Deformity: none  LYMPHATIC Cervical: none palpable Supraclavicular: none  palpable  PSYCHIATRIC Oriented to person, place, and time: yes Mood and affect: normal for situation Judgment and insight: appropriate for situation    Assessment & Plan  MULTIPLE THYROID NODULES (E04.2)  ENLARGED THYROID (E04.9)  Patient presents today on referral from her endocrinologist for evaluation for total thyroidectomy for management of multiple thyroid nodules and enlarged thyroid causing compressive symptoms. She is accompanied by her husband. They are provided with written literature on thyroid surgery to review at home.  After review of her ultrasound and biopsy reports, and after a discussion of her clinical history, I have recommended proceeding with total thyroidectomy. We discussed the risk and benefits of the procedure including the risk of injury to recurrent laryngeal nerves and parathyroid glands. We discussed the location of the surgical incision. We discussed the hospital stay to be anticipated. We discussed her postoperative recovery and need for lifelong thyroid hormone replacement. Patient understands and wishes to proceed with surgery in the near future.  The risks and benefits of the procedure have been discussed at length with the patient. The patient understands the proposed procedure, potential alternative treatments, and the course of recovery to be expected. All of the patient's questions have been answered at this time. The patient wishes to proceed with surgery.  Armandina Gemma, [place] [facility] Office: [phone]

## 2017-04-19 ENCOUNTER — Observation Stay (HOSPITAL_COMMUNITY)
Admission: RE | Admit: 2017-04-19 | Discharge: 2017-04-20 | Disposition: A | Discharge status: Home / Self Care | Payer: BLUE CROSS/BLUE SHIELD | Source: Ambulatory Visit | Attending: Surgery | Admitting: Surgery

## 2017-04-19 ENCOUNTER — Encounter (HOSPITAL_COMMUNITY)
Admission: RE | Disposition: A | Discharge status: Home / Self Care | Payer: Self-pay | Source: Ambulatory Visit | Attending: Surgery

## 2017-04-19 ENCOUNTER — Ambulatory Visit (HOSPITAL_COMMUNITY): Payer: BLUE CROSS/BLUE SHIELD | Admitting: Anesthesiology

## 2017-04-19 ENCOUNTER — Other Ambulatory Visit: Payer: Self-pay

## 2017-04-19 ENCOUNTER — Encounter (HOSPITAL_COMMUNITY): Payer: Self-pay

## 2017-04-19 DIAGNOSIS — E049 Nontoxic goiter, unspecified: Secondary | ICD-10-CM | POA: Diagnosis present

## 2017-04-19 DIAGNOSIS — E042 Nontoxic multinodular goiter: Principal | ICD-10-CM | POA: Diagnosis present

## 2017-04-19 HISTORY — PX: THYROIDECTOMY: SHX17

## 2017-04-19 SURGERY — THYROIDECTOMY
Anesthesia: General | Site: Neck

## 2017-04-19 MED ORDER — SCOPOLAMINE 1 MG/3DAYS TD PT72
MEDICATED_PATCH | TRANSDERMAL | Status: DC | PRN
  Administered 2017-04-19: 1 via TRANSDERMAL

## 2017-04-19 MED ORDER — OXYCODONE HCL 5 MG/5ML PO SOLN
5.0000 mg | Freq: Once | ORAL | Status: DC | PRN
  Filled 2017-04-19: qty 5

## 2017-04-19 MED ORDER — ONDANSETRON HCL 4 MG/2ML IJ SOLN
INTRAMUSCULAR | Status: DC | PRN
  Administered 2017-04-19: 4 mg via INTRAVENOUS

## 2017-04-19 MED ORDER — DEXAMETHASONE SODIUM PHOSPHATE 10 MG/ML IJ SOLN
INTRAMUSCULAR | Status: AC
  Filled 2017-04-19: qty 1

## 2017-04-19 MED ORDER — TRAMADOL HCL 50 MG PO TABS: 50.0000 mg | ORAL_TABLET | Freq: Four times a day (QID) | ORAL | Status: DC | PRN

## 2017-04-19 MED ORDER — ROCURONIUM BROMIDE 50 MG/5ML IV SOSY
PREFILLED_SYRINGE | INTRAVENOUS | Status: AC
  Filled 2017-04-19: qty 5

## 2017-04-19 MED ORDER — CHLORHEXIDINE GLUCONATE CLOTH 2 % EX PADS: 6.0000 | MEDICATED_PAD | Freq: Once | CUTANEOUS | Status: DC

## 2017-04-19 MED ORDER — TRAMADOL HCL 50 MG PO TABS
50.0000 mg | ORAL_TABLET | Freq: Four times a day (QID) | ORAL | Status: DC | PRN
  Administered 2017-04-20: 50 mg via ORAL
  Filled 2017-04-19: qty 1

## 2017-04-19 MED ORDER — 0.9 % SODIUM CHLORIDE (POUR BTL) OPTIME
TOPICAL | Status: DC | PRN
  Administered 2017-04-19: 1000 mL

## 2017-04-19 MED ORDER — ACETAMINOPHEN 650 MG RE SUPP: 650.0000 mg | Freq: Four times a day (QID) | RECTAL | Status: DC | PRN

## 2017-04-19 MED ORDER — FENTANYL CITRATE (PF) 100 MCG/2ML IJ SOLN
INTRAMUSCULAR | Status: AC
  Filled 2017-04-19: qty 2

## 2017-04-19 MED ORDER — CEFAZOLIN SODIUM-DEXTROSE 2-4 GM/100ML-% IV SOLN
2.0000 g | INTRAVENOUS | Status: AC
  Administered 2017-04-19: 2 g via INTRAVENOUS
  Filled 2017-04-19: qty 100

## 2017-04-19 MED ORDER — HYDROMORPHONE HCL 1 MG/ML IJ SOLN
1.0000 mg | INTRAMUSCULAR | Status: DC | PRN
  Administered 2017-04-19 (×2): 1 mg via INTRAVENOUS
  Filled 2017-04-19 (×2): qty 1

## 2017-04-19 MED ORDER — KCL IN DEXTROSE-NACL 20-5-0.45 MEQ/L-%-% IV SOLN
INTRAVENOUS | Status: DC
  Administered 2017-04-19: 1000 mL via INTRAVENOUS
  Filled 2017-04-19 (×2): qty 1000

## 2017-04-19 MED ORDER — SCOPOLAMINE 1 MG/3DAYS TD PT72
MEDICATED_PATCH | TRANSDERMAL | Status: AC
  Filled 2017-04-19: qty 1

## 2017-04-19 MED ORDER — HYDROCODONE-ACETAMINOPHEN 5-325 MG PO TABS
1.0000 | ORAL_TABLET | ORAL | Status: DC | PRN
  Administered 2017-04-20 (×2): 1 via ORAL
  Filled 2017-04-19 (×2): qty 1

## 2017-04-19 MED ORDER — ONDANSETRON HCL 4 MG/2ML IJ SOLN
4.0000 mg | Freq: Four times a day (QID) | INTRAMUSCULAR | Status: DC | PRN
  Administered 2017-04-19: 4 mg via INTRAVENOUS
  Filled 2017-04-19: qty 2

## 2017-04-19 MED ORDER — FENTANYL CITRATE (PF) 100 MCG/2ML IJ SOLN
INTRAMUSCULAR | Status: DC | PRN
  Administered 2017-04-19 (×7): 50 ug via INTRAVENOUS

## 2017-04-19 MED ORDER — PROPOFOL 10 MG/ML IV BOLUS
INTRAVENOUS | Status: AC
  Filled 2017-04-19: qty 20

## 2017-04-19 MED ORDER — FENTANYL CITRATE (PF) 100 MCG/2ML IJ SOLN
INTRAMUSCULAR | Status: AC
  Administered 2017-04-19: 50 ug via INTRAVENOUS
  Filled 2017-04-19: qty 2

## 2017-04-19 MED ORDER — ONDANSETRON HCL 4 MG/2ML IJ SOLN
INTRAMUSCULAR | Status: AC
  Filled 2017-04-19: qty 2

## 2017-04-19 MED ORDER — OXYCODONE HCL 5 MG PO TABS: 5.0000 mg | ORAL_TABLET | Freq: Once | ORAL | Status: DC | PRN

## 2017-04-19 MED ORDER — MIDAZOLAM HCL 5 MG/5ML IJ SOLN
INTRAMUSCULAR | Status: DC | PRN
  Administered 2017-04-19: 2 mg via INTRAVENOUS

## 2017-04-19 MED ORDER — PROMETHAZINE HCL 25 MG/ML IJ SOLN: 6.2500 mg | INTRAMUSCULAR | Status: DC | PRN

## 2017-04-19 MED ORDER — ROCURONIUM BROMIDE 10 MG/ML (PF) SYRINGE
PREFILLED_SYRINGE | INTRAVENOUS | Status: DC | PRN
  Administered 2017-04-19: 10 mg via INTRAVENOUS
  Administered 2017-04-19: 50 mg via INTRAVENOUS

## 2017-04-19 MED ORDER — MIDAZOLAM HCL 2 MG/2ML IJ SOLN
INTRAMUSCULAR | Status: AC
  Filled 2017-04-19: qty 2

## 2017-04-19 MED ORDER — ONDANSETRON 4 MG PO TBDP: 4.0000 mg | ORAL_TABLET | Freq: Four times a day (QID) | ORAL | Status: DC | PRN

## 2017-04-19 MED ORDER — CALCIUM CARBONATE 1250 (500 CA) MG PO TABS
2.0000 | ORAL_TABLET | Freq: Three times a day (TID) | ORAL | Status: DC
  Administered 2017-04-19 – 2017-04-20 (×3): 1000 mg via ORAL
  Filled 2017-04-19 (×5): qty 1

## 2017-04-19 MED ORDER — LACTATED RINGERS IV SOLN
INTRAVENOUS | Status: DC
  Administered 2017-04-19 (×2): via INTRAVENOUS

## 2017-04-19 MED ORDER — DEXAMETHASONE SODIUM PHOSPHATE 10 MG/ML IJ SOLN
INTRAMUSCULAR | Status: DC | PRN
  Administered 2017-04-19: 10 mg via INTRAVENOUS

## 2017-04-19 MED ORDER — PROPOFOL 10 MG/ML IV BOLUS
INTRAVENOUS | Status: DC | PRN
  Administered 2017-04-19: 200 mg via INTRAVENOUS

## 2017-04-19 MED ORDER — SUGAMMADEX SODIUM 200 MG/2ML IV SOLN
INTRAVENOUS | Status: DC | PRN
  Administered 2017-04-19: 140 mg via INTRAVENOUS

## 2017-04-19 MED ORDER — LIDOCAINE 2% (20 MG/ML) 5 ML SYRINGE
INTRAMUSCULAR | Status: DC | PRN
  Administered 2017-04-19: 50 mg via INTRAVENOUS

## 2017-04-19 MED ORDER — SUGAMMADEX SODIUM 200 MG/2ML IV SOLN
INTRAVENOUS | Status: AC
  Filled 2017-04-19: qty 2

## 2017-04-19 MED ORDER — FENTANYL CITRATE (PF) 100 MCG/2ML IJ SOLN
25.0000 ug | INTRAMUSCULAR | Status: DC | PRN
  Administered 2017-04-19 (×3): 50 ug via INTRAVENOUS

## 2017-04-19 MED ORDER — LIDOCAINE 2% (20 MG/ML) 5 ML SYRINGE
INTRAMUSCULAR | Status: AC
  Filled 2017-04-19: qty 5

## 2017-04-19 MED ORDER — FENTANYL CITRATE (PF) 250 MCG/5ML IJ SOLN
INTRAMUSCULAR | Status: AC
  Filled 2017-04-19: qty 5

## 2017-04-19 MED ORDER — ACETAMINOPHEN 325 MG PO TABS
650.0000 mg | ORAL_TABLET | Freq: Four times a day (QID) | ORAL | Status: DC | PRN
  Administered 2017-04-19: 650 mg via ORAL
  Filled 2017-04-19: qty 2

## 2017-04-19 SURGICAL SUPPLY — 36 items
ATTRACTOMAT 16X20 MAGNETIC DRP (DRAPES) ×3 IMPLANT
BLADE SURG 15 STRL LF DISP TIS (BLADE) ×1 IMPLANT
BLADE SURG 15 STRL SS (BLADE) ×2
CHLORAPREP W/TINT 26ML (MISCELLANEOUS) ×3 IMPLANT
CLIP VESOCCLUDE MED 6/CT (CLIP) ×18 IMPLANT
CLIP VESOCCLUDE SM WIDE 6/CT (CLIP) ×9 IMPLANT
CLOSURE WOUND 1/2 X4 (GAUZE/BANDAGES/DRESSINGS) ×1
COVER SURGICAL LIGHT HANDLE (MISCELLANEOUS) ×3 IMPLANT
DISSECTOR ROUND CHERRY 3/8 STR (MISCELLANEOUS) IMPLANT
DRAPE LAPAROTOMY T 98X78 PEDS (DRAPES) ×3 IMPLANT
ELECT PENCIL ROCKER SW 15FT (MISCELLANEOUS) ×3 IMPLANT
ELECT REM PT RETURN 15FT ADLT (MISCELLANEOUS) ×3 IMPLANT
GAUZE SPONGE 4X4 12PLY STRL (GAUZE/BANDAGES/DRESSINGS) ×3 IMPLANT
GAUZE SPONGE 4X4 16PLY XRAY LF (GAUZE/BANDAGES/DRESSINGS) ×6 IMPLANT
GLOVE SURG ORTHO 8.0 STRL STRW (GLOVE) ×3 IMPLANT
GOWN STRL REUS W/TWL XL LVL3 (GOWN DISPOSABLE) ×9 IMPLANT
HEMOSTAT SURGICEL 2X4 FIBR (HEMOSTASIS) IMPLANT
ILLUMINATOR WAVEGUIDE N/F (MISCELLANEOUS) ×3 IMPLANT
KIT BASIN OR (CUSTOM PROCEDURE TRAY) ×3 IMPLANT
LIGHT WAVEGUIDE WIDE FLAT (MISCELLANEOUS) IMPLANT
PACK BASIC VI WITH GOWN DISP (CUSTOM PROCEDURE TRAY) ×3 IMPLANT
POWDER SURGICEL 3.0 GRAM (HEMOSTASIS) ×3 IMPLANT
SHEARS HARMONIC 9CM CVD (BLADE) ×3 IMPLANT
STAPLER VISISTAT 35W (STAPLE) IMPLANT
STRIP CLOSURE SKIN 1/2X4 (GAUZE/BANDAGES/DRESSINGS) ×2 IMPLANT
SUT MNCRL AB 4-0 PS2 18 (SUTURE) ×3 IMPLANT
SUT SILK 2 0 (SUTURE)
SUT SILK 2-0 18XBRD TIE 12 (SUTURE) IMPLANT
SUT SILK 3 0 (SUTURE)
SUT SILK 3-0 18XBRD TIE 12 (SUTURE) IMPLANT
SUT VIC AB 3-0 SH 18 (SUTURE) ×6 IMPLANT
SYR BULB IRRIGATION 50ML (SYRINGE) ×3 IMPLANT
TAPE CLOTH SURG 4X10 WHT LF (GAUZE/BANDAGES/DRESSINGS) ×3 IMPLANT
TOWEL OR 17X26 10 PK STRL BLUE (TOWEL DISPOSABLE) ×3 IMPLANT
TOWEL OR NON WOVEN STRL DISP B (DISPOSABLE) ×3 IMPLANT
YANKAUER SUCT BULB TIP 10FT TU (MISCELLANEOUS) ×3 IMPLANT

## 2017-04-19 NOTE — Interval H&P Note (Signed)
History and Physical Interval Note:  04/19/2017 11:46 AM  Selena Scott  has presented today for surgery, with the diagnosis of MULTIPLE THYROID NODULES, ENLARGED THYROID.  The various methods of treatment have been discussed with the patient and family. After consideration of risks, benefits and other options for treatment, the patient has consented to    Procedure(s): TOTAL THYROIDECTOMY (N/A) as a surgical intervention .    The patient's history has been reviewed, patient examined, no change in status, stable for surgery.  I have reviewed the patient's chart and labs.  Questions were answered to the patient's satisfaction.    Armandina Gemma, [place] [facility] Office: [place]

## 2017-04-19 NOTE — Anesthesia Preprocedure Evaluation (Addendum)
Anesthesia Evaluation  Patient identified by MRN, date of birth, ID band Patient awake    Reviewed: Allergy & Precautions, H&P , NPO status , Patient's Chart, lab work & pertinent test results  Airway Mallampati: II  TM Distance: >3 FB Neck ROM: Full    Dental no notable dental hx. (+) Teeth Intact, Dental Advisory Given   Pulmonary neg pulmonary ROS,    Pulmonary exam normal breath sounds clear to auscultation       Cardiovascular negative cardio ROS Normal cardiovascular exam Rhythm:Regular Rate:Normal     Neuro/Psych negative neurological ROS  negative psych ROS   GI/Hepatic negative GI ROS, Neg liver ROS,   Endo/Other  Thyroid nodules  Renal/GU negative Renal ROS  negative genitourinary   Musculoskeletal negative musculoskeletal ROS (+)   Abdominal   Peds  Hematology negative hematology ROS (+)   Anesthesia Other Findings   Reproductive/Obstetrics                            Anesthesia Physical  Anesthesia Plan  ASA: I  Anesthesia Plan: General   Post-op Pain Management:    Induction: Intravenous  PONV Risk Score and Plan: 4 or greater and Treatment may vary due to age or medical condition, Scopolamine patch - Pre-op, Midazolam, Dexamethasone and Ondansetron  Airway Management Planned: Oral ETT  Additional Equipment: None  Intra-op Plan:   Post-operative Plan: Extubation in OR  Informed Consent: I have reviewed the patients History and Physical, chart, labs and discussed the procedure including the risks, benefits and alternatives for the proposed anesthesia with the patient or authorized representative who has indicated his/her understanding and acceptance.   Dental advisory given  Plan Discussed with: CRNA  Anesthesia Plan Comments:        Anesthesia Quick Evaluation

## 2017-04-19 NOTE — Brief Op Note (Signed)
04/19/2017  2:12 PM  PATIENT:  Selena Scott  37 y.o. female  PRE-OPERATIVE DIAGNOSIS:  MULTIPLE THYROID NODULES, ENLARGED THYROID  POST-OPERATIVE DIAGNOSIS:  MULTIPLE THYROID NODULES, ENLARGED THYROID  PROCEDURE:  Procedure(s): TOTAL THYROIDECTOMY (N/A)  SURGEON:  Surgeon(s) and Role:    * Armandina Gemma, MD - Primary  ANESTHESIA:   general  EBL:  150 cc  BLOOD ADMINISTERED:none  DRAINS: none   LOCAL MEDICATIONS USED:  NONE  SPECIMEN:  Excision  DISPOSITION OF SPECIMEN:  PATHOLOGY  COUNTS:  YES  TOURNIQUET:  * No tourniquets in log *  DICTATION: .Other Dictation: Dictation Number [id number]  PLAN OF CARE: Admit for overnight observation  PATIENT DISPOSITION:  PACU - hemodynamically stable.   Delay start of Pharmacological VTE agent (>24hrs) due to surgical blood loss or risk of bleeding: yes  Armandina Gemma, MD [facility] Office: [phone]

## 2017-04-19 NOTE — Anesthesia Procedure Notes (Signed)
Procedure Name: Intubation Date/Time: 04/19/2017 12:05 PM Performed by: Victoriano Lain, CRNA Pre-anesthesia Checklist: Patient identified, Emergency Drugs available, Suction available, Patient being monitored and Timeout performed Patient Re-evaluated:Patient Re-evaluated prior to induction Oxygen Delivery Method: Circle system utilized Preoxygenation: Pre-oxygenation with 100% oxygen Induction Type: IV induction Ventilation: Mask ventilation without difficulty Laryngoscope Size: Miller and 2 Grade View: Grade I Tube type: Oral Tube size: 7.5 mm Number of attempts: 1 Airway Equipment and Method: Stylet Placement Confirmation: ETT inserted through vocal cords under direct vision,  positive ETCO2 and breath sounds checked- equal and bilateral Secured at: 22 cm Tube secured with: Tape Dental Injury: Teeth and Oropharynx as per pre-operative assessment

## 2017-04-19 NOTE — Transfer of Care (Signed)
Immediate Anesthesia Transfer of Care Note  Patient: Selena Scott  Procedure(s) Performed: TOTAL THYROIDECTOMY (N/A Neck)  Patient Location: PACU  Anesthesia Type:General  Level of Consciousness: awake, alert , oriented and patient cooperative  Airway & Oxygen Therapy: Patient Spontanous Breathing and Patient connected to face mask oxygen  Post-op Assessment: Report given to RN, Post -op Vital signs reviewed and stable and Patient moving all extremities  Post vital signs: Reviewed and stable  Last Vitals:  Vitals:   04/19/17 1026  BP: 113/69  Pulse: 79  Resp: 16  Temp: 36.8 C  SpO2: 100%    Last Pain:  Vitals:   04/19/17 1026  TempSrc: Oral         Complications: No apparent anesthesia complications

## 2017-04-20 ENCOUNTER — Encounter (HOSPITAL_COMMUNITY): Payer: Self-pay | Admitting: Surgery

## 2017-04-20 DIAGNOSIS — E042 Nontoxic multinodular goiter: Secondary | ICD-10-CM | POA: Diagnosis not present

## 2017-04-20 LAB — BASIC METABOLIC PANEL
Anion gap: 7 (ref 5–15)
BUN: 8 mg/dL (ref 6–20)
CHLORIDE: 106 mmol/L (ref 101–111)
CO2: 25 mmol/L (ref 22–32)
Calcium: 8 mg/dL — ABNORMAL LOW (ref 8.9–10.3)
Creatinine, Ser: 0.74 mg/dL (ref 0.44–1.00)
GFR calc non Af Amer: >60 mL/min (ref >60)
Glucose, Bld: 119 mg/dL — ABNORMAL HIGH (ref 65–99)
Potassium: 3.8 mmol/L (ref 3.5–5.1)
SODIUM: 138 mmol/L (ref 135–145)

## 2017-04-20 MED ORDER — CALCIUM CARBONATE ANTACID 500 MG PO CHEW: 2.0000 | CHEWABLE_TABLET | Freq: Four times a day (QID) | ORAL | 1 refills | Status: DC

## 2017-04-20 MED ORDER — SODIUM CHLORIDE 0.9 % IV SOLN
2.0000 g | INTRAVENOUS | Status: AC
  Administered 2017-04-20: 2 g via INTRAVENOUS
  Filled 2017-04-20: qty 20

## 2017-04-20 MED ORDER — TRAMADOL HCL 50 MG PO TABS: 50.0000 mg | ORAL_TABLET | Freq: Four times a day (QID) | ORAL | 0 refills | Status: DC | PRN

## 2017-04-20 MED ORDER — CALCITRIOL 0.25 MCG PO CAPS: 0.2500 ug | ORAL_CAPSULE | Freq: Every day | ORAL | 0 refills | Status: DC

## 2017-04-20 MED ORDER — LEVOTHYROXINE SODIUM 88 MCG PO TABS: 88.0000 ug | ORAL_TABLET | Freq: Every day | ORAL | 3 refills | Status: DC

## 2017-04-20 NOTE — Op Note (Signed)
NAME:  LAFOY, Arleta                    ACCOUNT NO.:  MEDICAL RECORD NO.:  [id number]  LOCATION:                                 FACILITY:  PHYSICIAN:  Earnstine Regal, MD      DATE OF BIRTH:  12-May-1980  DATE OF PROCEDURE:  04/19/2017                              OPERATIVE REPORT   PREOPERATIVE DIAGNOSIS:  Multinodular thyroid goiter with compressive symptoms.  POSTOPERATIVE DIAGNOSIS:  Multinodular thyroid goiter with compressive symptoms.  PROCEDURE:  Total thyroidectomy.  SURGEON:  Earnstine Regal, MD  ANESTHESIA:  General.  ESTIMATED BLOOD LOSS:  150 mL.  PREPARATION:  ChloraPrep.  COMPLICATIONS:  None.  INDICATIONS:  The patient is a 37 year old female referred by her endocrinologist, Dr. Philemon Kingdom, for surgical evaluation and management of multinodular thyroid goiter with compressive symptoms. The patient had first noted an enlarged thyroid approximately 2 years ago.  She had symptoms during pregnancy with dysphagia and discomfort. The patient had problems swallowing secretions.  Ultrasound examination showed an enlarged thyroid gland with the right lobe measuring 8.5 cm in maximum dimension and the left side measuring 9.2 cm.  There were multiple dominant nodules.  The patient underwent fine-needle aspiration biopsy of two of the dominant nodules in the left thyroid lobe.  One biopsy was read as benign.  The other was inconclusive due to inadequate tissue sampling.  TSH level is low normal at 0.67.  The patient now comes to Surgery for thyroidectomy for relief of multinodular thyroid goiter with compressive symptoms.  BODY OF REPORT:  Procedure was done in OR #1 at the [facility]  The patient was brought to the operating room, and placed in supine position on the operating room table.  Following administration of general anesthesia, the patient was positioned and then prepped and draped in the usual aseptic fashion.  After ascertaining  that an adequate level of anesthesia had been achieved, a Kocher incision was made with a #15 blade.  Dissection was carried through the subcutaneous tissues and platysma.  Hemostasis was achieved with the electrocautery.  Skin flaps were elevated cephalad and caudad. A Mahorner self-retaining retractor was placed for exposure.  Strap muscles were incised in the midline.  Dissection was begun on the left side.  Left thyroid lobe was gently mobilized by elevating the strap muscles and reflecting them laterally.  There were some inflammatory- type changes with adhesion of the strap muscles to the capsule of the gland.  Gland was gently mobilized with blunt dissection.  Vascular tributaries were divided between Ligaclips with the Harmonic scalpel. Superior pole was markedly enlarged and contained a large dominant nodule.  With some difficulty, the superior pole was gently dissected out and mobilized.  Superior pole vessels were divided between medium Ligaclips with the Harmonic scalpel.  Parathyroid tissue was identified and preserved.  Gland was mobilized and rotated anteriorly.  Inferior pole was dissected out and venous tributaries divided between Ligaclips with the Harmonic scalpel.  Branches of the inferior thyroid artery were divided between small and medium Ligaclips with the Harmonic scalpel. Ligament of Gwenlyn Found was identified and released with the electrocautery. The gland was mobilized  up and onto the anterior trachea.  The isthmus was mobilized across the midline.  There was no significant pyramidal lobe present.  Dry pack was placed in the left neck.  Next, we turned our attention to the right thyroid lobe.  Right lobe was markedly enlarged, but not quite to the degree seen on the left side. Also, the right superior pole was smaller without a dominant nodule. However, there were still inflammatory-type changes with adhesion of the surrounding strap muscles and structures to the  capsule of the thyroid gland.  Gentle blunt dissection was employed to mobilize the gland. Vascular tributaries were divided between Ligaclips with the Harmonic scalpel.  Superior pole was dissected out and superior pole vessels divided between medium Ligaclips with the Harmonic scalpel.  There was a normal parathyroid gland present superiorly, which was preserved on its vascular pedicle.  Inferior venous tributaries were divided between Ligaclips with the Harmonic scalpel.  Gland was rolled anteriorly. Branches of the inferior thyroid artery were divided between small Ligaclips with the Harmonic scalpel.  Ligament of Gwenlyn Found was released and the gland was mobilized onto the anterior trachea, from which, it was completely excised with the Harmonic scalpel.  Specimen was marked with a suture in the left thyroid lobe.  The gland was multinodular and markedly enlarged.  It was submitted in its entirety to Pathology for review.  Neck was irrigated with warm saline bilaterally.  Good hemostasis was achieved with small Ligaclips.  Powdered Surgicel was placed throughout the operative field.  Good hemostasis was noted.  Strap muscles were reapproximated in the midline with interrupted 3-0 Vicryl sutures.  Platysma was closed with interrupted 3-0 Vicryl sutures.  Skin was closed with a running 4-0 Monocryl subcuticular suture.  Wound was washed and dried, and Steri-Strips were applied. Sterile dressings were applied.  The patient was awakened from anesthesia and brought to the recovery room.  The patient tolerated the procedure well.   Armandina Gemma, [place] [facility] Office: [phone]    TMG/MEDQ  D:  04/19/2017  T:  04/20/2017  Job:  [id number]  cc:   Philemon Kingdom, M.D. Fax: [phone]

## 2017-04-20 NOTE — Discharge Summary (Signed)
Physician Discharge Summary [facility]  Patient ID: Selena Scott MRN: [id number] DOB/AGE: 1980-05-07 37 y.o.  Admit date: 04/19/2017 Discharge date: 04/20/2017  Admission Diagnoses:  Multinodular thyroid goiter with compressive symptoms  Discharge Diagnoses:  Principal Problem:   Multinodular goiter Active Problems:   Thyroid goiter   Discharged Condition: good  Hospital Course: Patient was admitted for observation following thyroid surgery.  Post op course was uncomplicated.  Pain was well controlled.  Tolerated diet.  Post op calcium level on morning following surgery was 8.0 mg/dl.  Patient received calcium gluconate 2 gm IVPB prior to discharge home.  Patient was prepared for discharge home on POD#1.  Consults: None  Treatments: surgery: total thyroidectomy  Discharge Exam: Blood pressure 116/67, pulse 76, temperature 99.6 F (37.6 C), temperature source Oral, resp. rate 18, height 5\' 3"  (1.6 m), weight 66.2 kg (146 lb), last menstrual period 04/03/2017, SpO2 98 %, not currently breastfeeding. HEENT - clear Neck - wound dry and intact; mild STS; voice normal Chest - clear bilaterally Cor - RRR   Disposition: Home  Discharge Instructions    Diet - low sodium heart healthy   Complete by:  As directed    Discharge instructions   Complete by:  As directed    [facility]  THYROID & PARATHYROID SURGERY:  POST-OP INSTRUCTIONS  Always review your discharge instruction sheet from the facility where your surgery was performed.  A prescription for pain medication may be given to you upon discharge.  Take your pain medication as prescribed.  If narcotic pain medicine is not needed, then you may take acetaminophen (Tylenol) or ibuprofen (Advil) as needed.  Take your usually prescribed medications unless otherwise directed.  If you need a refill on your pain medication, please contact our office during regular business hours.   Prescriptions will not be processed by our office after 5 pm or on weekends.  Start with a light diet upon arrival home, such as soup and crackers or toast.  Be sure to drink plenty of fluids daily.  Resume your normal diet the day after surgery.  Most patients will experience some swelling and bruising on the chest and neck area.  Ice packs will help.  Swelling and bruising can take several days to resolve.   It is common to experience some constipation after surgery.  Increasing fluid intake and taking a stool softener (Colace) will usually help or prevent this problem.  A mild laxative (Milk of Magnesia or Miralax) should be taken according to package directions if there has been no bowel movement after 48 hours.  You have steri-strips and a gauze dressing over your incision.  You may remove the gauze bandage on the second day after surgery, and you may shower at that time.  Leave your steri-strips (small skin tapes) in place directly over the incision.  These strips should remain on the skin for 5-7 days and then be removed.  You may get them wet in the shower and pat them dry.  You may resume regular (light) daily activities beginning the next day - such as daily self-care, walking, climbing stairs - gradually increasing activities as tolerated.  You may have sexual intercourse when it is comfortable.  Refrain from any heavy lifting or straining until approved by your doctor.  You may drive when you no longer are taking prescription pain medication, you can comfortably wear a seatbelt, and you can safely maneuver your car and apply brakes.  You should  see your doctor in the office for a follow-up appointment approximately three weeks after your surgery.  Make sure that you call for this appointment within a day or two after you arrive home to insure a convenient appointment time.  WHEN TO CALL YOUR DOCTOR: -- Fever greater than 101.5 -- Inability to urinate -- Nausea and/or vomiting -  persistent -- Extreme swelling or bruising -- Continued bleeding from incision -- Increased pain, redness, or drainage from the incision -- Difficulty swallowing or breathing -- Muscle cramping or spasms -- Numbness or tingling in hands or around lips  The clinic staff is available to answer your questions during regular business hours.  Please don't hesitate to call and ask to speak to one of the nurses if you have concerns.  Earnstine Regal, MD, [facility] Office: [phone]  Website: [email or web address]   Increase activity slowly   Complete by:  As directed    Remove dressing in 24 hours   Complete by:  As directed      Allergies as of 04/20/2017   No Known Allergies     Medication List    TAKE these medications   calcitRIOL 0.25 MCG capsule Commonly known as:  ROCALTROL Take 1 capsule (0.25 mcg total) by mouth daily.   calcium carbonate 500 MG chewable tablet Commonly known as:  TUMS Chew 2 tablets (400 mg of elemental calcium total) by mouth 4 (four) times daily.   fluticasone 50 MCG/ACT nasal spray Commonly known as:  FLONASE Place 1 spray into both nostrils daily as needed for allergies or rhinitis.   levothyroxine 88 MCG tablet Commonly known as:  SYNTHROID Take 1 tablet (88 mcg total) by mouth daily before breakfast.   Polyethyl Glycol-Propyl Glycol 0.4-0.3 % Gel ophthalmic gel Commonly known as:  SYSTANE Place 1 application into both eyes at bedtime as needed.   Polyethyl Glycol-Propyl Glycol 0.4-0.3 % Soln Commonly known as:  SYSTANE Apply 1-2 drops to eye 4 (four) times daily as needed.   traMADol 50 MG tablet Commonly known as:  ULTRAM Take 1-2 tablets (50-100 mg total) by mouth every 6 (six) hours as needed for moderate pain.      Follow-up Information    Armandina Gemma, MD. Schedule an appointment as soon as possible for a visit in 3 week(s).   Specialty:  General Surgery Contact  information: [address] [place] [address] [phone]           Earnstine Regal, MD, [facility] Office: [phone]   Signed: Earnstine Regal 04/20/2017, 11:19 AM

## 2017-04-20 NOTE — Anesthesia Postprocedure Evaluation (Signed)
Anesthesia Post Note  Patient: Selena Scott  Procedure(s) Performed: TOTAL THYROIDECTOMY (N/A Neck)     Patient location during evaluation: PACU Anesthesia Type: General Level of consciousness: awake and alert Pain management: pain level controlled Vital Signs Assessment: post-procedure vital signs reviewed and stable Respiratory status: spontaneous breathing, nonlabored ventilation, respiratory function stable and patient connected to nasal cannula oxygen Cardiovascular status: blood pressure returned to baseline and stable Postop Assessment: no apparent nausea or vomiting Anesthetic complications: no    Last Vitals:  Vitals:   04/20/17 0609 04/20/17 0957  BP: 112/64 116/67  Pulse: 67 76  Resp: 18 18  Temp: 37.7 C 37.6 C  SpO2: 100% 98%    Last Pain:  Vitals:   04/20/17 1037  TempSrc:   PainSc: [place]

## 2017-04-23 NOTE — Progress Notes (Signed)
Please contact patient and notify of benign pathology results.  Angelene Rome M. Britanie Harshman, MD, FACS [facility] Office: [phone]   

## 2017-04-25 ENCOUNTER — Inpatient Hospital Stay (HOSPITAL_COMMUNITY)
Admission: EM | Admit: 2017-04-25 | Discharge: 2017-04-30 | DRG: 641 | Disposition: A | Discharge status: Home / Self Care | Payer: BLUE CROSS/BLUE SHIELD | Attending: Internal Medicine | Admitting: Internal Medicine

## 2017-04-25 ENCOUNTER — Ambulatory Visit (HOSPITAL_COMMUNITY)
Admission: EM | Admit: 2017-04-25 | Discharge: 2017-04-25 | Disposition: A | Discharge status: Home / Self Care | Payer: BLUE CROSS/BLUE SHIELD | Attending: Family Medicine | Admitting: Family Medicine

## 2017-04-25 ENCOUNTER — Emergency Department (HOSPITAL_COMMUNITY)
Admission: EM | Admit: 2017-04-25 | Discharge: 2017-04-25 | Discharge status: Against Medical Advice | Payer: BLUE CROSS/BLUE SHIELD

## 2017-04-25 ENCOUNTER — Emergency Department (HOSPITAL_COMMUNITY): Payer: BLUE CROSS/BLUE SHIELD

## 2017-04-25 ENCOUNTER — Other Ambulatory Visit: Payer: Self-pay

## 2017-04-25 ENCOUNTER — Encounter (HOSPITAL_COMMUNITY): Payer: Self-pay | Admitting: Emergency Medicine

## 2017-04-25 DIAGNOSIS — Z79899 Other long term (current) drug therapy: Secondary | ICD-10-CM | POA: Diagnosis not present

## 2017-04-25 DIAGNOSIS — R112 Nausea with vomiting, unspecified: Secondary | ICD-10-CM

## 2017-04-25 DIAGNOSIS — A084 Viral intestinal infection, unspecified: Secondary | ICD-10-CM | POA: Diagnosis not present

## 2017-04-25 DIAGNOSIS — E049 Nontoxic goiter, unspecified: Secondary | ICD-10-CM | POA: Diagnosis not present

## 2017-04-25 DIAGNOSIS — R197 Diarrhea, unspecified: Secondary | ICD-10-CM

## 2017-04-25 DIAGNOSIS — K219 Gastro-esophageal reflux disease without esophagitis: Secondary | ICD-10-CM | POA: Diagnosis not present

## 2017-04-25 LAB — CBC WITH DIFFERENTIAL/PLATELET
Basophils Absolute: 0 10*3/uL (ref 0.0–0.1)
Basophils Relative: 0 %
Eosinophils Absolute: 0 10*3/uL (ref 0.0–0.7)
Eosinophils Relative: 0 %
HCT: 38.2 % (ref 36.0–46.0)
Hemoglobin: 13.1 g/dL (ref 12.0–15.0)
Lymphocytes Relative: 10 %
Lymphs Abs: 0.7 10*3/uL (ref 0.7–4.0)
MCH: 28.4 pg (ref 26.0–34.0)
MCHC: 34.3 g/dL (ref 30.0–36.0)
MCV: 82.7 fL (ref 78.0–100.0)
Monocytes Absolute: 0.2 10*3/uL (ref 0.1–1.0)
Monocytes Relative: 3 %
Neutro Abs: 5.8 10*3/uL (ref 1.7–7.7)
Neutrophils Relative %: 87 %
Platelets: 291 10*3/uL (ref 150–400)
RBC: 4.62 MIL/uL (ref 3.87–5.11)
RDW: 13.9 % (ref 11.5–15.5)
WBC: 6.7 10*3/uL (ref 4.0–10.5)

## 2017-04-25 LAB — COMPREHENSIVE METABOLIC PANEL
ALT: 15 U/L (ref 14–54)
AST: 22 U/L (ref 15–41)
Albumin: 3.8 g/dL (ref 3.5–5.0)
Alkaline Phosphatase: 69 U/L (ref 38–126)
Anion gap: 14 (ref 5–15)
BUN: 9 mg/dL (ref 6–20)
CHLORIDE: 103 mmol/L (ref 101–111)
CO2: 20 mmol/L — AB (ref 22–32)
CREATININE: 0.88 mg/dL (ref 0.44–1.00)
Calcium: 6.6 mg/dL — ABNORMAL LOW (ref 8.9–10.3)
GFR calc Af Amer: >60 mL/min (ref >60)
Glucose, Bld: 119 mg/dL — ABNORMAL HIGH (ref 65–99)
Potassium: 3.6 mmol/L (ref 3.5–5.1)
SODIUM: 137 mmol/L (ref 135–145)
Total Bilirubin: 0.9 mg/dL (ref 0.3–1.2)
Total Protein: 6.9 g/dL (ref 6.5–8.1)

## 2017-04-25 LAB — URINALYSIS, ROUTINE W REFLEX MICROSCOPIC
BILIRUBIN URINE: NEGATIVE
Glucose, UA: NEGATIVE mg/dL
HGB URINE DIPSTICK: NEGATIVE
Ketones, ur: 5 mg/dL — AB
Leukocytes, UA: NEGATIVE
Nitrite: NEGATIVE
PH: 5 (ref 5.0–8.0)
Protein, ur: NEGATIVE mg/dL
SPECIFIC GRAVITY, URINE: 1.028 (ref 1.005–1.030)

## 2017-04-25 LAB — I-STAT BETA HCG BLOOD, ED (MC, WL, AP ONLY): I-stat hCG, quantitative: <5 m[IU]/mL (ref <5)

## 2017-04-25 LAB — PROTIME-INR
INR: 0.98
Prothrombin Time: 12.9 seconds (ref 11.4–15.2)

## 2017-04-25 LAB — I-STAT CG4 LACTIC ACID, ED: Lactic Acid, Venous: 0.97 mmol/L (ref 0.5–1.9)

## 2017-04-25 MED ORDER — ONDANSETRON 4 MG PO TBDP
4.0000 mg | ORAL_TABLET | Freq: Once | ORAL | Status: AC
  Administered 2017-04-25: 4 mg via ORAL

## 2017-04-25 MED ORDER — SODIUM CHLORIDE 0.9 % IV SOLN
1.0000 g | Freq: Once | INTRAVENOUS | Status: AC
  Administered 2017-04-26: 1 g via INTRAVENOUS
  Filled 2017-04-25: qty 10

## 2017-04-25 MED ORDER — SODIUM CHLORIDE 0.9 % IV BOLUS (SEPSIS)
1000.0000 mL | Freq: Once | INTRAVENOUS | Status: AC
  Administered 2017-04-25: 1000 mL via INTRAVENOUS

## 2017-04-25 MED ORDER — ONDANSETRON 4 MG PO TBDP
ORAL_TABLET | ORAL | Status: AC
  Filled 2017-04-25: qty 1

## 2017-04-25 NOTE — ED Notes (Signed)
Patient transported to X-ray 

## 2017-04-25 NOTE — ED Triage Notes (Signed)
Pt started feeling very sick today with nausea, vomiting and fever she had thyroid surgery last week incision looks clean, no redness noticed. Pt was [facility] for further evaluation.

## 2017-04-25 NOTE — ED Provider Notes (Signed)
Patient placed in Quick Look pathway, seen and evaluated   Chief Complaint: Nausea vomiting  HPI:   37 year old female presents today with complaints of nausea vomiting and fever.  Patient status post thyroidectomy.  Family notes that her calcium was significantly low several days ago and has increased calcium intake.  Patient with nausea vomiting this morning, fever at urgent care sent for further evaluation.  Patient does note a cough  ROS: Fatigue (one)  Physical Exam:   Gen: No distress  Neuro: Awake and Alert  Skin: Warm    Focused Exam: clear lung sounds-surgical incision clean without signs of infection   Initiation of care has begun. The patient has been counseled on the process, plan, and necessity for staying for the completion/evaluation, and the remainder of the medical screening examination    Selena Scott 04/25/17 2104    Tegeler, Gwenyth Allegra, MD 04/25/17 2211

## 2017-04-25 NOTE — ED Notes (Signed)
Gave pt Zofran and a bottle of Gatorade and sent pt the ED.  Spoke with Dr. Mannie Stabile and he agreed that pt should go to ED for further evaluation for sepsis post surgery.

## 2017-04-25 NOTE — ED Triage Notes (Signed)
Pt reports N, V, & D since this morning.  Pt had thyroid surgery last Thursday.

## 2017-04-25 NOTE — ED Provider Notes (Signed)
[place] EMERGENCY DEPARTMENT Provider Note   CSN: [id number] Arrival date & time: 04/25/17  2024     History   Chief Complaint Chief Complaint  Patient presents with  . Fever    HPI Selena Scott is a 37 y.o. female.  The history is provided by the patient. No language interpreter was used.  Fever      Selena Scott is a 37 y.o. female who presents to the Emergency Department complaining of fever, vomiting.  She is postop day 6 from total thyroidectomy here for evaluation of vomiting and diarrhea.  She was doing well postoperatively outside of some paresthesias of her face, arms, legs.  This morning when she woke she had severe nausea, vomiting diarrhea.  She reports 4 episodes of emesis was too numerous to count episodes of diarrhea.  She went to urgent care today for evaluation and was noted to be febrile to 101 and was referred to the emergency department.  She was unaware of fevers at home.  She does report some central abdominal discomfort that she relates secondary to vomiting.  She has mild cough and nasal congestion.  No sore throat.  No dysuria.  She has been taking calcium supplementation following surgery, no additional medications.  Past Medical History:  Diagnosis Date  . Anemia   . Fibroid   . GERD (gastroesophageal reflux disease)    hx of   . HPV (human papilloma virus) anogenital infection   . Medical history non-contributory   . Thyroid disease     Patient Active Problem List   Diagnosis Date Noted  . Hypocalcemia 04/26/2017  . Viral gastroenteritis 04/26/2017  . Nausea vomiting and diarrhea   . Thyroid goiter 04/19/2017  . Low TSH level 12/06/2016  . Multinodular goiter 11/17/2016  . Indication for care in labor or delivery 01/13/2016  . SVD (spontaneous vaginal delivery) 01/13/2016  . Advanced maternal age in multigravida 08/06/2015    Past Surgical History:  Procedure Laterality Date  . NO PAST SURGERIES    . THYROIDECTOMY  N/A 04/19/2017   Procedure: TOTAL THYROIDECTOMY;  Surgeon: Armandina Gemma, MD;  Location: [facility];  Service: General;  Laterality: N/A;    OB History    Gravida Para Term Preterm AB Living   3 3 3     3    SAB TAB Ectopic Multiple Live Births         0 3       Home Medications    Prior to Admission medications   Medication Sig Start Date End Date Taking? Authorizing Provider  calcitRIOL (ROCALTROL) 0.25 MCG capsule Take 1 capsule (0.25 mcg total) by mouth daily. Patient taking differently: Take 0.5 mcg by mouth daily.  04/20/17  Yes Armandina Gemma, MD  calcium carbonate (TUMS) 500 MG chewable tablet Chew 2 tablets (400 mg of elemental calcium total) by mouth 4 (four) times daily. 04/20/17  Yes Armandina Gemma, MD  fluticasone (FLONASE) 50 MCG/ACT nasal spray Place 1 spray into both nostrils daily as needed for allergies or rhinitis.   Yes [provider]  levothyroxine (SYNTHROID) 88 MCG tablet Take 1 tablet (88 mcg total) by mouth daily before breakfast. 04/20/17  Yes Armandina Gemma, MD  traMADol (ULTRAM) 50 MG tablet Take 1-2 tablets (50-100 mg total) by mouth every 6 (six) hours as needed for moderate pain. 04/20/17  Yes Armandina Gemma, MD    Family History Family History  Problem Relation Age of Onset  . Thyroid disease Neg Hx  Social History Social History   Tobacco Use  . Smoking status: Never Smoker  . Smokeless tobacco: Never Used  Substance Use Topics  . Alcohol use: No  . Drug use: No     Allergies   Patient has no known allergies.   Review of Systems Review of Systems  Constitutional: Positive for fever.  All other systems reviewed and are negative.    Physical Exam Updated Vital Signs BP 97/64   Pulse 94   Temp 99.6 F (37.6 C) (Oral)   Resp (!) 21   Ht 5\' 3"  (1.6 m)   Wt 69.4 kg (153 lb)   LMP 04/03/2017 (Approximate)   SpO2 100%   BMI 27.10 kg/m   Physical Exam  Constitutional: She is oriented to person, place, and time. She appears  well-developed and well-nourished.  HENT:  Head: Normocephalic and atraumatic.  Dry mucous membranes.  Neck:  Anterior neck wound with Steri-Strips in place, clean, dry, intact no significant edema or erythema.  Cardiovascular: Regular rhythm.  No murmur heard. tachycardic  Pulmonary/Chest: Effort normal and breath sounds normal. No stridor. No respiratory distress.  Abdominal: Soft. There is no rebound and no guarding.  Mild diffuse abdominal tenderness  Musculoskeletal: She exhibits no edema or tenderness.  Neurological: She is alert and oriented to person, place, and time.  Skin: Skin is warm and dry.  Psychiatric: She has a normal mood and affect. Her behavior is normal.  Nursing note and vitals reviewed.    ED Treatments / Results  Labs (all labs ordered are listed, but only abnormal results are displayed) Labs Reviewed  COMPREHENSIVE METABOLIC PANEL - Abnormal; Notable for the following components:      Result Value   CO2 20 (*)    Glucose, Bld 119 (*)    Calcium 6.6 (*)    All other components within normal limits  URINALYSIS, ROUTINE W REFLEX MICROSCOPIC - Abnormal; Notable for the following components:   APPearance HAZY (*)    Ketones, ur 5 (*)    All other components within normal limits  CULTURE, BLOOD (ROUTINE X 2)  CULTURE, BLOOD (ROUTINE X 2)  CBC WITH DIFFERENTIAL/PLATELET  PROTIME-INR  CALCIUM, IONIZED  BASIC METABOLIC PANEL  CBC  I-STAT CG4 LACTIC ACID, ED  I-STAT BETA HCG BLOOD, ED ([facility], [facility], [facility] ONLY)    EKG  EKG Interpretation  Date/Time:  Thursday April 26 2017 00:06:33 EST Ventricular Rate:  96 PR Interval:    QRS Duration: 82 QT Interval:  376 QTC Calculation: 476 R Axis:   32 Text Interpretation:  Sinus rhythm Abnormal R-wave progression, early transition Baseline wander in lead(s) V2 V4 Confirmed by Quintella Reichert [phone]) on 04/26/2017 12:12:07 AM       Radiology Dg Chest 2 View  Result Date: 04/25/2017 CLINICAL DATA:  Sepsis,  fever, nausea, vomiting, and diarrhea all day. Surgery to remove thyroid gland 1 week ago. Nonsmoker. EXAM: CHEST  2 VIEW COMPARISON:  04/13/2017 FINDINGS: Normal heart size and pulmonary vascularity. No focal airspace disease or consolidation in the lungs. No blunting of costophrenic angles. No pneumothorax. Mediastinal contours appear intact. Surgical clips in the base of the neck. IMPRESSION: No active cardiopulmonary disease. Electronically Signed   By: Lucienne Capers M.D.   On: 04/25/2017 21:24    Procedures Procedures (including critical care time)  Medications Ordered in ED Medications  calcitRIOL (ROCALTROL) capsule 0.25 mcg (not administered)  calcium carbonate (TUMS - dosed in mg elemental calcium) chewable tablet 400 mg of elemental  calcium (not administered)  levothyroxine (SYNTHROID, LEVOTHROID) tablet 88 mcg (not administered)  traMADol (ULTRAM) tablet 50-100 mg (not administered)  0.9 %  sodium chloride infusion (100 mL/hr Intravenous New Bag/Given 04/26/17 0048)  ondansetron (ZOFRAN) tablet 4 mg (not administered)    Or  ondansetron (ZOFRAN) injection 4 mg (not administered)  sodium chloride 0.9 % bolus 1,000 mL (0 mLs Intravenous Stopped 04/25/17 2316)  calcium gluconate 1 g in sodium chloride 0.9 % 100 mL IVPB (1 g Intravenous New Bag/Given 04/26/17 0048)     Initial Impression / Assessment and Plan / ED Course  I have reviewed the triage vital signs and the nursing notes.  Pertinent labs & imaging results that were available during my care of the patient were reviewed by me and considered in my medical decision making (see chart for details).     Patient with history of recent thyroidectomy here with vomiting and diarrhea.  She is nontoxic appearing on examination.  Abdominal examination is benign, no concern for acute abdomen.  Labs demonstrate hypocalcemia, patient does have ongoing paresthesias and had difficulty taking her oral calcium at home due to vomiting and  diarrhea.  Will treat with IV calcium.  Medicine consulted for observation admission for IV fluid hydration  Final Clinical Impressions(s) / ED Diagnoses   Final diagnoses:  Hypocalcemia  Nausea vomiting and diarrhea    ED Discharge Orders    None       Quintella Reichert, MD 04/26/17 0139

## 2017-04-26 ENCOUNTER — Encounter (HOSPITAL_COMMUNITY): Payer: Self-pay | Admitting: General Practice

## 2017-04-26 DIAGNOSIS — A084 Viral intestinal infection, unspecified: Secondary | ICD-10-CM | POA: Diagnosis present

## 2017-04-26 DIAGNOSIS — E049 Nontoxic goiter, unspecified: Secondary | ICD-10-CM

## 2017-04-26 DIAGNOSIS — R112 Nausea with vomiting, unspecified: Secondary | ICD-10-CM | POA: Insufficient documentation

## 2017-04-26 DIAGNOSIS — R197 Diarrhea, unspecified: Secondary | ICD-10-CM

## 2017-04-26 LAB — BASIC METABOLIC PANEL
ANION GAP: 10 (ref 5–15)
Anion gap: 10 (ref 5–15)
BUN: 7 mg/dL (ref 6–20)
BUN: <5 mg/dL — ABNORMAL LOW (ref 6–20)
CHLORIDE: 108 mmol/L (ref 101–111)
CHLORIDE: 109 mmol/L (ref 101–111)
CO2: 20 mmol/L — ABNORMAL LOW (ref 22–32)
CO2: 18 mmol/L — ABNORMAL LOW (ref 22–32)
Calcium: 5.5 mg/dL — CL (ref 8.9–10.3)
Calcium: 6.3 mg/dL — CL (ref 8.9–10.3)
Creatinine, Ser: 0.81 mg/dL (ref 0.44–1.00)
Creatinine, Ser: 0.8 mg/dL (ref 0.44–1.00)
GFR calc Af Amer: >60 mL/min (ref >60)
GFR calc non Af Amer: >60 mL/min (ref >60)
Glucose, Bld: 95 mg/dL (ref 65–99)
Glucose, Bld: 108 mg/dL — ABNORMAL HIGH (ref 65–99)
POTASSIUM: 3.4 mmol/L — AB (ref 3.5–5.1)
POTASSIUM: 3.8 mmol/L (ref 3.5–5.1)
SODIUM: 138 mmol/L (ref 135–145)
SODIUM: 137 mmol/L (ref 135–145)

## 2017-04-26 LAB — BASIC METABOLIC PANEL WITH GFR
Anion gap: 10 (ref 5–15)
Anion gap: 9 (ref 5–15)
BUN: 7 mg/dL (ref 6–20)
BUN: 5 mg/dL — ABNORMAL LOW (ref 6–20)
CO2: 19 mmol/L — ABNORMAL LOW (ref 22–32)
CO2: 22 mmol/L (ref 22–32)
Calcium: 5.9 mg/dL — CL (ref 8.9–10.3)
Calcium: 5.9 mg/dL — CL (ref 8.9–10.3)
Chloride: 108 mmol/L (ref 101–111)
Chloride: 108 mmol/L (ref 101–111)
Creatinine, Ser: 0.89 mg/dL (ref 0.44–1.00)
Creatinine, Ser: 0.79 mg/dL (ref 0.44–1.00)
GFR calc Af Amer: >60 mL/min
GFR calc Af Amer: >60 mL/min
GFR calc non Af Amer: >60 mL/min
GFR calc non Af Amer: >60 mL/min
Glucose, Bld: 119 mg/dL — ABNORMAL HIGH (ref 65–99)
Glucose, Bld: 105 mg/dL — ABNORMAL HIGH (ref 65–99)
Potassium: 3.4 mmol/L — ABNORMAL LOW (ref 3.5–5.1)
Potassium: 3.7 mmol/L (ref 3.5–5.1)
Sodium: 137 mmol/L (ref 135–145)
Sodium: 139 mmol/L (ref 135–145)

## 2017-04-26 LAB — CBC
HCT: 33.5 % — ABNORMAL LOW (ref 36.0–46.0)
Hemoglobin: 11.5 g/dL — ABNORMAL LOW (ref 12.0–15.0)
MCH: 28.3 pg (ref 26.0–34.0)
MCHC: 34.3 g/dL (ref 30.0–36.0)
MCV: 82.5 fL (ref 78.0–100.0)
Platelets: 246 10*3/uL (ref 150–400)
RBC: 4.06 MIL/uL (ref 3.87–5.11)
RDW: 13.4 % (ref 11.5–15.5)
WBC: 5.5 10*3/uL (ref 4.0–10.5)

## 2017-04-26 LAB — MAGNESIUM
MAGNESIUM: 1.8 mg/dL (ref 1.7–2.4)
Magnesium: 1.4 mg/dL — ABNORMAL LOW (ref 1.7–2.4)

## 2017-04-26 MED ORDER — CALCITRIOL 0.25 MCG PO CAPS
0.2500 ug | ORAL_CAPSULE | Freq: Once | ORAL | Status: AC
  Administered 2017-04-26: 0.25 ug via ORAL
  Filled 2017-04-26: qty 1

## 2017-04-26 MED ORDER — POTASSIUM CHLORIDE CRYS ER 20 MEQ PO TBCR
60.0000 meq | EXTENDED_RELEASE_TABLET | Freq: Once | ORAL | Status: AC
  Administered 2017-04-26: 60 meq via ORAL
  Filled 2017-04-26: qty 3

## 2017-04-26 MED ORDER — SODIUM CHLORIDE 0.9 % IV SOLN
INTRAVENOUS | Status: DC
  Administered 2017-04-26: 100 mL/h via INTRAVENOUS
  Administered 2017-04-26 – 2017-04-28 (×2): via INTRAVENOUS

## 2017-04-26 MED ORDER — CALCIUM CARBONATE ANTACID 500 MG PO CHEW
2.0000 | CHEWABLE_TABLET | Freq: Four times a day (QID) | ORAL | Status: DC
  Administered 2017-04-26 – 2017-04-27 (×5): 400 mg via ORAL
  Filled 2017-04-26 (×7): qty 2

## 2017-04-26 MED ORDER — SODIUM CHLORIDE 0.9 % IV SOLN
1.0000 g | Freq: Once | INTRAVENOUS | Status: AC
  Administered 2017-04-26: 1 g via INTRAVENOUS
  Filled 2017-04-26: qty 10

## 2017-04-26 MED ORDER — SODIUM CHLORIDE 0.9 % IV SOLN: 1.0000 g | Freq: Once | INTRAVENOUS | Status: DC

## 2017-04-26 MED ORDER — ONDANSETRON HCL 4 MG PO TABS: 4.0000 mg | ORAL_TABLET | Freq: Four times a day (QID) | ORAL | Status: DC | PRN

## 2017-04-26 MED ORDER — CALCITRIOL 0.25 MCG PO CAPS
0.2500 ug | ORAL_CAPSULE | Freq: Every day | ORAL | Status: DC
  Administered 2017-04-26: 0.25 ug via ORAL
  Filled 2017-04-26: qty 1

## 2017-04-26 MED ORDER — TRAMADOL HCL 50 MG PO TABS
50.0000 mg | ORAL_TABLET | Freq: Four times a day (QID) | ORAL | Status: DC | PRN
  Administered 2017-04-26 (×2): 50 mg via ORAL
  Filled 2017-04-26 (×2): qty 1

## 2017-04-26 MED ORDER — ONDANSETRON HCL 4 MG/2ML IJ SOLN
4.0000 mg | Freq: Four times a day (QID) | INTRAMUSCULAR | Status: DC | PRN
  Administered 2017-04-26: 4 mg via INTRAVENOUS
  Filled 2017-04-26: qty 2

## 2017-04-26 MED ORDER — MAGNESIUM SULFATE 2 GM/50ML IV SOLN
2.0000 g | Freq: Once | INTRAVENOUS | Status: AC
  Administered 2017-04-26: 2 g via INTRAVENOUS
  Filled 2017-04-26: qty 50

## 2017-04-26 MED ORDER — LEVOTHYROXINE SODIUM 88 MCG PO TABS
88.0000 ug | ORAL_TABLET | Freq: Every day | ORAL | Status: DC
  Administered 2017-04-26 – 2017-04-30 (×5): 88 ug via ORAL
  Filled 2017-04-26 (×5): qty 1

## 2017-04-26 MED ORDER — CALCITRIOL 0.25 MCG PO CAPS
0.5000 ug | ORAL_CAPSULE | Freq: Every day | ORAL | Status: DC
  Administered 2017-04-27: 0.5 ug via ORAL
  Filled 2017-04-26 (×2): qty 2

## 2017-04-26 NOTE — ED Notes (Signed)
Clear Liquid Diet ordered for Lunch.

## 2017-04-26 NOTE — H&P (Signed)
History and Physical    Selena Scott ZJI:[id number] DOB: 1980-06-28 DOA: 04/25/2017  PCP: Levin Bacon, NP  Patient coming from: Home  Chief Complaint: Nausea vomiting  HPI: Selena Scott is a 37 y.o. female with medical history significant of total thyroidectomy and parathyroidectomy this past week for multinodular goiter comes in with nausea vomiting and diarrhea that started this morning.  Patient reports a low-grade fever.  She has had no sick contacts.  She denies any abdominal pain.  She was called yesterday to increase her calcium orally as her postop calcium levels were low.  She has not been able to hold down any of her medications today.  Patient came in because of her persistent nausea and vomiting.  She has been having some tingling in her face.  Patient is referred for admission for calcium level of 6.6 and for the need for IV replacement.  She has not had any vomiting since arrival to the ED.  And she is overall improved.  Review of Systems: As per HPI otherwise 10 point review of systems negative.   Past Medical History:  Diagnosis Date  . Anemia   . Fibroid   . GERD (gastroesophageal reflux disease)    hx of   . HPV (human papilloma virus) anogenital infection   . Medical history non-contributory   . Thyroid disease     Past Surgical History:  Procedure Laterality Date  . NO PAST SURGERIES    . THYROIDECTOMY N/A 04/19/2017   Procedure: TOTAL THYROIDECTOMY;  Surgeon: Armandina Gemma, MD;  Location: [facility];  Service: General;  Laterality: N/A;     reports that  has never smoked. she has never used smokeless tobacco. She reports that she does not drink alcohol or use drugs.  No Known Allergies  Family History  Problem Relation Age of Onset  . Thyroid disease Neg Hx     Prior to Admission medications   Medication Sig Start Date End Date Taking? Authorizing Provider  calcitRIOL (ROCALTROL) 0.25 MCG capsule Take 1 capsule (0.25 mcg total) by mouth daily. 04/20/17    Armandina Gemma, MD  calcium carbonate (TUMS) 500 MG chewable tablet Chew 2 tablets (400 mg of elemental calcium total) by mouth 4 (four) times daily. 04/20/17   Armandina Gemma, MD  fluticasone (FLONASE) 50 MCG/ACT nasal spray Place 1 spray into both nostrils daily as needed for allergies or rhinitis.    [provider]  levothyroxine (SYNTHROID) 88 MCG tablet Take 1 tablet (88 mcg total) by mouth daily before breakfast. 04/20/17   Armandina Gemma, MD  Polyethyl Glycol-Propyl Glycol (SYSTANE) 0.4-0.3 % GEL ophthalmic gel Place 1 application into both eyes at bedtime as needed. Patient not taking: Reported on 04/09/2017 03/25/17   Ok Edwards, PA-C  Polyethyl Glycol-Propyl Glycol (SYSTANE) 0.4-0.3 % SOLN Apply 1-2 drops to eye 4 (four) times daily as needed. Patient not taking: Reported on 04/09/2017 03/25/17   Ok Edwards, PA-C  traMADol (ULTRAM) 50 MG tablet Take 1-2 tablets (50-100 mg total) by mouth every 6 (six) hours as needed for moderate pain. 04/20/17   Armandina Gemma, MD    Physical Exam: Vitals:   04/25/17 2032 04/25/17 2112 04/25/17 2145 04/25/17 2300  BP:   110/68 99/64  Pulse:   92 87  Resp:   (!) 22 17  Temp:      TempSrc:      SpO2:   100% 100%  Weight: 66.2 kg (146 lb) 69.4 kg (153 lb)  Height: 5\' 3"  (1.6 m) 5\' 3"  (1.6 m)        Constitutional: NAD, calm, comfortable Vitals:   04/25/17 2032 04/25/17 2112 04/25/17 2145 04/25/17 2300  BP:   110/68 99/64  Pulse:   92 87  Resp:   (!) 22 17  Temp:      TempSrc:      SpO2:   100% 100%  Weight: 66.2 kg (146 lb) 69.4 kg (153 lb)    Height: 5\' 3"  (1.6 m) 5\' 3"  (1.6 m)     Eyes: PERRL, lids and conjunctivae normal ENMT: Mucous membranes are moist. Posterior pharynx clear of any exudate or lesions.Normal dentition.  Neck: normal, supple, no masses, no thyromegaly incision clean dry intact with no erythema and well healing Respiratory: clear to auscultation bilaterally, no wheezing, no crackles. Normal respiratory effort. No  accessory muscle use.  Cardiovascular: Regular rate and rhythm, no murmurs / rubs / gallops. No extremity edema. 2+ pedal pulses. No carotid bruits.  Abdomen: no tenderness, no masses palpated. No hepatosplenomegaly. Bowel sounds positive.  Musculoskeletal: no clubbing / cyanosis. No joint deformity upper and lower extremities. Good ROM, no contractures. Normal muscle tone.  Skin: no rashes, lesions, ulcers. No induration Neurologic: CN 2-12 grossly intact. Sensation intact, DTR normal. Strength 5/5 in all 4.  Psychiatric: Normal judgment and insight. Alert and oriented x 3. Normal mood.    Labs on Admission: I have personally reviewed following labs and imaging studies  CBC: Recent Labs  Lab 04/25/17 2055  WBC 6.7  NEUTROABS 5.8  HGB 13.1  HCT 38.2  MCV 82.7  PLT 623   Basic Metabolic Panel: Recent Labs  Lab 04/20/17 0440 04/25/17 2055  NA 138 137  K 3.8 3.6  CL 106 103  CO2 25 20*  GLUCOSE 119* 119*  BUN 8 9  CREATININE 0.74 0.88  CALCIUM 8.0* 6.6*   GFR: Estimated Creatinine Clearance: 81 mL/min (by C-G formula based on SCr of 0.88 mg/dL). Liver Function Tests: Recent Labs  Lab 04/25/17 2055  AST 22  ALT 15  ALKPHOS 69  BILITOT 0.9  PROT 6.9  ALBUMIN 3.8   No results for input(s): LIPASE, AMYLASE in the last 168 hours. No results for input(s): AMMONIA in the last 168 hours. Coagulation Profile: Recent Labs  Lab 04/25/17 2055  INR 0.98   Cardiac Enzymes: No results for input(s): CKTOTAL, CKMB, CKMBINDEX, TROPONINI in the last 168 hours. BNP (last 3 results) No results for input(s): PROBNP in the last 8760 hours. HbA1C: No results for input(s): HGBA1C in the last 72 hours. CBG: No results for input(s): GLUCAP in the last 168 hours. Lipid Profile: No results for input(s): CHOL, HDL, LDLCALC, TRIG, CHOLHDL, LDLDIRECT in the last 72 hours. Thyroid Function Tests: No results for input(s): TSH, T4TOTAL, FREET4, T3FREE, THYROIDAB in the last 72  hours. Anemia Panel: No results for input(s): VITAMINB12, FOLATE, FERRITIN, TIBC, IRON, RETICCTPCT in the last 72 hours. Urine analysis:    Component Value Date/Time   COLORURINE YELLOW 04/25/2017 2033   APPEARANCEUR HAZY (A) 04/25/2017 2033   LABSPEC 1.028 04/25/2017 2033   PHURINE 5.0 04/25/2017 2033   GLUCOSEU NEGATIVE 04/25/2017 2033   HGBUR NEGATIVE 04/25/2017 2033   [place] NEGATIVE 04/25/2017 2033   KETONESUR 5 (A) 04/25/2017 2033   PROTEINUR NEGATIVE 04/25/2017 2033   NITRITE NEGATIVE 04/25/2017 2033   LEUKOCYTESUR NEGATIVE 04/25/2017 2033   Sepsis Labs: !!!!!!!!!!!!!!!!!!!!!!!!!!!!!!!!!!!!!!!!!!!! @LABRCNTIP (procalcitonin:4,lacticidven:4) )No results found for this or any previous visit (from the past  240 hour(s)).   Radiological Exams on Admission: Dg Chest 2 View  Result Date: 04/25/2017 CLINICAL DATA:  Sepsis, fever, nausea, vomiting, and diarrhea all day. Surgery to remove thyroid gland 1 week ago. Nonsmoker. EXAM: CHEST  2 VIEW COMPARISON:  04/13/2017 FINDINGS: Normal heart size and pulmonary vascularity. No focal airspace disease or consolidation in the lungs. No blunting of costophrenic angles. No pneumothorax. Mediastinal contours appear intact. Surgical clips in the base of the neck. IMPRESSION: No active cardiopulmonary disease. Electronically Signed   By: Lucienne Capers M.D.   On: 04/25/2017 21:24    EKG: Independently reviewed.  Normal sinus rhythm no acute changes Old chart reviewed Case discussed with EDP  Assessment/Plan 37 year old female with recent total thyroidectomy and parathyroidectomy comes in with GI illness and found to be hypocalcemic Principal Problem:   Hypocalcemia-give calcium gluconate 2 g IV tonight.  Check ionized calcium level.  Continue oral supplementation.  Repeat in the morning.  Office on telemetry.  Active Problems:   Thyroid goiter-noted   Viral gastroenteritis-abdominal exam is benign likely viral.  Symptoms seem to be  improving.  Full liquid diet.     DVT prophylaxis: SCDs Code Status: Full Family Communication: Husband Disposition Plan: Per day team Consults called: None Admission status: Observation   Nishita Isaacks A MD [facility]  If 7PM-7AM, please contact night-coverage [email or web address] Password [facility]  04/26/2017, 12:39 AM

## 2017-04-26 NOTE — Progress Notes (Signed)
Patient ID: Selena Scott, female   DOB: [birth year], 37 y.o.   MRN: [id number] Patient was admitted early this morning for nausea, vomiting and hypocalcemia. Patient seen and examined at bedside and plan of care was discussed with her. Repeat labs this AM shows calcium of 5.5. Will replace with iv calcium gluconate and repeat labs this afternoon and in AM. Continue oral calcitriol and calcium carbonate. Also replace potassium orally. Currently patient is not nauseous. Advance diet as tolerated.

## 2017-04-26 NOTE — Progress Notes (Addendum)
CRITICAL VALUE ALERT  Critical Value:  Calcium 5.9  Date & Time Notied:  04/26/2017 at 1700  Provider Notified:K. Alekh,MD  Orders Received/Actions taken: will await new orders Nevin Bloodgood R   New orders for 1 g of calcium gluconate & 2g of Magnesium.

## 2017-04-26 NOTE — ED Notes (Signed)
Admitting called back and is aware of calcium levels awaiting further orders.

## 2017-04-26 NOTE — ED Notes (Signed)
Calcium 5.5 admitting paged.

## 2017-04-26 NOTE — ED Notes (Signed)
Pharmacy notified for calcium

## 2017-04-26 NOTE — ED Notes (Signed)
Pt states she would like to go home and is upset about being in the hospital. Emotional support given to patient. Instructed patient that we would take care of her and monitor her calcium levels so she could get feeling better. Offered to page admitting doctor regarding her feelings of wanting to go home. Pt declined and states she will stay so she can feel better.  Telephone provided to patient to contact family. Resting in bed quietly. Will continue to monitor.

## 2017-04-26 NOTE — ED Notes (Signed)
Date and time results received: 04/26/17 (use smartphrase ".now" to insert current time)  Test:Calcium Critical Value: 5.9

## 2017-04-27 DIAGNOSIS — R197 Diarrhea, unspecified: Secondary | ICD-10-CM | POA: Diagnosis not present

## 2017-04-27 DIAGNOSIS — A084 Viral intestinal infection, unspecified: Secondary | ICD-10-CM | POA: Diagnosis present

## 2017-04-27 DIAGNOSIS — K219 Gastro-esophageal reflux disease without esophagitis: Secondary | ICD-10-CM | POA: Diagnosis present

## 2017-04-27 DIAGNOSIS — Z79899 Other long term (current) drug therapy: Secondary | ICD-10-CM | POA: Diagnosis not present

## 2017-04-27 DIAGNOSIS — R112 Nausea with vomiting, unspecified: Secondary | ICD-10-CM | POA: Diagnosis not present

## 2017-04-27 DIAGNOSIS — E049 Nontoxic goiter, unspecified: Secondary | ICD-10-CM | POA: Diagnosis not present

## 2017-04-27 LAB — COMPREHENSIVE METABOLIC PANEL
ALBUMIN: 3 g/dL — AB (ref 3.5–5.0)
ALK PHOS: 54 U/L (ref 38–126)
ALT: 14 U/L (ref 14–54)
AST: 19 U/L (ref 15–41)
Anion gap: 7 (ref 5–15)
BILIRUBIN TOTAL: 0.7 mg/dL (ref 0.3–1.2)
CALCIUM: 6.2 mg/dL — AB (ref 8.9–10.3)
CO2: 23 mmol/L (ref 22–32)
Chloride: 107 mmol/L (ref 101–111)
Creatinine, Ser: 0.84 mg/dL (ref 0.44–1.00)
GFR calc Af Amer: >60 mL/min (ref >60)
GLUCOSE: 91 mg/dL (ref 65–99)
POTASSIUM: 3.6 mmol/L (ref 3.5–5.1)
Sodium: 137 mmol/L (ref 135–145)
TOTAL PROTEIN: 5.5 g/dL — AB (ref 6.5–8.1)

## 2017-04-27 LAB — CBC WITH DIFFERENTIAL/PLATELET
BASOS ABS: 0 10*3/uL (ref 0.0–0.1)
Basophils Relative: 0 %
Eosinophils Absolute: 0.2 10*3/uL (ref 0.0–0.7)
Eosinophils Relative: 5 %
HEMATOCRIT: 32.2 % — AB (ref 36.0–46.0)
HEMOGLOBIN: 10.7 g/dL — AB (ref 12.0–15.0)
LYMPHS PCT: 43 %
Lymphs Abs: 2 10*3/uL (ref 0.7–4.0)
MCH: 27.4 pg (ref 26.0–34.0)
MCHC: 33.2 g/dL (ref 30.0–36.0)
MCV: 82.6 fL (ref 78.0–100.0)
MONO ABS: 0.5 10*3/uL (ref 0.1–1.0)
MONOS PCT: 10 %
NEUTROS ABS: 1.9 10*3/uL (ref 1.7–7.7)
NEUTROS PCT: 42 %
Platelets: 255 10*3/uL (ref 150–400)
RBC: 3.9 MIL/uL (ref 3.87–5.11)
RDW: 13.5 % (ref 11.5–15.5)
WBC: 4.6 10*3/uL (ref 4.0–10.5)

## 2017-04-27 LAB — BASIC METABOLIC PANEL
ANION GAP: 10 (ref 5–15)
ANION GAP: 11 (ref 5–15)
Anion gap: 9 (ref 5–15)
BUN: <5 mg/dL — ABNORMAL LOW (ref 6–20)
BUN: <5 mg/dL — ABNORMAL LOW (ref 6–20)
CHLORIDE: 107 mmol/L (ref 101–111)
CHLORIDE: 106 mmol/L (ref 101–111)
CO2: 21 mmol/L — AB (ref 22–32)
CO2: 22 mmol/L (ref 22–32)
CO2: 22 mmol/L (ref 22–32)
CREATININE: 0.75 mg/dL (ref 0.44–1.00)
Calcium: 7 mg/dL — ABNORMAL LOW (ref 8.9–10.3)
Calcium: 6.8 mg/dL — ABNORMAL LOW (ref 8.9–10.3)
Calcium: 8.1 mg/dL — ABNORMAL LOW (ref 8.9–10.3)
Chloride: 105 mmol/L (ref 101–111)
Creatinine, Ser: 0.71 mg/dL (ref 0.44–1.00)
Creatinine, Ser: 0.78 mg/dL (ref 0.44–1.00)
GFR calc Af Amer: >60 mL/min (ref >60)
GFR calc Af Amer: >60 mL/min (ref >60)
GFR calc Af Amer: >60 mL/min (ref >60)
GFR calc non Af Amer: >60 mL/min (ref >60)
GFR calc non Af Amer: >60 mL/min (ref >60)
Glucose, Bld: 99 mg/dL (ref 65–99)
Glucose, Bld: 93 mg/dL (ref 65–99)
Glucose, Bld: 95 mg/dL (ref 65–99)
POTASSIUM: 3.7 mmol/L (ref 3.5–5.1)
POTASSIUM: 3.8 mmol/L (ref 3.5–5.1)
Potassium: 3.8 mmol/L (ref 3.5–5.1)
SODIUM: 137 mmol/L (ref 135–145)
Sodium: 138 mmol/L (ref 135–145)
Sodium: 138 mmol/L (ref 135–145)

## 2017-04-27 LAB — MAGNESIUM
MAGNESIUM: 1.5 mg/dL — AB (ref 1.7–2.4)
Magnesium: 2.1 mg/dL (ref 1.7–2.4)

## 2017-04-27 LAB — CALCIUM, IONIZED
Calcium, Ionized, Serum: 3.4 mg/dL — ABNORMAL LOW (ref 4.5–5.6)
Calcium, Ionized, Serum: 3.4 mg/dL — ABNORMAL LOW (ref 4.5–5.6)

## 2017-04-27 LAB — CALCIUM: Calcium: 6.3 mg/dL — CL (ref 8.9–10.3)

## 2017-04-27 MED ORDER — SODIUM CHLORIDE 0.9 % IV SOLN: 1.0000 g | Freq: Once | INTRAVENOUS | Status: DC

## 2017-04-27 MED ORDER — SODIUM CHLORIDE 0.9 % IV SOLN
2.0000 g | Freq: Once | INTRAVENOUS | Status: AC
  Administered 2017-04-27: 2 g via INTRAVENOUS
  Filled 2017-04-27: qty 20

## 2017-04-27 MED ORDER — SODIUM CHLORIDE 0.9 % IV SOLN
3.0000 g | Freq: Once | INTRAVENOUS | Status: AC
  Administered 2017-04-27: 3 g via INTRAVENOUS
  Filled 2017-04-27: qty 30

## 2017-04-27 MED ORDER — CALCIUM GLUCONATE 10 % IV SOLN: 1.0000 g | Freq: Once | INTRAVENOUS | Status: DC

## 2017-04-27 MED ORDER — MAGNESIUM SULFATE 2 GM/50ML IV SOLN
2.0000 g | Freq: Once | INTRAVENOUS | Status: AC
  Administered 2017-04-27: 2 g via INTRAVENOUS
  Filled 2017-04-27 (×2): qty 50

## 2017-04-27 MED ORDER — CALCIUM CARBONATE ANTACID 500 MG PO CHEW
3.0000 | CHEWABLE_TABLET | Freq: Four times a day (QID) | ORAL | Status: DC
  Administered 2017-04-27 – 2017-04-30 (×12): 600 mg via ORAL
  Filled 2017-04-27 (×13): qty 3

## 2017-04-27 MED ORDER — MAGNESIUM OXIDE 400 (241.3 MG) MG PO TABS
400.0000 mg | ORAL_TABLET | Freq: Two times a day (BID) | ORAL | Status: DC
  Administered 2017-04-27 – 2017-04-30 (×6): 400 mg via ORAL
  Filled 2017-04-27 (×6): qty 1

## 2017-04-27 NOTE — Progress Notes (Signed)
General Surgery [facility]  Assessment & Plan: POD#8 - status post total thyroidectomy for multinodular goiter with compressive symptoms  Profound hypocalcemia developing 2-3 days after discharge from thyroidectomy  On oral calcium and Vit D supplements - unable to maintain due to viral gastroenteritis  Admitted to medical service at [facility] due to nausea, vomiting, diarrhea, and hypocalcemia  Patient seen and examined.  Discussed surgical procedure, findings, and pathology report.  Despite what patient has been told by staff, her parathyroid glands were not removed at time of surgery.  Op note describes both superior glands requiring dissection off of thyroid and being preserved on vascular pedicles.  Inferior glands were not separately dissected.  Likely glands are contused and dysfunctional at present.  Path report show benign parathyroid tissue which is not an abnormal finding on these reports and does not quantify how much tissue was identified.  All of this discussed and explained to patient.  Would continue to aggressively replenish calcium with IV calcium gluconate.  Agree with IV magnesium.  Rocaltrol can be given BID if needed.  Await stabilization > 7.5 mg/dl and asymptomatic before discharge home on an oral regimen.  May require oral treatment for up to 6 weeks.  Will monitor as out-patient along with her endocrinologist, Dr. Philemon Kingdom.  My partners will round on her over the weekend.  Please call with any questions.  Thank you for your care and medical management.        Earnstine Regal, MD, [facility]       Office: [phone]    Chief Complaint: Status post thyroidectomy, post op hypocalcemia  Subjective: Patient in bed, comfortable.  Occasional muscle cramp in face, denies paresthesia at present.  GI symptoms improved.  Objective: Vital signs in last 24 hours: Temp:  [98.2 F (36.8 C)-99 F (37.2 C)] 98.2  F (36.8 C) (02/01 1346) Pulse Rate:  [58-75] 75 (02/01 1346) Resp:  [18] 18 (02/01 1346) BP: (93-103)/(55-65) 100/59 (02/01 1346) SpO2:  [99 %-100 %] 100 % (02/01 1346) Last BM Date: 04/25/17(per pt)  Intake/Output from previous day: 01/31 0701 - 02/01 0700 In: 825 [I.V.:825] Out: -  Intake/Output this shift: Total I/O In: 420 [P.O.:420] Out: -   Physical Exam: HEENT - sclerae clear, mucous membranes moist Neck - wound dry and intact; mild STS; voice normal Chest - clear bilaterally Neuro - alert & oriented, no focal deficits; no Chvostek's bilaterally  Lab Results:  Recent Labs    04/26/17 0252 04/27/17 0542  WBC 5.5 4.6  HGB 11.5* 10.7*  HCT 33.5* 32.2*  PLT 246 255   BMET Recent Labs    04/27/17 0542 04/27/17 1118 04/27/17 1528  NA 137 137  --   K 3.6 3.8  --   CL 107 107  --   CO2 23 21*  --   GLUCOSE 91 99  --   BUN <5* <5*  --   CREATININE 0.84 0.75  --   CALCIUM 6.2* 7.0* 6.3*   PT/INR Recent Labs    04/25/17 2055  LABPROT 12.9  INR 0.98   Comprehensive Metabolic Panel:    Component Value Date/Time   NA 137 04/27/2017 1118   NA 137 04/27/2017 0542   K 3.8 04/27/2017 1118   K 3.6 04/27/2017 0542   CL 107 04/27/2017 1118   CL 107 04/27/2017 0542   CO2 21 (L) 04/27/2017 1118   CO2 23  04/27/2017 0542   BUN <5 (L) 04/27/2017 1118   BUN <5 (L) 04/27/2017 0542   CREATININE 0.75 04/27/2017 1118   CREATININE 0.84 04/27/2017 0542   GLUCOSE 99 04/27/2017 1118   GLUCOSE 91 04/27/2017 0542   CALCIUM 6.3 (LL) 04/27/2017 1528   CALCIUM 7.0 (L) 04/27/2017 1118   AST 19 04/27/2017 0542   AST 22 04/25/2017 2055   ALT 14 04/27/2017 0542   ALT 15 04/25/2017 2055   ALKPHOS 54 04/27/2017 0542   ALKPHOS 69 04/25/2017 2055   BILITOT 0.7 04/27/2017 0542   BILITOT 0.9 04/25/2017 2055   PROT 5.5 (L) 04/27/2017 0542   PROT 6.9 04/25/2017 2055   ALBUMIN 3.0 (L) 04/27/2017 0542   ALBUMIN 3.8 04/25/2017 2055    Studies/Results: Dg Chest 2  View  Result Date: 04/25/2017 CLINICAL DATA:  Sepsis, fever, nausea, vomiting, and diarrhea all day. Surgery to remove thyroid gland 1 week ago. Nonsmoker. EXAM: CHEST  2 VIEW COMPARISON:  04/13/2017 FINDINGS: Normal heart size and pulmonary vascularity. No focal airspace disease or consolidation in the lungs. No blunting of costophrenic angles. No pneumothorax. Mediastinal contours appear intact. Surgical clips in the base of the neck. IMPRESSION: No active cardiopulmonary disease. Electronically Signed   By: Lucienne Capers M.D.   On: 04/25/2017 21:24      [place] M 04/27/2017  Patient ID: Jacques Navy, female   DOB: 1981/02/28, 36 y.o.   MRN: [id number]

## 2017-04-27 NOTE — Progress Notes (Signed)
Patient ID: Selena Scott, female   DOB: 04/12/1980, 37 y.o.   MRN: [id number]  PROGRESS NOTE    Renna Kilmer  XKG:[id number] DOB: [birth year] DOA: 04/25/2017 PCP: Levin Bacon, NP   Brief Narrative: 37 year old female admitted for nausea vomiting GI illness which is resolved with persistent hypocalcemia.  Assessment & Plan:   Principal Problem:   Hypocalcemia-calcitriol increased yesterday.  Tums increased  today.  Continue with calcium gluconate IV supplementation.  Magnesium is also been replaced.  Calcium level is really not responding to multiple IV doses however.  If this is not improved by tomorrow we will call endocrinology.  Active Problems:   Thyroid goiter-noted status post total thyroidectomy on January 25   Viral gastroenteritis-this is resolved   DVT prophylaxis: SCDs  code Status: Full  family Communication: None  disposition Plan: Calcium levels improved    Subjective: Feeling better  Objective: Vitals:   04/26/17 1605 04/26/17 2232 04/27/17 0543 04/27/17 1346  BP:  (!) 93/55 103/65 (!) 100/59  Pulse:  70 (!) 58 75  Resp:  18 18 18   Temp:  98.8 F (37.1 C) 99 F (37.2 C) 98.2 F (36.8 C)  TempSrc:  Oral Oral Oral  SpO2:  99% 99% 100%  Weight: 65.8 kg (145 lb 1 oz)     Height: 5\' 3"  (1.6 m)       Intake/Output Summary (Last 24 hours) at 04/27/2017 1703 Last data filed at 04/27/2017 1348 Gross per 24 hour  Intake 1245 ml  Output -  Net 1245 ml   Filed Weights   04/25/17 2032 04/25/17 2112 04/26/17 1605  Weight: 66.2 kg (146 lb) 69.4 kg (153 lb) 65.8 kg (145 lb 1 oz)    Examination:  General exam: Appears calm and comfortable  Respiratory system: Clear to auscultation. Respiratory effort normal. Cardiovascular system: S1 & S2 heard, RRR. No JVD, murmurs, rubs, gallops or clicks. No pedal edema. Gastrointestinal system: Abdomen is nondistended, soft and nontender. No organomegaly or masses felt. Normal bowel sounds heard. Central nervous system: Alert  and oriented. No focal neurological deficits. Extremities: Symmetric 5 x 5 power. Skin: No rashes, lesions or ulcers Psychiatry: Judgement and insight appear normal. Mood & affect appropriate.     Data Reviewed: I have personally reviewed following labs and imaging studies  CBC: Recent Labs  Lab 04/25/17 2055 04/26/17 0252 04/27/17 0542  WBC 6.7 5.5 4.6  NEUTROABS 5.8  --  1.9  HGB 13.1 11.5* 10.7*  HCT 38.2 33.5* 32.2*  MCV 82.7 82.5 82.6  PLT 291 246 702   Basic Metabolic Panel: Recent Labs  Lab 04/26/17 0918 04/26/17 1540 04/26/17 2125 04/27/17 0542 04/27/17 1118 04/27/17 1528  NA 138 139 137 137 137  --   K 3.4* 3.7 3.8 3.6 3.8  --   CL 108 108 109 107 107  --   CO2 20* 22 18* 23 21*  --   GLUCOSE 95 105* 108* 91 99  --   BUN 7 5* <5* <5* <5*  --   CREATININE 0.81 0.79 0.80 0.84 0.75  --   CALCIUM 5.5* 5.9* 6.3* 6.2* 7.0* 6.3*  MG  --  1.4* 1.8 1.5*  --  2.1   GFR: Estimated Creatinine Clearance: 87 mL/min (by C-G formula based on SCr of 0.75 mg/dL). Liver Function Tests: Recent Labs  Lab 04/25/17 2055 04/27/17 0542  AST 22 19  ALT 15 14  ALKPHOS 69 54  BILITOT 0.9 0.7  PROT 6.9 5.5*  ALBUMIN 3.8  3.0*   No results for input(s): LIPASE, AMYLASE in the last 168 hours. No results for input(s): AMMONIA in the last 168 hours. Coagulation Profile: Recent Labs  Lab 04/25/17 2055  INR 0.98   Cardiac Enzymes: No results for input(s): CKTOTAL, CKMB, CKMBINDEX, TROPONINI in the last 168 hours. BNP (last 3 results) No results for input(s): PROBNP in the last 8760 hours. HbA1C: No results for input(s): HGBA1C in the last 72 hours. CBG: No results for input(s): GLUCAP in the last 168 hours. Lipid Profile: No results for input(s): CHOL, HDL, LDLCALC, TRIG, CHOLHDL, LDLDIRECT in the last 72 hours. Thyroid Function Tests: No results for input(s): TSH, T4TOTAL, FREET4, T3FREE, THYROIDAB in the last 72 hours. Anemia Panel: No results for input(s):  VITAMINB12, FOLATE, FERRITIN, TIBC, IRON, RETICCTPCT in the last 72 hours. Sepsis Labs: Recent Labs  Lab 04/25/17 2108  LATICACIDVEN 0.97    Recent Results (from the past 240 hour(s))  Blood culture (routine x 2)     Status: None (Preliminary result)   Collection Time: 04/25/17 12:08 AM  Result Value Ref Range Status   Specimen Description BLOOD LEFT ARM  Final   Special Requests   Final    BOTTLES DRAWN AEROBIC AND ANAEROBIC Blood Culture adequate volume   Culture   Final    NO GROWTH 2 DAYS Performed at [facility], [address] [place], [place] [address]    Report Status PENDING  Incomplete  Blood culture (routine x 2)     Status: None (Preliminary result)   Collection Time: 04/25/17  9:00 PM  Result Value Ref Range Status   Specimen Description BLOOD LEFT HAND  Final   Special Requests IN PEDIATRIC BOTTLE Blood Culture adequate volume  Final   Culture   Final    NO GROWTH 2 DAYS Performed at [facility], [place] [address] [place], [place] [address]    Report Status PENDING  Incomplete         Radiology Studies: Dg Chest 2 View  Result Date: 04/25/2017 CLINICAL DATA:  Sepsis, fever, nausea, vomiting, and diarrhea all day. Surgery to remove thyroid gland 1 week ago. Nonsmoker. EXAM: CHEST  2 VIEW COMPARISON:  04/13/2017 FINDINGS: Normal heart size and pulmonary vascularity. No focal airspace disease or consolidation in the lungs. No blunting of costophrenic angles. No pneumothorax. Mediastinal contours appear intact. Surgical clips in the base of the neck. IMPRESSION: No active cardiopulmonary disease. Electronically Signed   By: Lucienne Capers M.D.   On: 04/25/2017 21:24        Scheduled Meds: . calcitRIOL  0.5 mcg Oral Daily  . calcium carbonate  3 tablet Oral QID  . levothyroxine  88 mcg Oral QAC breakfast  . magnesium oxide  400 mg Oral BID   Continuous Infusions: . sodium chloride 75 mL/hr at 04/26/17 1607  . calcium gluconate    .  calcium gluconate    . calcium gluconate    . calcium gluconate       LOS: 0 days    Time spent: 30 minutes    DAVID,RACHAL A, MD [facility] Pager [phone]  If 7PM-7AM, please contact night-coverage [email or web address] Password [email or web address] 04/27/2017, 5:03 PM

## 2017-04-28 LAB — BASIC METABOLIC PANEL
ANION GAP: 12 (ref 5–15)
Anion gap: 12 (ref 5–15)
Anion gap: 12 (ref 5–15)
BUN: <5 mg/dL — ABNORMAL LOW (ref 6–20)
CALCIUM: 7.1 mg/dL — AB (ref 8.9–10.3)
CO2: 21 mmol/L — ABNORMAL LOW (ref 22–32)
CO2: 20 mmol/L — ABNORMAL LOW (ref 22–32)
CO2: 22 mmol/L (ref 22–32)
CREATININE: 0.72 mg/dL (ref 0.44–1.00)
CREATININE: 0.79 mg/dL (ref 0.44–1.00)
Calcium: 7.2 mg/dL — ABNORMAL LOW (ref 8.9–10.3)
Calcium: 7.4 mg/dL — ABNORMAL LOW (ref 8.9–10.3)
Chloride: 105 mmol/L (ref 101–111)
Chloride: 106 mmol/L (ref 101–111)
Chloride: 104 mmol/L (ref 101–111)
Creatinine, Ser: 0.8 mg/dL (ref 0.44–1.00)
GFR calc Af Amer: >60 mL/min (ref >60)
GFR calc Af Amer: >60 mL/min (ref >60)
GFR calc non Af Amer: >60 mL/min (ref >60)
GFR calc non Af Amer: >60 mL/min (ref >60)
GLUCOSE: 91 mg/dL (ref 65–99)
GLUCOSE: 91 mg/dL (ref 65–99)
GLUCOSE: 96 mg/dL (ref 65–99)
POTASSIUM: 3.7 mmol/L (ref 3.5–5.1)
POTASSIUM: 3.7 mmol/L (ref 3.5–5.1)
POTASSIUM: 3.6 mmol/L (ref 3.5–5.1)
SODIUM: 138 mmol/L (ref 135–145)
SODIUM: 138 mmol/L (ref 135–145)
Sodium: 138 mmol/L (ref 135–145)

## 2017-04-28 LAB — COMPREHENSIVE METABOLIC PANEL
ALK PHOS: 55 U/L (ref 38–126)
ALT: 16 U/L (ref 14–54)
ANION GAP: 10 (ref 5–15)
AST: 20 U/L (ref 15–41)
Albumin: 3.2 g/dL — ABNORMAL LOW (ref 3.5–5.0)
BUN: <5 mg/dL — ABNORMAL LOW (ref 6–20)
CALCIUM: 7.8 mg/dL — AB (ref 8.9–10.3)
CO2: 25 mmol/L (ref 22–32)
CREATININE: 0.74 mg/dL (ref 0.44–1.00)
Chloride: 104 mmol/L (ref 101–111)
Glucose, Bld: 78 mg/dL (ref 65–99)
Potassium: 3.6 mmol/L (ref 3.5–5.1)
Sodium: 139 mmol/L (ref 135–145)
Total Bilirubin: 0.7 mg/dL (ref 0.3–1.2)
Total Protein: 6.2 g/dL — ABNORMAL LOW (ref 6.5–8.1)

## 2017-04-28 LAB — CALCIUM, IONIZED
CALCIUM, IONIZED, SERUM: 3.8 mg/dL — AB (ref 4.5–5.6)
CALCIUM, IONIZED, SERUM: 4.5 mg/dL (ref 4.5–5.6)
Calcium, Ionized, Serum: 4 mg/dL — ABNORMAL LOW (ref 4.5–5.6)

## 2017-04-28 MED ORDER — CALCIUM GLUCONATE 10 % IV SOLN: 1.0000 g | Freq: Once | INTRAVENOUS | Status: DC

## 2017-04-28 MED ORDER — SODIUM CHLORIDE 0.9 % IV SOLN
3.0000 g | Freq: Once | INTRAVENOUS | Status: AC
  Administered 2017-04-28: 3 g via INTRAVENOUS
  Filled 2017-04-28: qty 30

## 2017-04-28 MED ORDER — SENNA 8.6 MG PO TABS
1.0000 | ORAL_TABLET | Freq: Once | ORAL | Status: AC
  Administered 2017-04-28: 8.6 mg via ORAL
  Filled 2017-04-28: qty 1

## 2017-04-28 MED ORDER — CALCITRIOL 0.25 MCG PO CAPS
0.5000 ug | ORAL_CAPSULE | Freq: Two times a day (BID) | ORAL | Status: DC
  Administered 2017-04-28 – 2017-04-30 (×5): 0.5 ug via ORAL
  Filled 2017-04-28 (×5): qty 2

## 2017-04-28 NOTE — Progress Notes (Signed)
  [facility] Progress Note     Subjective: CC-  N/v has resolved since admission. Denies abdominal pain. Tolerating diet. States that she was still having some tingling in her hand, feet, and face yesterday, but she has not experienced any today. Labs drawn last night and this morning still pending.  Objective: Vital signs in last 24 hours: Temp:  [98.2 F (36.8 C)-98.3 F (36.8 C)] 98.2 F (36.8 C) (02/02 0549) Pulse Rate:  [66-75] 66 (02/02 0549) Resp:  [18] 18 (02/02 0549) BP: (100-104)/(54-59) 100/54 (02/02 0549) SpO2:  [100 %] 100 % (02/02 0549) Last BM Date: 04/25/17  Intake/Output from previous day: 02/01 0701 - 02/02 0700 In: 2340 [P.O.:540; I.V.:1800] Out: -  Intake/Output this shift: No intake/output data recorded.  PE: Gen:  Alert, NAD, pleasant HEENT: EOM's intact, pupils equal and round. Incision cdi without erythema or drainage Card:  RRR, no M/G/R heard Pulm:  CTAB, no W/R/R, effort normal Abd: Soft, NT/ND, +BS, no HSM, no hernia Psych: A&Ox3  MSK: motor function intact BUE/BLE  Lab Results:  Recent Labs    04/26/17 0252 04/27/17 0542  WBC 5.5 4.6  HGB 11.5* 10.7*  HCT 33.5* 32.2*  PLT 246 255   BMET Recent Labs    04/27/17 2227 04/28/17 0531  NA 138 138  K 3.8 3.7  CL 105 105  CO2 22 21*  GLUCOSE 95 91  BUN <5* <5*  CREATININE 0.78 0.80  CALCIUM 8.1* 7.1*   PT/INR Recent Labs    04/25/17 2055  LABPROT 12.9  INR 0.98   CMP     Component Value Date/Time   NA 138 04/28/2017 0531   K 3.7 04/28/2017 0531   CL 105 04/28/2017 0531   CO2 21 (L) 04/28/2017 0531   GLUCOSE 91 04/28/2017 0531   BUN <5 (L) 04/28/2017 0531   CREATININE 0.80 04/28/2017 0531   CALCIUM 7.1 (L) 04/28/2017 0531   PROT 5.5 (L) 04/27/2017 0542   ALBUMIN 3.0 (L) 04/27/2017 0542   AST 19 04/27/2017 0542   ALT 14 04/27/2017 0542   ALKPHOS 54 04/27/2017 0542   BILITOT 0.7 04/27/2017 0542   GFRNONAA >60 04/28/2017 0531   GFRAA >60 04/28/2017  0531   Lipase  No results found for: LIPASE     Studies/Results: No results found.  Anti-infectives: Anti-infectives (From admission, onward)   None       Assessment/Plan Gastroenteritis - resolved  Multinodular thyroid goiter with compressive symptoms S/p total thyroidectomy 1/24 Dr. Harlow Asa - POD 9 - path: nodular hyperplasia, benign parathyroid tissue, no malignancy  ID - none FEN - IVF, soft diet VTE - SCDs Foley - none Follow up - Dr. Harlow Asa  Plan - PTH and ionized calcium pending. Continue calcium replacement (IV calcium gluconate, Rocaltrol BID PRN). Once calcium >7.5mg /dl and patient asymptomatic patient may be discharged home on oral regimen. Plan to f/u with Dr. Harlow Asa and endocrinologist Dr. Cruzita Lederer.   LOS: 1 day    Selena Scott , [facility] 04/28/2017, 7:37 AM Pager: [phone] Consults: [phone] Mon-Fri 7:00 am-4:30 pm Sat-Sun 7:00 am-11:30 am

## 2017-04-28 NOTE — Progress Notes (Signed)
Patient ID: Selena Scott, female   DOB: 12-Jan-1981, 37 y.o.   MRN: [id number]  PROGRESS NOTE    Selena Scott  TMH:[id number] DOB: October 04, 1980 DOA: 04/25/2017 PCP: Levin Bacon, NP  Brief Narrative:   37 year old female initially admitted with a GI illness which has resolved and was also hypocalcemic status post total thyroidectomy surgery last week.   Assessment & Plan:   Principal Problem:   Hypocalcemia-slowly to improve.  Continue oral calcium supplementation along with calcitriol which has been increased today.  Continue calcium gluconate IV as needed.  Neurological tingling symptoms are improving.  Active Problems:   Thyroid goiter-status post total thyroidectomy January 25   Viral gastroenteritis-resolved     DVT prophylaxis: SCDs ambulate  code Status: Full  family Communication: None  disposition Plan: When calcium levels remained stable  Consultants:   General surgery    Subjective: Feeling better she is ready to go home  Objective: Vitals:   04/27/17 0543 04/27/17 1346 04/27/17 2138 04/28/17 0549  BP: 103/65 (!) 100/59 (!) 104/55 (!) 100/54  Pulse: (!) 58 75 68 66  Resp: 18 18 18 18   Temp: 99 F (37.2 C) 98.2 F (36.8 C) 98.3 F (36.8 C) 98.2 F (36.8 C)  TempSrc: Oral Oral    SpO2: 99% 100% 100% 100%  Weight:      Height:        Intake/Output Summary (Last 24 hours) at 04/28/2017 1219 Last data filed at 04/28/2017 1000 Gross per 24 hour  Intake 2640 ml  Output -  Net 2640 ml   Filed Weights   04/25/17 2032 04/25/17 2112 04/26/17 1605  Weight: 66.2 kg (146 lb) 69.4 kg (153 lb) 65.8 kg (145 lb 1 oz)    Examination:  General exam: Appears calm and comfortable  Respiratory system: Clear to auscultation. Respiratory effort normal. Cardiovascular system: S1 & S2 heard, RRR. No JVD, murmurs, rubs, gallops or clicks. No pedal edema. Gastrointestinal system: Abdomen is nondistended, soft and nontender. No organomegaly or masses felt. Normal bowel sounds  heard. Central nervous system: Alert and oriented. No focal neurological deficits. Extremities: Symmetric 5 x 5 power. Skin: No rashes, lesions or ulcers Psychiatry: Judgement and insight appear normal. Mood & affect appropriate.     Data Reviewed: I have personally reviewed following labs and imaging studies  CBC: Recent Labs  Lab 04/25/17 2055 04/26/17 0252 04/27/17 0542  WBC 6.7 5.5 4.6  NEUTROABS 5.8  --  1.9  HGB 13.1 11.5* 10.7*  HCT 38.2 33.5* 32.2*  MCV 82.7 82.5 82.6  PLT 291 246 921   Basic Metabolic Panel: Recent Labs  Lab 04/26/17 1540 04/26/17 2125 04/27/17 0542 04/27/17 1118 04/27/17 1528 04/27/17 1740 04/27/17 2227 04/28/17 0531 04/28/17 0956  NA 139 137 137 137  --  138 138 138 138  K 3.7 3.8 3.6 3.8  --  3.7 3.8 3.7 3.7  CL 108 109 107 107  --  106 105 105 106  CO2 22 18* 23 21*  --  22 22 21* 20*  GLUCOSE 105* 108* 91 99  --  93 95 91 91  BUN 5* <5* <5* <5*  --  <5* <5* <5* <5*  CREATININE 0.79 0.80 0.84 0.75  --  0.71 0.78 0.80 0.72  CALCIUM 5.9* 6.3* 6.2* 7.0* 6.3* 6.8* 8.1* 7.1* 7.2*  MG 1.4* 1.8 1.5*  --  2.1  --   --   --   --    GFR: Estimated Creatinine Clearance: 87 mL/min (by  C-G formula based on SCr of 0.72 mg/dL). Liver Function Tests: Recent Labs  Lab 04/25/17 2055 04/27/17 0542  AST 22 19  ALT 15 14  ALKPHOS 69 54  BILITOT 0.9 0.7  PROT 6.9 5.5*  ALBUMIN 3.8 3.0*   No results for input(s): LIPASE, AMYLASE in the last 168 hours. No results for input(s): AMMONIA in the last 168 hours. Coagulation Profile: Recent Labs  Lab 04/25/17 2055  INR 0.98   Cardiac Enzymes: No results for input(s): CKTOTAL, CKMB, CKMBINDEX, TROPONINI in the last 168 hours. BNP (last 3 results) No results for input(s): PROBNP in the last 8760 hours. HbA1C: No results for input(s): HGBA1C in the last 72 hours. CBG: No results for input(s): GLUCAP in the last 168 hours. Lipid Profile: No results for input(s): CHOL, HDL, LDLCALC, TRIG,  CHOLHDL, LDLDIRECT in the last 72 hours. Thyroid Function Tests: No results for input(s): TSH, T4TOTAL, FREET4, T3FREE, THYROIDAB in the last 72 hours. Anemia Panel: No results for input(s): VITAMINB12, FOLATE, FERRITIN, TIBC, IRON, RETICCTPCT in the last 72 hours. Sepsis Labs: Recent Labs  Lab 04/25/17 2108  LATICACIDVEN 0.97    Recent Results (from the past 240 hour(s))  Blood culture (routine x 2)     Status: None (Preliminary result)   Collection Time: 04/25/17 12:08 AM  Result Value Ref Range Status   Specimen Description BLOOD LEFT ARM  Final   Special Requests   Final    BOTTLES DRAWN AEROBIC AND ANAEROBIC Blood Culture adequate volume   Culture   Final    NO GROWTH 3 DAYS Performed at [facility], [address], [place], [place] [address]    Report Status PENDING  Incomplete  Blood culture (routine x 2)     Status: None (Preliminary result)   Collection Time: 04/25/17  9:00 PM  Result Value Ref Range Status   Specimen Description BLOOD LEFT HAND  Final   Special Requests IN PEDIATRIC BOTTLE Blood Culture adequate volume  Final   Culture   Final    NO GROWTH 3 DAYS Performed at [facility], [place] [address] [place], [place] [address]    Report Status PENDING  Incomplete         Radiology Studies: No results found.      Scheduled Meds: . calcitRIOL  0.5 mcg Oral BID  . calcium carbonate  3 tablet Oral QID  . levothyroxine  88 mcg Oral QAC breakfast  . magnesium oxide  400 mg Oral BID   Continuous Infusions: . sodium chloride 75 mL/hr at 04/28/17 0129     LOS: 1 day    Time spent: 25 minutes    DAVID,RACHAL A, MD [facility] Pager [phone]  If 7PM-7AM, please contact night-coverage [email or web address] Password [email or web address] 04/28/2017, 12:19 PM

## 2017-04-29 LAB — BASIC METABOLIC PANEL
Anion gap: 10 (ref 5–15)
Anion gap: 13 (ref 5–15)
Anion gap: 11 (ref 5–15)
Anion gap: 13 (ref 5–15)
BUN: 6 mg/dL (ref 6–20)
BUN: 6 mg/dL (ref 6–20)
BUN: 6 mg/dL (ref 6–20)
CHLORIDE: 104 mmol/L (ref 101–111)
CHLORIDE: 105 mmol/L (ref 101–111)
CHLORIDE: 105 mmol/L (ref 101–111)
CHLORIDE: 105 mmol/L (ref 101–111)
CO2: 21 mmol/L — ABNORMAL LOW (ref 22–32)
CO2: 23 mmol/L (ref 22–32)
CO2: 20 mmol/L — AB (ref 22–32)
CO2: 21 mmol/L — AB (ref 22–32)
CREATININE: 0.76 mg/dL (ref 0.44–1.00)
CREATININE: 0.77 mg/dL (ref 0.44–1.00)
CREATININE: 0.77 mg/dL (ref 0.44–1.00)
Calcium: 6.8 mg/dL — ABNORMAL LOW (ref 8.9–10.3)
Calcium: 6.9 mg/dL — ABNORMAL LOW (ref 8.9–10.3)
Calcium: 8.1 mg/dL — ABNORMAL LOW (ref 8.9–10.3)
Calcium: 7.7 mg/dL — ABNORMAL LOW (ref 8.9–10.3)
Creatinine, Ser: 0.79 mg/dL (ref 0.44–1.00)
GFR calc Af Amer: >60 mL/min (ref >60)
GFR calc Af Amer: >60 mL/min (ref >60)
GFR calc Af Amer: >60 mL/min (ref >60)
GFR calc Af Amer: >60 mL/min (ref >60)
GFR calc non Af Amer: >60 mL/min (ref >60)
GFR calc non Af Amer: >60 mL/min (ref >60)
GFR calc non Af Amer: >60 mL/min (ref >60)
GFR calc non Af Amer: >60 mL/min (ref >60)
GLUCOSE: 92 mg/dL (ref 65–99)
GLUCOSE: 93 mg/dL (ref 65–99)
GLUCOSE: 91 mg/dL (ref 65–99)
GLUCOSE: 85 mg/dL (ref 65–99)
POTASSIUM: 3.7 mmol/L (ref 3.5–5.1)
Potassium: 3.8 mmol/L (ref 3.5–5.1)
Potassium: 3.9 mmol/L (ref 3.5–5.1)
Potassium: 3.6 mmol/L (ref 3.5–5.1)
SODIUM: 138 mmol/L (ref 135–145)
SODIUM: 138 mmol/L (ref 135–145)
Sodium: 136 mmol/L (ref 135–145)
Sodium: 139 mmol/L (ref 135–145)

## 2017-04-29 LAB — CALCIUM, IONIZED
CALCIUM, IONIZED, SERUM: 4 mg/dL — AB (ref 4.5–5.6)
Calcium, Ionized, Serum: 4 mg/dL — ABNORMAL LOW (ref 4.5–5.6)
Calcium, Ionized, Serum: 4.2 mg/dL — ABNORMAL LOW (ref 4.5–5.6)
Calcium, Ionized, Serum: 4.3 mg/dL — ABNORMAL LOW (ref 4.5–5.6)

## 2017-04-29 LAB — PTH, INTACT AND CALCIUM
CALCIUM TOTAL (PTH): 7.3 mg/dL — AB (ref 8.7–10.2)
PTH: 12 pg/mL — AB (ref 15–65)

## 2017-04-29 MED ORDER — CALCIUM GLUCONATE 10 % IV SOLN: 1.0000 g | Freq: Once | INTRAVENOUS | Status: DC

## 2017-04-29 MED ORDER — DOCUSATE SODIUM 100 MG PO CAPS
100.0000 mg | ORAL_CAPSULE | Freq: Two times a day (BID) | ORAL | Status: DC
  Administered 2017-04-29 – 2017-04-30 (×3): 100 mg via ORAL
  Filled 2017-04-29 (×3): qty 1

## 2017-04-29 MED ORDER — SODIUM CHLORIDE 0.9 % IV SOLN: 1.0000 g | Freq: Once | INTRAVENOUS | Status: DC

## 2017-04-29 MED ORDER — SODIUM CHLORIDE 0.9 % IV SOLN
3.0000 g | Freq: Once | INTRAVENOUS | Status: AC
  Administered 2017-04-29: 3 g via INTRAVENOUS
  Filled 2017-04-29: qty 30

## 2017-04-29 NOTE — Progress Notes (Signed)
  [facility] Progress Note     Subjective: CC-  Anxious to go home. States that she had no paresthesias yesterday.  Received IV calcium gluconate last night, none today. BMP pending this AM. Denies abdominal pain, n/v. Tolerating diet.  Objective: Vital signs in last 24 hours: Temp:  [97.7 F (36.5 C)-98.8 F (37.1 C)] 98.7 F (37.1 C) (02/03 0640) Pulse Rate:  [72-80] 72 (02/03 0640) Resp:  [12-18] 12 (02/03 0640) BP: (99-105)/(51-64) 99/51 (02/03 0640) SpO2:  [100 %] 100 % (02/03 0640) Last BM Date: 04/25/17  Intake/Output from previous day: 02/02 0701 - 02/03 0700 In: 975 [I.V.:975] Out: -  Intake/Output this shift: No intake/output data recorded.  PE: Gen:  Alert, NAD, pleasant HEENT: EOM's intact, pupils equal and round. Incision cdi without erythema or drainage Card:  RRR, no M/G/R heard Pulm:  CTAB, no W/R/R, effort normal Abd: Soft, NT/ND, +BS, no HSM, no hernia Psych: A&Ox3  MSK: motor function intact BUE/BLE  Lab Results:  Recent Labs    04/27/17 0542  WBC 4.6  HGB 10.7*  HCT 32.2*  PLT 255   BMET Recent Labs    04/28/17 1222 04/28/17 1716  NA 139 138  K 3.6 3.6  CL 104 104  CO2 25 22  GLUCOSE 78 96  BUN <5* <5*  CREATININE 0.74 0.79  CALCIUM 7.8* 7.4*   PT/INR No results for input(s): LABPROT, INR in the last 72 hours. CMP     Component Value Date/Time   NA 138 04/28/2017 1716   K 3.6 04/28/2017 1716   CL 104 04/28/2017 1716   CO2 22 04/28/2017 1716   GLUCOSE 96 04/28/2017 1716   BUN <5 (L) 04/28/2017 1716   CREATININE 0.79 04/28/2017 1716   CALCIUM 7.4 (L) 04/28/2017 1716   PROT 6.2 (L) 04/28/2017 1222   ALBUMIN 3.2 (L) 04/28/2017 1222   AST 20 04/28/2017 1222   ALT 16 04/28/2017 1222   ALKPHOS 55 04/28/2017 1222   BILITOT 0.7 04/28/2017 1222   GFRNONAA >60 04/28/2017 1716   GFRAA >60 04/28/2017 1716   Lipase  No results found for: LIPASE     Studies/Results: No results  found.  Anti-infectives: Anti-infectives (From admission, onward)   None       Assessment/Plan Gastroenteritis - resolved  Multinodular thyroid goiter with compressive symptoms S/p total thyroidectomy 1/24 Dr. Harlow Asa - POD 10 - path: nodular hyperplasia, benign parathyroid tissue, no malignancy  ID - none FEN - IVF, soft diet VTE - SCDs Foley - none Follow up - Dr. Harlow Asa  Plan - Ca went from 7.8 yesterday afternoon to 7.4 last night, BMP pending this AM. No paresthesias yesterday. Continue calcium replacement, wean IV as able. Once calcium >7.5mg /dl on oral meds and patient asymptomatic patient may be discharged home on oral regimen. Plan to f/u with Dr. Harlow Asa and endocrinologist Dr. Cruzita Lederer.   LOS: 2 days    Wellington [place] , [facility] 04/29/2017, 7:55 AM Pager: [phone] Consults: [phone] Mon-Fri 7:00 am-4:30 pm Sat-Sun 7:00 am-11:30 am

## 2017-04-29 NOTE — Progress Notes (Signed)
Patient ID: Priya Matsen, female   DOB: 01-21-1981, 37 y.o.   MRN: [id number]  PROGRESS NOTE    Sevanna Ballengee  JSH:[id number] DOB: [birth year] DOA: 04/25/2017 PCP: Levin Bacon, NP   Brief Narrative: 37 year old female initially admitted with a GI illness which has resolved and was also hypocalcemic status post total thyroidectomy surgery last week.     Assessment & Plan:   Principal Problem:   Hypocalcemia-overall slowly improving.  General surgery would like for her calcium level to be above 7.5 with oral supplementation without the need of any IV and for her to be asymptomatic prior to discharge.  Her tingling has markedly improved.  Her calcitriol has been increased along with her oral calcium.  We will give another 3 g of calcium gluconate today.  Her levels are improved and are hanging around 7 at this time.  Active Problems:   Thyroid goiter-status post total thyroidectomy on January 25   Viral gastroenteritis-resolved  [birth year]orrow is patient's birthday   DVT prophylaxis: SCDs and ambulate  code Status: Full  family Communication: None  disposition Plan: When calcium levels remained stable   Consultants:   General surgery   Subjective: Eager to go home no complaints  Objective: Vitals:   04/28/17 0549 04/28/17 1610 04/28/17 2126 04/29/17 0640  BP: (!) 100/54 102/63 105/64 (!) 99/51  Pulse: 66 80 73 72  Resp: 18 18 18 12   Temp: 98.2 F (36.8 C) 97.7 F (36.5 C) 98.8 F (37.1 C) 98.7 F (37.1 C)  TempSrc:  Oral Oral Oral  SpO2: 100% 100% 100% 100%  Weight:      Height:        Intake/Output Summary (Last 24 hours) at 04/29/2017 1140 Last data filed at 04/28/2017 1900 Gross per 24 hour  Intake 675 ml  Output -  Net 675 ml   Filed Weights   04/25/17 2032 04/25/17 2112 04/26/17 1605  Weight: 66.2 kg (146 lb) 69.4 kg (153 lb) 65.8 kg (145 lb 1 oz)    Examination:  General exam: Appears calm and comfortable  Respiratory system: Clear to auscultation.  Respiratory effort normal. Cardiovascular system: S1 & S2 heard, RRR. No JVD, murmurs, rubs, gallops or clicks. No pedal edema. Gastrointestinal system: Abdomen is nondistended, soft and nontender. No organomegaly or masses felt. Normal bowel sounds heard. Central nervous system: Alert and oriented. No focal neurological deficits. Extremities: Symmetric 5 x 5 power. Skin: No rashes, lesions or ulcers Psychiatry: Judgement and insight appear normal. Mood & affect appropriate.     Data Reviewed: I have personally reviewed following labs and imaging studies  CBC: Recent Labs  Lab 04/25/17 2055 04/26/17 0252 04/27/17 0542  WBC 6.7 5.5 4.6  NEUTROABS 5.8  --  1.9  HGB 13.1 11.5* 10.7*  HCT 38.2 33.5* 32.2*  MCV 82.7 82.5 82.6  PLT 291 246 850   Basic Metabolic Panel: Recent Labs  Lab 04/26/17 1540 04/26/17 2125 04/27/17 0542  04/27/17 1528  04/28/17 0531 04/28/17 0956 04/28/17 1222 04/28/17 1716 04/29/17 1010  NA 139 137 137   < >  --    < > 138 138 139 138 138  138  K 3.7 3.8 3.6   < >  --    < > 3.7 3.7 3.6 3.6 3.9  3.8  CL 108 109 107   < >  --    < > 105 106 104 104 105  104  CO2 22 18* 23   < >  --    < >  21* 20* 25 22 23   21*  GLUCOSE 105* 108* 91   < >  --    < > 91 91 78 96 93  92  BUN 5* <5* <5*   < >  --    < > <5* <5* <5* <5* 6  6  CREATININE 0.79 0.80 0.84   < >  --    < > 0.80 0.72 0.74 0.79 0.76  0.77  CALCIUM 5.9* 6.3* 6.2*   < > 6.3*   < > 7.1* 7.2* 7.8* 7.4* 6.9*  6.8*  MG 1.4* 1.8 1.5*  --  2.1  --   --   --   --   --   --    < > = values in this interval not displayed.   GFR: Estimated Creatinine Clearance: 87 mL/min (by C-G formula based on SCr of 0.77 mg/dL). Liver Function Tests: Recent Labs  Lab 04/25/17 2055 04/27/17 0542 04/28/17 1222  AST 22 19 20   ALT 15 14 16   ALKPHOS 69 54 55  BILITOT 0.9 0.7 0.7  PROT 6.9 5.5* 6.2*  ALBUMIN 3.8 3.0* 3.2*   No results for input(s): LIPASE, AMYLASE in the last 168 hours. No results for  input(s): AMMONIA in the last 168 hours. Coagulation Profile: Recent Labs  Lab 04/25/17 2055  INR 0.98   Cardiac Enzymes: No results for input(s): CKTOTAL, CKMB, CKMBINDEX, TROPONINI in the last 168 hours. BNP (last 3 results) No results for input(s): PROBNP in the last 8760 hours. HbA1C: No results for input(s): HGBA1C in the last 72 hours. CBG: No results for input(s): GLUCAP in the last 168 hours. Lipid Profile: No results for input(s): CHOL, HDL, LDLCALC, TRIG, CHOLHDL, LDLDIRECT in the last 72 hours. Thyroid Function Tests: No results for input(s): TSH, T4TOTAL, FREET4, T3FREE, THYROIDAB in the last 72 hours. Anemia Panel: No results for input(s): VITAMINB12, FOLATE, FERRITIN, TIBC, IRON, RETICCTPCT in the last 72 hours. Sepsis Labs: Recent Labs  Lab 04/25/17 2108  LATICACIDVEN 0.97    Recent Results (from the past 240 hour(s))  Blood culture (routine x 2)     Status: None (Preliminary result)   Collection Time: 04/25/17 12:08 AM  Result Value Ref Range Status   Specimen Description BLOOD LEFT ARM  Final   Special Requests   Final    BOTTLES DRAWN AEROBIC AND ANAEROBIC Blood Culture adequate volume   Culture   Final    NO GROWTH 3 DAYS Performed at [facility], [address], [place], [place] [address]    Report Status PENDING  Incomplete  Blood culture (routine x 2)     Status: None (Preliminary result)   Collection Time: 04/25/17  9:00 PM  Result Value Ref Range Status   Specimen Description BLOOD LEFT HAND  Final   Special Requests IN PEDIATRIC BOTTLE Blood Culture adequate volume  Final   Culture   Final    NO GROWTH 3 DAYS Performed at [facility], [place] [address] [place], [place] [address]    Report Status PENDING  Incomplete         Radiology Studies: No results found.      Scheduled Meds: . calcitRIOL  0.5 mcg Oral BID  . calcium carbonate  3 tablet Oral QID  . docusate sodium  100 mg Oral BID  . levothyroxine  88 mcg Oral  QAC breakfast  . magnesium oxide  400 mg Oral BID   Continuous Infusions: . sodium chloride 75  mL/hr at 04/28/17 0129  . calcium gluconate       LOS: 2 days    Time spent: 25 minutes    DAVID,RACHAL A, MD [facility] Pager [phone]  If 7PM-7AM, please contact night-coverage [email or web address] Password [email or web address] 04/29/2017, 11:40 AM

## 2017-04-30 LAB — BASIC METABOLIC PANEL
Anion gap: 9 (ref 5–15)
Anion gap: 12 (ref 5–15)
BUN: 5 mg/dL — ABNORMAL LOW (ref 6–20)
BUN: <5 mg/dL — ABNORMAL LOW (ref 6–20)
CO2: 22 mmol/L (ref 22–32)
CO2: 22 mmol/L (ref 22–32)
Calcium: 7.2 mg/dL — ABNORMAL LOW (ref 8.9–10.3)
Calcium: 8.1 mg/dL — ABNORMAL LOW (ref 8.9–10.3)
Chloride: 108 mmol/L (ref 101–111)
Chloride: 105 mmol/L (ref 101–111)
Creatinine, Ser: 0.71 mg/dL (ref 0.44–1.00)
Creatinine, Ser: 0.73 mg/dL (ref 0.44–1.00)
GFR calc Af Amer: >60 mL/min (ref >60)
GFR calc Af Amer: >60 mL/min (ref >60)
GFR calc non Af Amer: >60 mL/min (ref >60)
GFR calc non Af Amer: >60 mL/min (ref >60)
Glucose, Bld: 94 mg/dL (ref 65–99)
Glucose, Bld: 87 mg/dL (ref 65–99)
Potassium: 3.5 mmol/L (ref 3.5–5.1)
Potassium: 3.8 mmol/L (ref 3.5–5.1)
Sodium: 139 mmol/L (ref 135–145)
Sodium: 139 mmol/L (ref 135–145)

## 2017-04-30 LAB — CULTURE, BLOOD (ROUTINE X 2)
CULTURE: NO GROWTH
Culture: NO GROWTH
Special Requests: ADEQUATE
Special Requests: ADEQUATE

## 2017-04-30 LAB — CALCIUM, IONIZED: CALCIUM, IONIZED, SERUM: 3.8 mg/dL — AB (ref 4.5–5.6)

## 2017-04-30 MED ORDER — CALCITRIOL 0.25 MCG PO CAPS: 0.5000 ug | ORAL_CAPSULE | Freq: Two times a day (BID) | ORAL | 0 refills | Status: AC

## 2017-04-30 MED ORDER — ONDANSETRON HCL 4 MG PO TABS: 4.0000 mg | ORAL_TABLET | Freq: Four times a day (QID) | ORAL | 0 refills | Status: DC | PRN

## 2017-04-30 MED ORDER — MAGNESIUM OXIDE 400 (241.3 MG) MG PO TABS: 400.0000 mg | ORAL_TABLET | Freq: Two times a day (BID) | ORAL | 0 refills | Status: AC

## 2017-04-30 MED ORDER — SODIUM CHLORIDE 0.9 % IV SOLN
3.0000 g | Freq: Once | INTRAVENOUS | Status: AC
  Administered 2017-04-30: 3 g via INTRAVENOUS
  Filled 2017-04-30: qty 30

## 2017-04-30 MED ORDER — CALCIUM CARBONATE ANTACID 500 MG PO CHEW: 4.0000 | CHEWABLE_TABLET | Freq: Four times a day (QID) | ORAL | 0 refills | Status: AC

## 2017-04-30 NOTE — Progress Notes (Signed)
Nsg Discharge Note  Admit Date:  04/25/2017 Discharge date: 04/30/2017   Selena Scott to be D/C'd Home per MD order.  AVS completed.  Copy for chart, and copy for patient signed, and dated. Patient/caregiver able to verbalize understanding.  Discharge Medication: Allergies as of 04/30/2017   No Known Allergies     Medication List    TAKE these medications   calcitRIOL 0.25 MCG capsule Commonly known as:  ROCALTROL Take 2 capsules (0.5 mcg total) by mouth 2 (two) times daily. What changed:    how much to take  when to take this   calcium carbonate 500 MG chewable tablet Commonly known as:  TUMS Chew 4 tablets (800 mg of elemental calcium total) by mouth 4 (four) times daily for 15 days. What changed:  how much to take   fluticasone 50 MCG/ACT nasal spray Commonly known as:  FLONASE Place 1 spray into both nostrils daily as needed for allergies or rhinitis.   levothyroxine 88 MCG tablet Commonly known as:  SYNTHROID Take 1 tablet (88 mcg total) by mouth daily before breakfast.   magnesium oxide 400 (241.3 Mg) MG tablet Commonly known as:  MAG-OX Take 1 tablet (400 mg total) by mouth 2 (two) times daily for 10 days.   ondansetron 4 MG tablet Commonly known as:  ZOFRAN Take 1 tablet (4 mg total) by mouth every 6 (six) hours as needed for nausea.   traMADol 50 MG tablet Commonly known as:  ULTRAM Take 1-2 tablets (50-100 mg total) by mouth every 6 (six) hours as needed for moderate pain.       Discharge Assessment: Vitals:   04/29/17 2200 04/30/17 0506  BP: 115/70 (!) 95/54  Pulse: 72 72  Resp: 16 16  Temp: 98.4 F (36.9 C) 98.3 F (36.8 C)  SpO2: 99% 100%   Skin clean, dry and intact without evidence of skin break down, no evidence of skin tears noted. IV catheter discontinued intact. Site without signs and symptoms of complications - no redness or edema noted at insertion site, patient denies c/o pain - only slight tenderness at site.  Dressing with slight  pressure applied.  D/c Instructions-Education: Discharge instructions given to patient/family with verbalized understanding. D/c education completed with patient/family including follow up instructions, medication list, d/c activities limitations if indicated, with other d/c instructions as indicated by MD - patient able to verbalize understanding, all questions fully answered. Patient instructed to return to ED, call 911, or call MD for any changes in condition.  Patient escorted via [place], and D/C home via private auto.  Roverto Bodmer Margaretha Sheffield, RN 04/30/2017 11:57 AM

## 2017-04-30 NOTE — Discharge Summary (Signed)
Physician Discharge Summary  Selena Scott MWU:[id number] DOB: 1980/11/05 DOA: 04/25/2017  PCP: Levin Bacon, NP  Admit date: 04/25/2017 Discharge date: 04/30/2017  Admitted From: Home Disposition: Home  Recommendations for Outpatient Follow-up:  1. Follow up with PCP in 1 week with repeat BMP and magnesium 2. Follow-up with general surgery/Dr. Harlow Asa in 1 week 3. Follow-up with endocrinology/Dr. Cruzita Lederer in 1-2 weeks   Home Health: No  equipment/Devices: None  Discharge Condition: Stable CODE STATUS: Full Diet recommendation: Regular   Brief/Interim Summary: 37 year old female with history of recent total thyroidectomy for multinodular goiter presented with nausea, vomiting and diarrhea.  She was found to be hypocalcemic with calcium of 6.6.  She was admitted and IV calcium replacement was started.  Nausea and vomiting was symptomatically treated.  Her symptoms improved.  Her calcium remained low subsequently for which she had treatment with calcium gluconate intravenously.  General surgery was also consulted.  Her oral regimen was also changed.  Her symptoms have improved.  General surgery has cleared the patient for discharge.  Discharge Diagnoses:  Principal Problem:   Hypocalcemia Active Problems:   Thyroid goiter   Viral gastroenteritis  Hypocalcemia -With history of recent total thyroidectomy for multinodular goiter -Treated with intravenous calcium gluconate and change in  her oral regimen as well.  Today the calcium level is 7.2.  This will be replaced with intravenous calcium gluconate. -General surgery following. -Continue Rocaltrol 0.5 mcg twice a day and Tums 4 tablets 4 times a day -Her symptoms of tingling and numbness have resolved.  Diet is improved. -General surgery has cleared the patient for discharge with outpatient follow-up with primary care provider and/or general surgery repeat labs.  If symptoms return, patient will need to have stat blood work.  Probable  viral gastroenteritis causing nausea and vomiting -Treated with IV fluids.  Resolved.  Tolerating diet  History of multinodular thyroid goiter status post recent total thyroidectomy on April 20, 2017 -Outpatient follow-up with general surgery and Endocrinology -Continue with levothyroxine  Hypomagnesemia -Continue oral magnesium oxide -Outpatient follow-up  Discharge Instructions  Discharge Instructions    Call MD for:  difficulty breathing, headache or visual disturbances   Complete by:  As directed    Call MD for:  extreme fatigue   Complete by:  As directed    Call MD for:  hives   Complete by:  As directed    Call MD for:  persistant dizziness or light-headedness   Complete by:  As directed    Call MD for:  persistant nausea and vomiting   Complete by:  As directed    Call MD for:  redness, tenderness, or signs of infection (pain, swelling, redness, odor or green/yellow discharge around incision site)   Complete by:  As directed    Call MD for:  severe uncontrolled pain   Complete by:  As directed    Call MD for:  temperature >100.4   Complete by:  As directed    Diet general   Complete by:  As directed    Increase activity slowly   Complete by:  As directed      Allergies as of 04/30/2017   No Known Allergies     Medication List    TAKE these medications   calcitRIOL 0.25 MCG capsule Commonly known as:  ROCALTROL Take 2 capsules (0.5 mcg total) by mouth 2 (two) times daily. What changed:    how much to take  when to take this   calcium carbonate 500  MG chewable tablet Commonly known as:  TUMS Chew 4 tablets (800 mg of elemental calcium total) by mouth 4 (four) times daily for 15 days. What changed:  how much to take   fluticasone 50 MCG/ACT nasal spray Commonly known as:  FLONASE Place 1 spray into both nostrils daily as needed for allergies or rhinitis.   levothyroxine 88 MCG tablet Commonly known as:  SYNTHROID Take 1 tablet (88 mcg total) by  mouth daily before breakfast.   magnesium oxide 400 (241.3 Mg) MG tablet Commonly known as:  MAG-OX Take 1 tablet (400 mg total) by mouth 2 (two) times daily for 10 days.   ondansetron 4 MG tablet Commonly known as:  ZOFRAN Take 1 tablet (4 mg total) by mouth every 6 (six) hours as needed for nausea.   traMADol 50 MG tablet Commonly known as:  ULTRAM Take 1-2 tablets (50-100 mg total) by mouth every 6 (six) hours as needed for moderate pain.      Follow-up Information    Levin Bacon, NP. Schedule an appointment as soon as possible for a visit in 1 week(s).   Specialty:  Family Medicine Why:  With repeat BMP with magnesium Contact information: [address] [place] [address] [phone]        Armandina Gemma, MD. Schedule an appointment as soon as possible for a visit in 1 week(s).   Specialty:  General Surgery Contact information: [place] [address] [phone]          No Known Allergies  Consultations:  General surgery   Procedures/Studies: Dg Chest 2 View  Result Date: 04/25/2017 CLINICAL DATA:  Sepsis, fever, nausea, vomiting, and diarrhea all day. Surgery to remove thyroid gland 1 week ago. Nonsmoker. EXAM: CHEST  2 VIEW COMPARISON:  04/13/2017 FINDINGS: Normal heart size and pulmonary vascularity. No focal airspace disease or consolidation in the lungs. No blunting of costophrenic angles. No pneumothorax. Mediastinal contours appear intact. Surgical clips in the base of the neck. IMPRESSION: No active cardiopulmonary disease. Electronically Signed   By: Lucienne Capers M.D.   On: 04/25/2017 21:24   Dg Chest 2 View  Result Date: 04/13/2017 CLINICAL DATA:  Pre Op - Complete Thyroidectomy; no chest complaints. Non-smoker EXAM: CHEST  2 VIEW COMPARISON:  None. FINDINGS: The heart size and mediastinal contours are within normal limits. Both lungs are clear. No pleural effusion or pneumothorax. The visualized skeletal  structures are unremarkable. IMPRESSION: Normal chest radiographs. Electronically Signed   By: Lajean Manes M.D.   On: 04/13/2017 14:07      Subjective: Patient seen and examined at bedside.  She denies any overnight fever, nausea, vomiting, tingling or numbness.  Discharge Exam: Vitals:   04/29/17 2200 04/30/17 0506  BP: 115/70 (!) 95/54  Pulse: 72 72  Resp: 16 16  Temp: 98.4 F (36.9 C) 98.3 F (36.8 C)  SpO2: 99% 100%   Vitals:   04/29/17 0640 04/29/17 1347 04/29/17 2200 04/30/17 0506  BP: (!) 99/51 105/63 115/70 (!) 95/54  Pulse: 72 68 72 72  Resp: 12 16 16 16   Temp: 98.7 F (37.1 C) 99.3 F (37.4 C) 98.4 F (36.9 C) 98.3 F (36.8 C)  TempSrc: Oral Oral Oral Oral  SpO2: 100% 99% 99% 100%  Weight:      Height:        General: Pt is alert, awake, not in acute distress Cardiovascular: Rate controlled, S1/S2 + Respiratory: Bilateral decreased breath sounds at bases  abdominal: Soft, NT, ND, bowel sounds + Extremities: no edema, no cyanosis    The results of significant diagnostics from this hospitalization (including imaging, microbiology, ancillary and laboratory) are listed below for reference.     Microbiology: Recent Results (from the past 240 hour(s))  Blood culture (routine x 2)     Status: None (Preliminary result)   Collection Time: 04/25/17 12:08 AM  Result Value Ref Range Status   Specimen Description BLOOD LEFT ARM  Final   Special Requests   Final    BOTTLES DRAWN AEROBIC AND ANAEROBIC Blood Culture adequate volume   Culture   Final    NO GROWTH 4 DAYS Performed at [facility], [address] [place], [place] [address]    Report Status PENDING  Incomplete  Blood culture (routine x 2)     Status: None (Preliminary result)   Collection Time: 04/25/17  9:00 PM  Result Value Ref Range Status   Specimen Description BLOOD LEFT HAND  Final   Special Requests IN PEDIATRIC BOTTLE Blood Culture adequate volume  Final   Culture   Final    NO  GROWTH 4 DAYS Performed at [facility], [place] [address] [place], [place] [address]    Report Status PENDING  Incomplete     Labs: BNP (last 3 results) No results for input(s): BNP in the last 8760 hours. Basic Metabolic Panel: Recent Labs  Lab 04/26/17 1540 04/26/17 2125 04/27/17 0542  04/27/17 1528  04/28/17 1716 04/29/17 1010 04/29/17 1430 04/29/17 2054 04/30/17 0304  NA 139 137 137   < >  --    < > 138 138  138 136 139 139  K 3.7 3.8 3.6   < >  --    < > 3.6 3.9  3.8 3.6 3.7 3.5  CL 108 109 107   < >  --    < > 104 105  104 105 105 108  CO2 22 18* 23   < >  --    < > 22 23  21* 20* 21* 22  GLUCOSE 105* 108* 91   < >  --    < > 96 93  92 91 85 94  BUN 5* <5* <5*   < >  --    < > <5* 6  6 <5* 6 5*  CREATININE 0.79 0.80 0.84   < >  --    < > 0.79 0.76  0.77 0.77 0.79 0.71  CALCIUM 5.9* 6.3* 6.2*   < > 6.3*   < > 7.4* 6.9*  6.8* 8.1* 7.7* 7.2*  MG 1.4* 1.8 1.5*  --  2.1  --   --   --   --   --   --    < > = values in this interval not displayed.   Liver Function Tests: Recent Labs  Lab 04/25/17 2055 04/27/17 0542 04/28/17 1222  AST 22 19 20   ALT 15 14 16   ALKPHOS 69 54 55  BILITOT 0.9 0.7 0.7  PROT 6.9 5.5* 6.2*  ALBUMIN 3.8 3.0* 3.2*   No results for input(s): LIPASE, AMYLASE in the last 168 hours. No results for input(s): AMMONIA in the last 168 hours. CBC: Recent Labs  Lab 04/25/17 2055 04/26/17 0252 04/27/17 0542  WBC 6.7 5.5 4.6  NEUTROABS 5.8  --  1.9  HGB 13.1 11.5* 10.7*  HCT 38.2 33.5* 32.2*  MCV 82.7 82.5 82.6  PLT 291 246 255   Cardiac  Enzymes: No results for input(s): CKTOTAL, CKMB, CKMBINDEX, TROPONINI in the last 168 hours. BNP: Invalid input(s): POCBNP CBG: No results for input(s): GLUCAP in the last 168 hours. D-Dimer No results for input(s): DDIMER in the last 72 hours. Hgb A1c No results for input(s): HGBA1C in the last 72 hours. Lipid Profile No results for input(s): CHOL, HDL, LDLCALC, TRIG, CHOLHDL, LDLDIRECT in  the last 72 hours. Thyroid function studies No results for input(s): TSH, T4TOTAL, T3FREE, THYROIDAB in the last 72 hours.  Invalid input(s): FREET3 Anemia work up No results for input(s): VITAMINB12, FOLATE, FERRITIN, TIBC, IRON, RETICCTPCT in the last 72 hours. Urinalysis    Component Value Date/Time   COLORURINE YELLOW 04/25/2017 2033   APPEARANCEUR HAZY (A) 04/25/2017 2033   LABSPEC 1.028 04/25/2017 2033   PHURINE 5.0 04/25/2017 2033   GLUCOSEU NEGATIVE 04/25/2017 2033   HGBUR NEGATIVE 04/25/2017 2033   BILIRUBINUR NEGATIVE 04/25/2017 2033   KETONESUR 5 (A) 04/25/2017 2033   PROTEINUR NEGATIVE 04/25/2017 2033   NITRITE NEGATIVE 04/25/2017 2033   LEUKOCYTESUR NEGATIVE 04/25/2017 2033   Sepsis Labs Invalid input(s): PROCALCITONIN,  WBC,  LACTICIDVEN Microbiology Recent Results (from the past 240 hour(s))  Blood culture (routine x 2)     Status: None (Preliminary result)   Collection Time: 04/25/17 12:08 AM  Result Value Ref Range Status   Specimen Description BLOOD LEFT ARM  Final   Special Requests   Final    BOTTLES DRAWN AEROBIC AND ANAEROBIC Blood Culture adequate volume   Culture   Final    NO GROWTH 4 DAYS Performed at [facility], [place] [address] [place], [place] [address]    Report Status PENDING  Incomplete  Blood culture (routine x 2)     Status: None (Preliminary result)   Collection Time: 04/25/17  9:00 PM  Result Value Ref Range Status   Specimen Description BLOOD LEFT HAND  Final   Special Requests IN PEDIATRIC BOTTLE Blood Culture adequate volume  Final   Culture   Final    NO GROWTH 4 DAYS Performed at [facility], [place] [address] [place], [place] [address]    Report Status PENDING  Incomplete     Time coordinating discharge: 35 minutes  SIGNED:   Aline August, MD  [facility] 04/30/2017, 9:20 AM Pager: [phone]  If 7PM-7AM, please contact night-coverage [email or web address] Password [email or web address]

## 2017-04-30 NOTE — Progress Notes (Signed)
General Surgery [facility]  Assessment & Plan: POD#11 - status post total thyroidectomy for multinodular goiter with compressive symptoms             Profound hypocalcemia developing 2-3 days after discharge from thyroidectomy             On oral calcium and Vit D supplements - unable to maintain due to viral gastroenteritis             GI symptoms now resolved  Calcium levels improved but still requiring IV supplementation.  Symptoms of paresthesias and spasms resolved.  Recommended oral regimen for home would be TUMS 4 tablets four times a day and Rocaltrol 0.5 mcg twice daily.  If asymptomatic, would consider discharge home on above regimen if calcium stable above 7.0 mg/dl.  Discussed with patient this morning.  If recurrent symptoms, wound need stat labs and possible readmission.  She understands.  Voice is normal.  Beginning to apply topical creams to incision.  No dysphagia or dyspnea.  Today is her birthday!        Earnstine Regal, MD, [facility]       Office: [phone]    Chief Complaint: Hypocalcemia status post thyroidectomy  Subjective: Patient seated in bed.  Comfortable.with no symptoms of hypocalcemia this AM.  Last IV calcium yesterday morning.  Objective: Vital signs in last 24 hours: Temp:  [98.3 F (36.8 C)-99.3 F (37.4 C)] 98.3 F (36.8 C) (02/04 0506) Pulse Rate:  [68-72] 72 (02/04 0506) Resp:  [16] 16 (02/04 0506) BP: (95-115)/(54-70) 95/54 (02/04 0506) SpO2:  [99 %-100 %] 100 % (02/04 0506) Last BM Date: 04/29/17  Intake/Output from previous day: 02/03 0701 - 02/04 0700 In: 1575 [I.V.:1575] Out: -  Intake/Output this shift: No intake/output data recorded.  Physical Exam: HEENT - sclerae clear, mucous membranes moist Neck - wound dry and intact; minimal STS; voice normal Chest - clear bilaterally Cor - RRR Ext - no edema, non-tender Neuro - alert & oriented, no focal deficits  Lab  Results:  No results for input(s): WBC, HGB, HCT, PLT in the last 72 hours. BMET Recent Labs    04/29/17 2054 04/30/17 0304  NA 139 139  K 3.7 3.5  CL 105 108  CO2 21* 22  GLUCOSE 85 94  BUN 6 5*  CREATININE 0.79 0.71  CALCIUM 7.7* 7.2*   PT/INR No results for input(s): LABPROT, INR in the last 72 hours. Comprehensive Metabolic Panel:    Component Value Date/Time   NA 139 04/30/2017 0304   NA 139 04/29/2017 2054   K 3.5 04/30/2017 0304   K 3.7 04/29/2017 2054   CL 108 04/30/2017 0304   CL 105 04/29/2017 2054   CO2 22 04/30/2017 0304   CO2 21 (L) 04/29/2017 2054   BUN 5 (L) 04/30/2017 0304   BUN 6 04/29/2017 2054   CREATININE 0.71 04/30/2017 0304   CREATININE 0.79 04/29/2017 2054   GLUCOSE 94 04/30/2017 0304   GLUCOSE 85 04/29/2017 2054   CALCIUM 7.2 (L) 04/30/2017 0304   CALCIUM 7.7 (L) 04/29/2017 2054   CALCIUM 7.3 (L) 04/28/2017 0956   AST 20 04/28/2017 1222   AST 19 04/27/2017 0542   ALT 16 04/28/2017 1222   ALT 14 04/27/2017 0542   ALKPHOS 55 04/28/2017 1222   ALKPHOS 54 04/27/2017 0542   BILITOT 0.7 04/28/2017 1222   BILITOT 0.7 04/27/2017 0542   PROT 6.2 (  L) 04/28/2017 1222   PROT 5.5 (L) 04/27/2017 0542   ALBUMIN 3.2 (L) 04/28/2017 1222   ALBUMIN 3.0 (L) 04/27/2017 0542    Studies/Results: No results found.    Desha Bitner M 04/30/2017  Patient ID: Selena Scott, female   DOB: 1981/03/22, 37 y.o.   MRN: [id number]

## 2017-05-01 LAB — CALCIUM, IONIZED
CALCIUM, IONIZED, SERUM: 4.2 mg/dL — AB (ref 4.5–5.6)
CALCIUM, IONIZED, SERUM: 4 mg/dL — AB (ref 4.5–5.6)
CALCIUM, IONIZED, SERUM: 4.5 mg/dL (ref 4.5–5.6)

## 2017-06-15 ENCOUNTER — Ambulatory Visit: Payer: Managed Care, Other (non HMO) | Admitting: Internal Medicine

## 2017-06-15 DIAGNOSIS — E890 Postprocedural hypothyroidism: Secondary | ICD-10-CM | POA: Insufficient documentation

## 2017-06-15 NOTE — Progress Notes (Deleted)
Patient ID: Selena Scott, female   DOB: [birth year], 37 y.o.   MRN: [id number]    HPI  Selena Scott is a 37 y.o.-year-old female, initially referred by her PCP, Levin Bacon, NP now returning for f/u for Nontoxic multinodular goiter. Last visit 3 months ago.  Reviewed history: Patient started to feel neck enlargement and pressure at the beginning of her pregnancy.  This persisted even after the birth of her baby in 12/2015.  At last visit, she was telling me that she was gagging on saliva, coughing, and snoring.  She saw her PCP and an ultrasound was obtained and showed several nodules.   As she was still complaining of significant neck compression symptoms at last visit, I referred her for surgery and she had total thyroidectomy 2 months ago.  Her neck compression symptoms have all resolved, but unfortunately, she developed postsurgical hypocalcemia.  Reviewed and addended previous  study reports: Thyroid ultrasound (10/04/2016): Thyroid was markedly heterogenous, with several nodules: Right lobe: 1. Right mid nodule 2.1 x 2.0 x 1.2 cm, solid, isoechoic 2. Right mid nodule 1.9 x 1.4 x 1.2 cm, solid, isoechoic 3. Right inferior nodule 2.4 x 2.3 x 2.0 cm, solid, isoechoic Left lobe: 1. Left mid nodule 2.4 x 1.7 x 2.1 cm, solid, hypoechoic-indication for biopsy 2. Left superior nodule 5.1 x 3.2 x 4.2 cm, solid, isoechoic-indication for biopsy 3. Left inferior nodule 1.8 x 1.5 x 1.4 cm, solid, isoechoic  12/28/2016: FNA of the dominant nodules: - Left mid nodule >> benign.  - Left upper nodule >> inconclusive due to insufficient sample  04/19/2017: Total thyroidectomy: Thyroid, thyroidectomy, total - NODULAR HYPERPLASIA - BENIGN PARATHYROID TISSUE - NO MALIGNANCY IDENTIFIED  I reviewed pt's thyroid tests -normal: But this was before her surgery: Lab Results  Component Value Date   TSH 0.67 12/06/2016   FREET4 0.75 12/06/2016  - per Dr. Cordelia Pen note: TSH 0.44  Thyroid antibodies were  negative: Thyroperoxidase Ab SerPl-aCnc 12/06/2016 <1  <9 IU/mL  Thyroglobulin Ab 12/06/2016 <1  < or = 1 IU/mL   Pt denies: - feeling nodules in neck - hoarseness - dysphagia - choking - SOB with lying down  No FH of thyroid ds. No FH of thyroid cancer. No h/o radiation tx to head or neck.  No seaweed or kelp. No recent contrast studies. No herbal supplements. No Biotin use. No recent steroids use.   ROS: Constitutional: no weight gain/no weight loss, no fatigue, no subjective hyperthermia, no subjective hypothermia Eyes: no blurry vision, no xerophthalmia ENT: no sore throat, + see HPI Cardiovascular: no CP/no SOB/no palpitations/no leg swelling Respiratory: no cough/no SOB/no wheezing Gastrointestinal: no N/no V/no D/no C/no acid reflux Musculoskeletal: no muscle aches/no joint aches Skin: no rashes, no hair loss Neurological: no tremors/no numbness/no tingling/no dizziness  I reviewed pt's medications, allergies, PMH, social hx, family hx, and changes were documented in the history of present illness. Otherwise, unchanged from my initial visit note.  Past Medical History:  Diagnosis Date  . Anemia   . Fibroid   . GERD (gastroesophageal reflux disease)    hx of   . HPV (human papilloma virus) anogenital infection   . Medical history non-contributory   . Thyroid disease    Past Surgical History:  Procedure Laterality Date  . NO PAST SURGERIES    . THYROIDECTOMY N/A 04/19/2017   Procedure: TOTAL THYROIDECTOMY;  Surgeon: Armandina Gemma, MD;  Location: [facility];  Service: General;  Laterality: N/A;   Social History  Social History  . Marital status: Married    Spouse name: N/A  . Number of children: 3   Occupational History  . HR supervisor   Social History Main Topics  . Smoking status: Never Smoker  . Smokeless tobacco: Never Used  . Alcohol use No  . Drug use: No   Current Outpatient Medications on File Prior to Visit  Medication Sig Dispense Refill  .  fluticasone (FLONASE) 50 MCG/ACT nasal spray Place 1 spray into both nostrils daily as needed for allergies or rhinitis.    Marland Kitchen levothyroxine (SYNTHROID) 88 MCG tablet Take 1 tablet (88 mcg total) by mouth daily before breakfast. 30 tablet 3  . ondansetron (ZOFRAN) 4 MG tablet Take 1 tablet (4 mg total) by mouth every 6 (six) hours as needed for nausea. 20 tablet 0  . traMADol (ULTRAM) 50 MG tablet Take 1-2 tablets (50-100 mg total) by mouth every 6 (six) hours as needed for moderate pain. 20 tablet 0   No current facility-administered medications on file prior to visit.    No Known Allergies Family History  Problem Relation Age of Onset  . Diabetes type II Mother   . Breast cancer Mother   . Hypertension Mother   . Thyroid disease Neg Hx     PE: There were no vitals taken for this visit. Wt Readings from Last 3 Encounters:  04/26/17 145 lb 1 oz (65.8 kg)  04/19/17 146 lb (66.2 kg)  04/13/17 146 lb (66.2 kg)   Constitutional: overweight, in NAD Eyes: PERRLA, EOMI, no exophthalmos ENT: moist mucous membranes, thyroidectomy scar healing, no cervical lymphadenopathy Cardiovascular: RRR, No MRG Respiratory: CTA B Gastrointestinal: abdomen soft, NT, ND, BS+ Musculoskeletal: no deformities, strength intact in all 4 Skin: moist, warm, no rashes Neurological: no tremor with outstretched hands, DTR normal in all 4  ASSESSMENT: 1. Postsurgical hypothyroidism  2. H/o MNG   Adequacy Reason Satisfactory For Evaluation. Diagnosis THYROID, FINE NEEDLE ASPIRATION, LEFT LOBE, LMP (SPECIMEN 1 OF 2 COLLECTED 12-28-2016) CONSISTENT WITH BENIGN FOLLICULAR NODULE (BETHESDA CATEGORY II). Enid Cutter MD Pathologist, Electronic Signature (Case signed 12/29/2016) Specimen Clinical Information Previous Biopsy: Today. Nodule 4 Left Mid 2.4 cm; Other 2 dimensions: 1.7 z 2.1 cm, Solid /almost completely solid, Hypoechoic, ACR TI-RADS total points: 4, Moderately suspicious nodule Source Thyroid,  Fine Needle Aspiration, Lt Lobe LMP, (Specimen 1 of 2, collected on 12/28/16 )  Adequacy Reason Satisfactory But Limited For Evaluation, Scant Cellularity. Diagnosis THYROID, FINE NEEDLE ASPIRATION, LEFT LOBE LUP (SPECIMEN 2 OF 2 COLLECTED [id number]) SCANT FOLLICULAR EPITHELIUM PRESENT (BETHESDA CATEGORY I). Enid Cutter MD Pathologist, Electronic Signature (Case signed 12/29/2016) Specimen Clinical Information Previous Biopsy: Today, Nodule 5 Left Superior 5.1 cm; Other 2 dimensions: 3.2 x 4.2cm, Solid / almost completely solid, Isoechoic, ACR TI-RADS total points: 3, Mildly suspicious nodule Source Thyroid, Fine Needle Aspiration, Lt Lobe LUP, (Specimen 2 of 2,collected on 12/28/16 )  3. Postsurgical hypocalcemia  PLAN: 1. Postsurgical hypothyroidism - she was started on LT4 88 mcg daily after her total thyroidectomy 2 mo ago - pt feels good on this dose. - we discussed about taking the thyroid hormone every day, with water, >30 minutes before breakfast, separated by >4 hours from acid reflux medications, calcium, iron, multivitamins. Pt. is taking it correctly. - will check thyroid tests today: TSH and fT4 - If labs are abnormal, she will need to return for repeat TFTs in 1.5 months - OTW, RTC in 4 mo  2. H/o MNG - Pt has a  h/o several thyroid nodules, of which 2 were concerning and recommended for biopsy.  1 of the biopsies was benign, however, the largest nodule of 5.1 cm had only an inconclusive biopsy due to lack of enough sample.  We discussed that usually we recommend a repeat biopsy for inconclusive nodules.  She was having significant neck compression sxs and I referred her to surgery. She had total thyroidectomy 04/19/2017 by Dr. Harlow Asa. No neck compression sxs anymore ***. - Her incision is healing well.  - She developed postsurgical hypocalcemia as a complication after the sx  3. Postsurgical hypocalcemia - Her calcium started to decrease after the surgery and she was  actually admitted a few days after her surgery with a low calcium, of 6.6.  She was given IV calcium gluconate in the hospital and sent home on a high dose of calcium carbonate (4 tablets 4 times a day and also calcitriol 0.5 mcg twice a day).  At discharge, her calcium increased to 7.2. -She had an  ionizedcalcium level on 04/28/2017 that was low normal, at 4.5 (4.5-5.6), but PTH was suppressed. -At this visit, we will recheck her calcium, PTH, vitamin D, magnesium, calcitriol, phosphorus  Philemon Kingdom, MD PhD [facility] Endocrinology

## 2017-06-20 ENCOUNTER — Encounter: Payer: Self-pay | Admitting: Internal Medicine

## 2017-06-20 ENCOUNTER — Ambulatory Visit (INDEPENDENT_AMBULATORY_CARE_PROVIDER_SITE_OTHER): Payer: BLUE CROSS/BLUE SHIELD | Admitting: Internal Medicine

## 2017-06-20 VITALS — BP 108/62 | HR 82 | Ht 63.0 in | Wt 153.8 lb

## 2017-06-20 DIAGNOSIS — Z8639 Personal history of other endocrine, nutritional and metabolic disease: Secondary | ICD-10-CM

## 2017-06-20 DIAGNOSIS — E890 Postprocedural hypothyroidism: Secondary | ICD-10-CM | POA: Diagnosis not present

## 2017-06-20 MED ORDER — LEVOTHYROXINE SODIUM 88 MCG PO TABS: 88.0000 ug | ORAL_TABLET | Freq: Every day | ORAL | 3 refills | Status: DC

## 2017-06-20 MED ORDER — CALCITRIOL 0.25 MCG PO CAPS: 0.2500 ug | ORAL_CAPSULE | Freq: Two times a day (BID) | ORAL | 5 refills | Status: DC

## 2017-06-20 NOTE — Patient Instructions (Addendum)
Please continue: - Calcitriol 0.25 mcg 2x a day  Please restart: - Tums 500 mg with lunch, 500 with dinner and 500 at bedtime  Please continue Levothyroxine 88 mcg daily.  Take the thyroid hormone every day, with water, at least 30 minutes before breakfast, separated by at least 4 hours from: - acid reflux medications - calcium - iron - multivitamins  Let's have you back for labs in 1 week.  Please return in 3 months.

## 2017-06-20 NOTE — Progress Notes (Addendum)
Patient ID: Selena Scott, female   DOB: [birth year], 37 y.o.   MRN: [id number]    HPI  Selena Scott is a 37 y.o.-year-old female, initially referred by her PCP, Levin Bacon, NP now returning for f/u for Nontoxic multinodular goiter, postsurgical hypocalcemia. Last visit  3 months ago.  Reviewed history: Patient started to feel neck enlargement and pressure at the beginning of her pregnancy.  This persisted even after the birth of her baby in 12/2015.  At last visit, she was telling me that she was gagging on saliva, coughing, and snoring.  She saw her PCP and an ultrasound was obtained and showed several nodules.   As she was still complaining of significant neck compression symptoms at last visit, I referred her to surgery and she had total thyroidectomy 2 months ago.  Her neck compression symptoms resolved, however, she did develop postsurgical hypocalcemia. She was admitted few days after surgery.   She was started on - Calcitriol 0.25 2x a day - Tums 4 tablets 3x a day -stopped 1 week ago!  Reviewed calcium level: 06/12/2017: Ca 6.7, PTH 14  (we called [facility] during the visit to obtain these levels) - she was off all her calcium supplements for a few days before these labs were checked 04/28/2017: Ionized calcium level low normal, at 4.5 (4.5-5.6) Lab Results  Component Value Date   CALCIUM 8.1 (L) 04/30/2017   CALCIUM 7.2 (L) 04/30/2017   CALCIUM 7.7 (L) 04/29/2017   CALCIUM 8.1 (L) 04/29/2017   CALCIUM 6.8 (L) 04/29/2017   CALCIUM 6.9 (L) 04/29/2017   CALCIUM 7.4 (L) 04/28/2017   CALCIUM 7.8 (L) 04/28/2017   CALCIUM 7.2 (L) 04/28/2017   CALCIUM 7.3 (L) 04/28/2017   CALCIUM 7.1 (L) 04/28/2017   CALCIUM 8.1 (L) 04/27/2017   CALCIUM 6.8 (L) 04/27/2017   CALCIUM 6.3 (LL) 04/27/2017   CALCIUM 7.0 (L) 04/27/2017   She still complains of tingling in hands and legs and eye twitching.   Pt is on levothyroxine 88 mcg daily, taken: - in am - fasting - 1h from b'fast  - 1h from Ca  (!) - no Fe - 1h from MVI (!) - no PPIs - not on Biotin  Reviewed and addended previous study reports: Thyroid ultrasound (10/04/2016): Thyroid was markedly heterogenous, with several nodules: Right lobe: 1. Right mid nodule 2.1 x 2.0 x 1.2 cm, solid, isoechoic 2. Right mid nodule 1.9 x 1.4 x 1.2 cm, solid, isoechoic 3. Right inferior nodule 2.4 x 2.3 x 2.0 cm, solid, isoechoic Left lobe: 1. Left mid nodule 2.4 x 1.7 x 2.1 cm, solid, hypoechoic-indication for biopsy 2. Left superior nodule 5.1 x 3.2 x 4.2 cm, solid, isoechoic-indication for biopsy 3. Left inferior nodule 1.8 x 1.5 x 1.4 cm, solid, isoechoic  12/28/2016: FNA of the dominant nodules: - Left mid nodule >> benign.  - Left upper nodule >> inconclusive due to insufficient sample  04/19/2017: Total thyroidectomy Thyroid, thyroidectomy, total - NODULAR HYPERPLASIA - BENIGN PARATHYROID TISSUE - NO MALIGNANCY IDENTIFIED  I reviewed pt's thyroid tests -normal, but this was before her surgery: Lab Results  Component Value Date   TSH 0.67 12/06/2016   FREET4 0.75 12/06/2016  - per Dr. Cordelia Pen note: TSH 0.44  Thyroid antibodies were negative: Thyroperoxidase Ab SerPl-aCnc 12/06/2016 <1  <9 IU/mL  Thyroglobulin Ab 12/06/2016 <1  < or = 1 IU/mL   Pt denies: - feeling nodules in neck - hoarseness - dysphagia - choking - SOB with lying down  No  FH of thyroid ds. No FH of thyroid cancer. No h/o radiation tx to head or neck.  No seaweed or kelp. No recent contrast studies. No herbal supplements. No Biotin use. No recent steroids use.   ROS: Constitutional: + weight gain/no weight loss, no fatigue, no subjective hyperthermia, no subjective hypothermia Eyes: no blurry vision, no xerophthalmia ENT: no sore throat, + see HPI Cardiovascular: no CP/no SOB/no palpitations/no leg swelling Respiratory: no cough/no SOB/no wheezing Gastrointestinal: no N/no V/no D/no C/no acid reflux Musculoskeletal: + muscle aches/no joint  aches Skin: no rashes, no hair loss Neurological: + tremors/no numbness/+ tingling/no dizziness + Abnormal bleeding during the last menstrual cycle  I reviewed pt's medications, allergies, PMH, social hx, family hx, and changes were documented in the history of present illness. Otherwise, unchanged from my initial visit note.  Past Medical History:  Diagnosis Date  . Anemia   . Fibroid   . GERD (gastroesophageal reflux disease)    hx of   . HPV (human papilloma virus) anogenital infection   . Medical history non-contributory   . Thyroid disease    Past Surgical History:  Procedure Laterality Date  . NO PAST SURGERIES    . THYROIDECTOMY N/A 04/19/2017   Procedure: TOTAL THYROIDECTOMY;  Surgeon: Armandina Gemma, MD;  Location: [facility];  Service: General;  Laterality: N/A;   Social History   Social History  . Marital status: Married    Spouse name: N/A  . Number of children: 3   Occupational History  . HR supervisor   Social History Main Topics  . Smoking status: Never Smoker  . Smokeless tobacco: Never Used  . Alcohol use No  . Drug use: No   Current Outpatient Medications on File Prior to Visit  Medication Sig Dispense Refill  . calcitRIOL (ROCALTROL) 0.25 MCG capsule Take 0.25 mcg by mouth 2 (two) times daily.    Marland Kitchen levothyroxine (SYNTHROID) 88 MCG tablet Take 1 tablet (88 mcg total) by mouth daily before breakfast. 30 tablet 3   No current facility-administered medications on file prior to visit.    No Known Allergies Family History  Problem Relation Age of Onset  . Diabetes type II Mother   . Breast cancer Mother   . Hypertension Mother   . Thyroid disease Neg Hx     PE: BP 108/62   Pulse 82   Ht 5\' 3"  (1.6 m)   Wt 153 lb 12.8 oz (69.8 kg)   SpO2 98%   BMI 27.24 kg/m  Wt Readings from Last 3 Encounters:  06/20/17 153 lb 12.8 oz (69.8 kg)  04/26/17 145 lb 1 oz (65.8 kg)  04/19/17 146 lb (66.2 kg)   Constitutional: Normal weight, in NAD Eyes: PERRLA,  EOMI, no exophthalmos ENT: moist mucous membranes,  thyroidectomy scar healing, no cervical lymphadenopathy Cardiovascular: RRR, No MRG Respiratory: CTA B Gastrointestinal: abdomen soft, NT, ND, BS+ Musculoskeletal: no deformities, strength intact in all 4 Skin: moist, warm, no rashes Neurological: no tremor with outstretched hands, DTR normal in all 4, neg. [place] si bilaterally  ASSESSMENT: 1. Postsurgical hypothyroidism  2. H/o MNG  Adequacy Reason Satisfactory For Evaluation. Diagnosis THYROID, FINE NEEDLE ASPIRATION, LEFT LOBE, LMP (SPECIMEN 1 OF 2 COLLECTED 12-28-2016) CONSISTENT WITH BENIGN FOLLICULAR NODULE (BETHESDA CATEGORY II). Enid Cutter MD Pathologist, Electronic Signature (Case signed 12/29/2016) Specimen Clinical Information Previous Biopsy: Today. Nodule 4 Left Mid 2.4 cm; Other 2 dimensions: 1.7 z 2.1 cm, Solid /almost completely solid, Hypoechoic, ACR TI-RADS total points: 4, Moderately suspicious  nodule Source Thyroid, Fine Needle Aspiration, Lt Lobe LMP, (Specimen 1 of 2, collected on 12/28/16 )  Adequacy Reason Satisfactory But Limited For Evaluation, Scant Cellularity. Diagnosis THYROID, FINE NEEDLE ASPIRATION, LEFT LOBE LUP (SPECIMEN 2 OF 2 COLLECTED [id number]) SCANT FOLLICULAR EPITHELIUM PRESENT (BETHESDA CATEGORY I). Enid Cutter MD Pathologist, Electronic Signature (Case signed 12/29/2016) Specimen Clinical Information Previous Biopsy: Today, Nodule 5 Left Superior 5.1 cm; Other 2 dimensions: 3.2 x 4.2cm, Solid / almost completely solid, Isoechoic, ACR TI-RADS total points: 3, Mildly suspicious nodule Source Thyroid, Fine Needle Aspiration, Lt Lobe LUP, (Specimen 2 of 2,collected on 12/28/16 )  3. Postsurgical hypocalcemia  PLAN: 1. Postsurgical hypothyroidism  - she was started on levothyroxine 88 mcg daily after her total thyroidectomy approximately 2 months ago.  However, she was taking calcium and multivitamins with breakfast, approximately 1  hour after levothyroxine, so her absorption of levothyroxine was decreased.  She is now off the calcium for the last week and I advised her to restart, but will move the first dose of calcium and also her multivitamins with lunch. - we discussed about taking the thyroid hormone every day, with water, >30 minutes before breakfast, separated by >4 hours from acid reflux medications, calcium, iron, multivitamins.  - will check thyroid tests in 1.5 months after starting to take the medication correctly: TSH and fT4  2. H/o MNG -Patient has a history of several thyroid nodules, of which 2 were concerning and recommended for biopsy.  1 of the biopsies was benign, however, the largest nodule of 5.1 cm had only an inconclusive biopsy due to lack of enough sample.  We discussed that usually we recommend a repeat biopsy for inconclusive nodules.  She was having significant neck compression symptoms and I referred her to surgery.  She had total thyroidectomy on 04/19/2017 by Dr. Harlow Asa with resolution of her neck compression symptoms - Her incision is healing well -She developed postsurgical hypocalcemia as a complication after the surgery.  3. Postsurgical hypocalcemia -Patient's calcium started to decrease after her surgery and she was actually admitted a few days after her surgery with a low calcium, at 6.6.  She was given IV calcium gluconate in the hospital and sent home on a high dose of calcium carbonate (4 tablets 4 times a day and also calcitriol 0.5 mcg twice a day).  At discharge, her calcium increased to 7.2. -She had an ionized calcium level low normal, at 4.5 (4.5-5.6) on 04/28/2017, but PTH was suppressed  -she had a repeat set of labs while off her calcium supplements few days ago and this showed a very low calcium and again a suppressed PTH at this visit, -I advised her to resume calcium, however, she was taking quite a lot of calcium at the same time, while the absorption is limited to 600 mg at the  time.  Therefore, I advised her to restart calcium but at 500 mg 3 times a day while continuing the calcitriol at the current dose of 0.25 mcg twice a day and I plan to have her back for labs in 1 week: Calcium, vitamin D, magnesium, calcitriol, phosphorus.  Component     Latest Ref Rng & Units 06/27/2017 06/27/2017 06/29/2017        11:58 AM 12:18 PM   Sodium     135 - 145 mEq/L   141  Potassium     3.5 - 5.1 mEq/L   4.4  Chloride     96 - 112 mEq/L   105  CO2     19 - 32 mEq/L   24  Glucose     70 - 99 mg/dL   68 (L)  BUN     6 - 23 mg/dL   15  Creatinine     0.40 - 1.20 mg/dL   0.82  Calcium     8.4 - 10.5 mg/dL   7.3 (L)  GFR     >60.00 mL/min   99.73  Magnesium     1.5 - 2.5 mg/dL 1.6 1.6   Calcium Ionized       CANCELED   VITD     30.00 - 100.00 ng/mL 14.92 (L) 15.24 (L)   Phosphorus     2.3 - 4.6 mg/dL 4.6 4.9 (H)    Calcitriol level is still pending. Vitamin D level is very low, will start 4000 units vitamin D daily.  We have to be careful with replacement due to slightly elevated phosphorus.  We may need to reduce calcitriol level to help with this. Unfortunately, magnesium, vitamin D, and phosphorus were run twice (we will cancel one set of labs). Calcium level is low, we just started 500 mg 3 times a day and will increase to: -2 tablets with lunch -2 tablets with dinner -1 tablet at bedtime I am hoping that after we replete her vitamin D we can reduce the calcium l replacement dose. For now, we will have her come back in 2 weeks for repeat calcium level.  Component     Latest Ref Rng & Units 06/27/2017  Vitamin D 1, 25 (OH) Total     18 - 72 pg/mL 41  Vitamin D3 1, 25 (OH)     pg/mL 41  Vitamin D2 1, 25 (OH)     pg/mL <8   Will reduce calcitriol supplementation to 0.25 mg only once a day, since phosphorus is high and we are starting regular vitamin D supplementation.  Philemon Kingdom, MD PhD [facility] Endocrinology

## 2017-06-27 ENCOUNTER — Other Ambulatory Visit (INDEPENDENT_AMBULATORY_CARE_PROVIDER_SITE_OTHER): Payer: BLUE CROSS/BLUE SHIELD

## 2017-06-27 ENCOUNTER — Other Ambulatory Visit: Payer: Self-pay | Admitting: Internal Medicine

## 2017-06-27 LAB — MAGNESIUM
MAGNESIUM: 1.6 mg/dL (ref 1.5–2.5)
MAGNESIUM: 1.6 mg/dL (ref 1.5–2.5)

## 2017-06-27 LAB — VITAMIN D 25 HYDROXY (VIT D DEFICIENCY, FRACTURES)
VITD: 14.92 ng/mL — ABNORMAL LOW (ref 30.00–100.00)
VITD: 15.24 ng/mL — ABNORMAL LOW (ref 30.00–100.00)

## 2017-06-27 LAB — PHOSPHORUS
PHOSPHORUS: 4.9 mg/dL — AB (ref 2.3–4.6)
Phosphorus: 4.6 mg/dL (ref 2.3–4.6)

## 2017-06-29 ENCOUNTER — Other Ambulatory Visit: Payer: Self-pay | Admitting: Internal Medicine

## 2017-06-29 ENCOUNTER — Encounter: Payer: Self-pay | Admitting: Internal Medicine

## 2017-06-29 LAB — VITAMIN D 1,25 DIHYDROXY
Vitamin D 1, 25 (OH)2 Total: 41 pg/mL (ref 18–72)
Vitamin D3 1, 25 (OH)2: 41 pg/mL

## 2017-06-29 LAB — BASIC METABOLIC PANEL
BUN: 15 mg/dL (ref 6–23)
CALCIUM: 7.3 mg/dL — AB (ref 8.4–10.5)
CO2: 24 mEq/L (ref 19–32)
CREATININE: 0.82 mg/dL (ref 0.40–1.20)
Chloride: 105 mEq/L (ref 96–112)
GFR: 99.73 mL/min (ref >60.00)
GLUCOSE: 68 mg/dL — AB (ref 70–99)
Potassium: 4.4 mEq/L (ref 3.5–5.1)
Sodium: 141 mEq/L (ref 135–145)

## 2017-06-29 LAB — CALCIUM, IONIZED

## 2017-06-29 NOTE — Addendum Note (Signed)
Addended by: Kaylyn Lim I on: 06/29/2017 04:23 PM   Modules accepted: Orders

## 2017-06-29 NOTE — Addendum Note (Signed)
Addended by: Kaylyn Lim I on: 06/29/2017 04:25 PM   Modules accepted: Orders

## 2017-07-05 ENCOUNTER — Telehealth: Payer: Self-pay

## 2017-07-05 NOTE — Telephone Encounter (Signed)
Called pt. No answer °

## 2017-08-16 ENCOUNTER — Ambulatory Visit: Payer: BLUE CROSS/BLUE SHIELD | Admitting: Internal Medicine

## 2017-08-16 DIAGNOSIS — Z0289 Encounter for other administrative examinations: Secondary | ICD-10-CM

## 2017-08-16 NOTE — Progress Notes (Deleted)
Patient ID: Selena Scott, female   DOB: 1980/07/20, 37 y.o.   MRN: [id number]    HPI  Selena Scott is a 37 y.o.-year-old female, initially referred by her PCP, Levin Bacon, NP now returning for f/u for Nontoxic multinodular goiter, postsurgical hypocalcemia. Last visit  2 months ago.  Reviewed history: Patient started to feel neck enlargement and pressure at the beginning of her pregnancy.  This persisted even after the birth of her baby in 12/2015.  At last visit, she was telling me that she was gagging on saliva, coughing, and snoring.  She saw her PCP and an ultrasound was obtained and showed several nodules.  She had thyroidectomy 03/2017 by Dr. Harlow Asa.  Unfortunately, she developed postsurgical hypoparathyroidism and hypocalcemia.  She is on: - Calcitriol 0.25 2x a day >> decrease to once a day after last visit  - Tums 4 tablets 3x a day >> 2 tablets with lunch, 2 tablets with dinner, 1 tablet at bedtime  Reviewed calcium level: Lab Results  Component Value Date   PTH 12 (L) 04/28/2017   PTH Comment 04/28/2017   Lab Results  Component Value Date   CALCIUM 7.3 (L) 06/29/2017   CALCIUM 8.1 (L) 04/30/2017   CALCIUM 7.2 (L) 04/30/2017   CALCIUM 7.7 (L) 04/29/2017   CALCIUM 8.1 (L) 04/29/2017   CALCIUM 6.8 (L) 04/29/2017   CALCIUM 6.9 (L) 04/29/2017   CALCIUM 7.4 (L) 04/28/2017   CALCIUM 7.8 (L) 04/28/2017   CALCIUM 7.2 (L) 04/28/2017   CALCIUM 7.3 (L) 04/28/2017   CALCIUM 7.1 (L) 04/28/2017   CALCIUM 8.1 (L) 04/27/2017   CALCIUM 6.8 (L) 04/27/2017   CALCIUM 6.3 (LL) 04/27/2017  06/12/2017: Ca 6.7, PTH 14  (we called [facility] during the visit to obtain these levels) - she was off all her calcium supplements for a few days before these labs were checked 04/28/2017: Ionized calcium level low normal, at 4.5 (4.5-5.6)  Reviewed labs from last visit (some repeated 2x - ?): Component     Latest Ref Rng & Units 06/27/2017 06/27/2017        11:58 AM 12:18 PM  Vitamin D 1, 25 (OH)  Total     18 - 72 pg/mL  41  Vitamin D3 1, 25 (OH)     pg/mL  41  Vitamin D2 1, 25 (OH)     pg/mL  <8  Magnesium     1.5 - 2.5 mg/dL 1.6 1.6  Calcium Ionized       CANCELED  VITD     30.00 - 100.00 ng/mL 14.92 (L) 15.24 (L)  Phosphorus     2.3 - 4.6 mg/dL 4.6 4.9 (H)   Since vitamin D was very low, we started 4000 units vitamin D daily.  We have to be careful with replacement due to slightly elevated phosphorus.  Her calcitriol level was normal and I advised her to reduce calcitriol dose to 0.25 mg only once a day to reduce her phosphorus to the normal range Magnesium level was normal. Her calcium level was 7.3 and unfortunately the ionized calcium was canceled.  We have just started 500 mg 3 times a day before the last set of labs and I advised her at that time to increase to: -2 tablets with lunch -2 tablets with dinner -1 tablet at bedtime  She still complains of tingling in hands and legs and eye twitching  Postsurgical hypothyroidism:  Pt is on levothyroxine 88 mcg daily, taken: - in am - fasting - at  least 30 min from b'fast - no Fe, PPIs - + Ca, MVI - >4h after LT4 (1h later at last OV) - not on Biotin  Reviewed previous study reports: Thyroid ultrasound (10/04/2016): Thyroid was markedly heterogenous, with several nodules: Right lobe: 1. Right mid nodule 2.1 x 2.0 x 1.2 cm, solid, isoechoic 2. Right mid nodule 1.9 x 1.4 x 1.2 cm, solid, isoechoic 3. Right inferior nodule 2.4 x 2.3 x 2.0 cm, solid, isoechoic Left lobe: 1. Left mid nodule 2.4 x 1.7 x 2.1 cm, solid, hypoechoic-indication for biopsy 2. Left superior nodule 5.1 x 3.2 x 4.2 cm, solid, isoechoic-indication for biopsy 3. Left inferior nodule 1.8 x 1.5 x 1.4 cm, solid, isoechoic  12/28/2016: FNA of the dominant nodules: - Left mid nodule >> benign.  - Left upper nodule >> inconclusive due to insufficient sample  04/19/2017: Total thyroidectomy Thyroid, thyroidectomy, total - NODULAR HYPERPLASIA -  BENIGN PARATHYROID TISSUE - NO MALIGNANCY IDENTIFIED  Reviewed patient's TFTs: Lab Results  Component Value Date   TSH 0.67 12/06/2016   FREET4 0.75 12/06/2016  - per Dr. Cordelia Pen note: TSH 0.44  Thyroid antibodies reviewed, negative: Thyroperoxidase Ab SerPl-aCnc 12/06/2016 <1  <9 IU/mL  Thyroglobulin Ab 12/06/2016 <1  < or = 1 IU/mL   Pt denies: - feeling nodules in neck - hoarseness - dysphagia - choking - SOB with lying down  No FH of thyroid ds. No FH of thyroid cancer. No h/o radiation tx to head or neck.  No seaweed or kelp. No recent contrast studies. No herbal supplements. No Biotin use. No recent steroids use.   ROS: Constitutional: no weight gain/no weight loss, no fatigue, no subjective hyperthermia, no subjective hypothermia Eyes: no blurry vision, no xerophthalmia ENT: no sore throat, + see HPI Cardiovascular: no CP/no SOB/no palpitations/no leg swelling Respiratory: no cough/no SOB/no wheezing Gastrointestinal: no N/no V/no D/no C/no acid reflux Musculoskeletal: no muscle aches/no joint aches Skin: no rashes, no hair loss Neurological: no tremors/no numbness/no tingling/no dizziness  I reviewed pt's medications, allergies, PMH, social hx, family hx, and changes were documented in the history of present illness. Otherwise, unchanged from my initial visit note.  Past Medical History:  Diagnosis Date  . Anemia   . Fibroid   . GERD (gastroesophageal reflux disease)    hx of   . HPV (human papilloma virus) anogenital infection   . Medical history non-contributory   . Thyroid disease    Past Surgical History:  Procedure Laterality Date  . NO PAST SURGERIES    . THYROIDECTOMY N/A 04/19/2017   Procedure: TOTAL THYROIDECTOMY;  Surgeon: Armandina Gemma, MD;  Location: [facility];  Service: General;  Laterality: N/A;   Social History   Social History  . Marital status: Married    Spouse name: N/A  . Number of children: 3   Occupational History  . HR  supervisor   Social History Main Topics  . Smoking status: Never Smoker  . Smokeless tobacco: Never Used  . Alcohol use No  . Drug use: No   Current Outpatient Medications on File Prior to Visit  Medication Sig Dispense Refill  . calcitRIOL (ROCALTROL) 0.25 MCG capsule Take 1 capsule (0.25 mcg total) by mouth 2 (two) times daily. 90 capsule 5  . levothyroxine (SYNTHROID) 88 MCG tablet Take 1 tablet (88 mcg total) by mouth daily before breakfast. 45 tablet 3   No current facility-administered medications on file prior to visit.    No Known Allergies Family History  Problem Relation  Age of Onset  . Diabetes type II Mother   . Breast cancer Mother   . Hypertension Mother   . Thyroid disease Neg Hx     PE: There were no vitals taken for this visit. Wt Readings from Last 3 Encounters:  06/20/17 153 lb 12.8 oz (69.8 kg)  04/26/17 145 lb 1 oz (65.8 kg)  04/19/17 146 lb (66.2 kg)   Constitutional: Normal weight, in NAD Eyes: PERRLA, EOMI, no exophthalmos ENT: moist mucous membranes, thyroidectomy scar healing, no masses felt in the neck , no cervical lymphadenopathy Cardiovascular: RRR, No MRG Respiratory: CTA B Gastrointestinal: abdomen soft, NT, ND, BS+ Musculoskeletal: no deformities, strength intact in all 4 Skin: moist, warm, no rashes Neurological: no tremor with outstretched hands, DTR normal in all 4  ASSESSMENT: 1. Postsurgical hypothyroidism  2. H/o MNG  Adequacy Reason Satisfactory For Evaluation. Diagnosis THYROID, FINE NEEDLE ASPIRATION, LEFT LOBE, LMP (SPECIMEN 1 OF 2 COLLECTED 12-28-2016) CONSISTENT WITH BENIGN FOLLICULAR NODULE (BETHESDA CATEGORY II). Enid Cutter MD Pathologist, Electronic Signature (Case signed 12/29/2016) Specimen Clinical Information Previous Biopsy: Today. Nodule 4 Left Mid 2.4 cm; Other 2 dimensions: 1.7 z 2.1 cm, Solid /almost completely solid, Hypoechoic, ACR TI-RADS total points: 4, Moderately suspicious nodule Source Thyroid,  Fine Needle Aspiration, Lt Lobe LMP, (Specimen 1 of 2, collected on 12/28/16 )  Adequacy Reason Satisfactory But Limited For Evaluation, Scant Cellularity. Diagnosis THYROID, FINE NEEDLE ASPIRATION, LEFT LOBE LUP (SPECIMEN 2 OF 2 COLLECTED [id number]) SCANT FOLLICULAR EPITHELIUM PRESENT (BETHESDA CATEGORY I). Enid Cutter MD Pathologist, Electronic Signature (Case signed 12/29/2016) Specimen Clinical Information Previous Biopsy: Today, Nodule 5 Left Superior 5.1 cm; Other 2 dimensions: 3.2 x 4.2cm, Solid / almost completely solid, Isoechoic, ACR TI-RADS total points: 3, Mildly suspicious nodule Source Thyroid, Fine Needle Aspiration, Lt Lobe LUP, (Specimen 2 of 2,collected on 12/28/16 )  3. Postsurgical hypocalcemia  PLAN: 1. Postsurgical hypothyroidism  -Patient was started on levothyroxine 88 mcg daily after her total thyroidectomy in 03/2017, however, she was taken calcium and multivitamins with breakfast, approximately 1 hour after levothyroxine at last visit, therefore, her absorption of levothyroxine was decreased.  At last visit, we moved these later in the day but did not check her TFTs then. - latest thyroid labs reviewed with pt >> normal, but these were checked before her surgery - she continues on LT4 88 mcg daily - pt feels good on this dose. - we discussed about taking the thyroid hormone every day, with water, >30 minutes before breakfast, separated by >4 hours from acid reflux medications, calcium, iron, multivitamins. Pt. is taking it correctly. - will check thyroid tests today: TSH and fT4 - If labs are abnormal, she will need to return for repeat TFTs in 1.5 months  2. H/o MNG -Patient has a history of several thyroid nodules, of which 2 were concerning and recommended for biopsy.  1 of the biopsies was benign, however, the largest nodule of 5.1 cm had only an inconclusive biopsy due to lack of enough sample.  Since she was having significant neck compression symptoms, I  referred her to surgery and she had total thyroidectomy on 04/19/2017 by Dr. Harlow Asa with resolution of her neck compression symptoms. -Her incision is almost healed -Unfortunately, she developed postsurgical hypocalcemia as a complication of her thyroidectomy  3. Postsurgical hypocalcemia -Patient's calcium started to decrease after her surgery and she was actually admitted a few days with a low calcium, is 6.6.  She was given IV calcium gluconate in the hospital  and sent home on a high dose of calcium carbonate and calcitriol 0.5 mcg twice a day.  At discharge, her calcium increased to 7.2.  Her PTH was found to be suppressed in the presence of a low normal ionized calcium at 4.5 (4.5-5.6).  At last visit, she was off her calcium supplements for a few days and the calcium was again low.  We started her again on calcium and called her back for repeat labs in several days.  Since the calcium level was still low, I advised her to increase the dose further to 5 tablets a day but as her phosphorus was slightly high, I also advised her to decrease calcitriol to 0.25 mcg once a day only.  Her 25 hydroxy vitamin D level was very low so we also started vitamin D 4000 units daily. - At today's visit I would like to recheck her calcium, vitamin D, phosphorus   Philemon Kingdom, MD PhD [facility] Endocrinology

## 2017-08-27 ENCOUNTER — Telehealth: Payer: Self-pay | Admitting: Internal Medicine

## 2017-08-27 DIAGNOSIS — E559 Vitamin D deficiency, unspecified: Secondary | ICD-10-CM

## 2017-08-27 NOTE — Telephone Encounter (Signed)
Called pt, faxed orders to [facility] [place].

## 2017-08-27 NOTE — Telephone Encounter (Signed)
Patient is asking you to send Labs order to [facility] so she can get her labs drawn She is there now. She didn't give me the number.

## 2017-10-05 DIAGNOSIS — E559 Vitamin D deficiency, unspecified: Secondary | ICD-10-CM | POA: Diagnosis not present

## 2017-10-06 LAB — VITAMIN D 25 HYDROXY (VIT D DEFICIENCY, FRACTURES): VIT D 25 HYDROXY: 36.6 ng/mL (ref 30.0–100.0)

## 2017-10-06 LAB — CALCIUM, IONIZED: Calcium, Ion: 3.9 mg/dL — ABNORMAL LOW (ref 4.5–5.6)

## 2017-10-08 ENCOUNTER — Other Ambulatory Visit: Payer: Self-pay

## 2017-10-08 MED ORDER — CALCITRIOL 0.25 MCG PO CAPS: 0.2500 ug | ORAL_CAPSULE | Freq: Every day | ORAL | 1 refills | Status: DC

## 2017-10-09 ENCOUNTER — Encounter: Payer: Self-pay | Admitting: Internal Medicine

## 2017-10-09 ENCOUNTER — Ambulatory Visit (INDEPENDENT_AMBULATORY_CARE_PROVIDER_SITE_OTHER): Payer: BLUE CROSS/BLUE SHIELD | Admitting: Internal Medicine

## 2017-10-09 DIAGNOSIS — E890 Postprocedural hypothyroidism: Secondary | ICD-10-CM | POA: Diagnosis not present

## 2017-10-09 DIAGNOSIS — E042 Nontoxic multinodular goiter: Secondary | ICD-10-CM

## 2017-10-09 MED ORDER — CALCITRIOL 0.25 MCG PO CAPS: 0.5000 ug | ORAL_CAPSULE | Freq: Two times a day (BID) | ORAL | 3 refills | Status: DC

## 2017-10-09 NOTE — Patient Instructions (Addendum)
Please continue: - Calcitriol 0.5 mcg 2x a day - vitamin D 4000 units daily  Please increase: - Calcium 2 tablets with L 2 tablets with D 1 tablet at bedtime  Please return in 10 days for labs (fasting).  Take the thyroid hormone (88 mcg) every day, with water, at least 30 minutes before breakfast, separated by at least 4 hours from: - acid reflux medications - calcium - iron - multivitamins  Please come back for a follow-up appointment in 3 months.

## 2017-10-09 NOTE — Progress Notes (Signed)
Patient ID: Selena Scott, female   DOB: 1981-03-03, 37 y.o.   MRN: [id number]    HPI  Selena Scott is a 37 y.o.-year-old female, initially referred by her PCP, Levin Bacon, NP now returning for f/u for Nontoxic multinodular goiter, postsurgical hypocalcemia. Last visit 3 months ago.  Reviewed hx: Patient started to feel neck enlargement and pressure at the beginning of her pregnancy.  This persisted even after the birth of her baby in 12/2015.  At last visit, she was telling me that she was gagging on saliva, coughing, and snoring.  She saw her PCP and an ultrasound was obtained and showed several nodules.   As she was still complaining of significant neck compression symptoms at last visit, I referred her to surgery and she had total thyroidectomy 2 months ago.  Her neck compression symptoms resolved, however, she did develop postsurgical hypocalcemia. She was admitted few days after surgery.   She is currently on: - Calcitriol 0.5 mcg 2x a day - Tums:   Not following this schedule!1 tablet with L 1 tablet with D - vitamin D 4000 units daily (started at last visit as vit D was low - latest vit D level normalized)  Reviewed calcium levels: Component     Latest Ref Rng & Units 10/05/2017  Calcium Ionized     4.5 - 5.6 mg/dL 3.9 (L)   Lab Results  Component Value Date   CALCIUM 7.3 (L) 06/29/2017   CALCIUM 8.1 (L) 04/30/2017   CALCIUM 7.2 (L) 04/30/2017   CALCIUM 7.7 (L) 04/29/2017   CALCIUM 8.1 (L) 04/29/2017   CALCIUM 6.8 (L) 04/29/2017   CALCIUM 6.9 (L) 04/29/2017   CALCIUM 7.4 (L) 04/28/2017   CALCIUM 7.8 (L) 04/28/2017   CALCIUM 7.2 (L) 04/28/2017   CALCIUM 7.3 (L) 04/28/2017   CALCIUM 7.1 (L) 04/28/2017   CALCIUM 8.1 (L) 04/27/2017   CALCIUM 6.8 (L) 04/27/2017   CALCIUM 6.3 (LL) 04/27/2017  06/12/2017: Ca 6.7, PTH 14  (we called [facility] during the visit to obtain these levels) - she was off all her calcium supplements for a few days before these labs were  checked 04/28/2017: Ionized calcium level low normal, at 4.5 (4.5-5.6)  Lab Results  Component Value Date   VD25OH 36.6 10/05/2017   VD25OH 15.24 (L) 06/27/2017   VD25OH 14.92 (L) 06/27/2017   Lab Results  Component Value Date   PHOS 4.9 (H) 06/27/2017   PHOS 4.6 06/27/2017   Lab Results  Component Value Date   MG 1.6 06/27/2017   MG 1.6 06/27/2017   MG 2.1 04/27/2017   MG 1.5 (L) 04/27/2017   MG 1.8 04/26/2017   MG 1.4 (L) 04/26/2017   She still complains of: - tingling  in hands and legs - cramping in toes - eye twitching  No: - perioral numbness  Pt is on levothyroxine 88 mcg daily, taken: - in am - fasting - at least 30 min from b'fast - + Ca, MVI - >4h later - no PPIs, Fe - not on Biotin  Reviewed study reports: Thyroid ultrasound (10/04/2016): Thyroid was markedly heterogenous, with several nodules: Right lobe: 1. Right mid nodule 2.1 x 2.0 x 1.2 cm, solid, isoechoic 2. Right mid nodule 1.9 x 1.4 x 1.2 cm, solid, isoechoic 3. Right inferior nodule 2.4 x 2.3 x 2.0 cm, solid, isoechoic Left lobe: 1. Left mid nodule 2.4 x 1.7 x 2.1 cm, solid, hypoechoic-indication for biopsy 2. Left superior nodule 5.1 x 3.2 x 4.2 cm, solid, isoechoic-indication  for biopsy 3. Left inferior nodule 1.8 x 1.5 x 1.4 cm, solid, isoechoic  12/28/2016: FNA of the dominant nodules: - Left mid nodule >> benign.  - Left upper nodule >> inconclusive due to insufficient sample  04/19/2017: Total thyroidectomy Thyroid, thyroidectomy, total - NODULAR HYPERPLASIA - BENIGN PARATHYROID TISSUE - NO MALIGNANCY IDENTIFIED  I reviewed pt's thyroid tests -normal, but this was before the Sx Lab Results  Component Value Date   TSH 0.67 12/06/2016   FREET4 0.75 12/06/2016  - per Dr. Cordelia Pen note: TSH 0.44  Thyroid antibodies were neg: Thyroperoxidase Ab SerPl-aCnc 12/06/2016 <1  <9 IU/mL  Thyroglobulin Ab 12/06/2016 <1  < or = 1 IU/mL   Pt denies: - feeling nodules in neck -  hoarseness - dysphagia - choking - SOB with lying down  No FH of thyroid ds.No FH of thyroid cancer. No h/o radiation tx to head or neck.  No seaweed or kelp. No recent contrast studies. No herbal supplements. No Biotin use. No recent steroids use.   ROS: Constitutional: no weight gain/no weight loss, no fatigue, no subjective hyperthermia, no subjective hypothermia Eyes: + blurry vision, no xerophthalmia ENT: no sore throat, + see HPI Cardiovascular: no CP/no SOB/no palpitations/no leg swelling Respiratory: no cough/no SOB/no wheezing Gastrointestinal: no N/no V/no D/no C/no acid reflux Musculoskeletal: no muscle aches/no joint aches, + mm cramps Skin: no rashes, no hair loss Neurological: no tremors/no numbness/+ tingling/no dizziness  I reviewed pt's medications, allergies, PMH, social hx, family hx, and changes were documented in the history of present illness. Otherwise, unchanged from my initial visit note.  Past Medical History:  Diagnosis Date  . Anemia   . Fibroid   . GERD (gastroesophageal reflux disease)    hx of   . HPV (human papilloma virus) anogenital infection   . Medical history non-contributory   . Thyroid disease    Past Surgical History:  Procedure Laterality Date  . NO PAST SURGERIES    . THYROIDECTOMY N/A 04/19/2017   Procedure: TOTAL THYROIDECTOMY;  Surgeon: Armandina Gemma, MD;  Location: [facility];  Service: General;  Laterality: N/A;   Social History   Social History  . Marital status: Married    Spouse name: N/A  . Number of children: 3   Occupational History  . HR supervisor   Social History Main Topics  . Smoking status: Never Smoker  . Smokeless tobacco: Never Used  . Alcohol use No  . Drug use: No   Current Outpatient Medications on File Prior to Visit  Medication Sig Dispense Refill  . calcitRIOL (ROCALTROL) 0.25 MCG capsule Take 1 capsule (0.25 mcg total) by mouth daily. 90 capsule 1  . levothyroxine (SYNTHROID) 88 MCG tablet Take 1  tablet (88 mcg total) by mouth daily before breakfast. 45 tablet 3   No current facility-administered medications on file prior to visit.    No Known Allergies Family History  Problem Relation Age of Onset  . Diabetes type II Mother   . Breast cancer Mother   . Hypertension Mother   . Thyroid disease Neg Hx    PE: BP 100/60   Pulse 72   Ht 5\' 3"  (1.6 m)   Wt 155 lb 12.8 oz (70.7 kg)   LMP 10/02/2017   SpO2 97%   Breastfeeding? No   BMI 27.60 kg/m  Wt Readings from Last 3 Encounters:  10/09/17 155 lb 12.8 oz (70.7 kg)  06/20/17 153 lb 12.8 oz (69.8 kg)  04/26/17 145 lb 1 oz (  65.8 kg)   Constitutional: normal weight, in NAD Eyes: PERRLA, EOMI, no exophthalmos ENT: moist mucous membranes, thyroidectomy scar healed, no cervical lymphadenopathy Cardiovascular: RRR, No MRG Respiratory: CTA B Gastrointestinal: abdomen soft, NT, ND, BS+ Musculoskeletal: no deformities, strength intact in all 4 Skin: moist, warm, no rashes Neurological: no tremor with outstretched hands, DTR normal in all 4, negative [place] sign B  ASSESSMENT: 1. Postsurgical hypothyroidism  2. H/o MNG  Adequacy Reason Satisfactory For Evaluation. Diagnosis THYROID, FINE NEEDLE ASPIRATION, LEFT LOBE, LMP (SPECIMEN 1 OF 2 COLLECTED 12-28-2016) CONSISTENT WITH BENIGN FOLLICULAR NODULE (BETHESDA CATEGORY II). Enid Cutter MD Pathologist, Electronic Signature (Case signed 12/29/2016) Specimen Clinical Information Previous Biopsy: Today. Nodule 4 Left Mid 2.4 cm; Other 2 dimensions: 1.7 z 2.1 cm, Solid /almost completely solid, Hypoechoic, ACR TI-RADS total points: 4, Moderately suspicious nodule Source Thyroid, Fine Needle Aspiration, Lt Lobe LMP, (Specimen 1 of 2, collected on 12/28/16 )  Adequacy Reason Satisfactory But Limited For Evaluation, Scant Cellularity. Diagnosis THYROID, FINE NEEDLE ASPIRATION, LEFT LOBE LUP (SPECIMEN 2 OF 2 COLLECTED [id number]) SCANT FOLLICULAR EPITHELIUM PRESENT (BETHESDA  CATEGORY I). Enid Cutter MD Pathologist, Electronic Signature (Case signed 12/29/2016) Specimen Clinical Information Previous Biopsy: Today, Nodule 5 Left Superior 5.1 cm; Other 2 dimensions: 3.2 x 4.2cm, Solid / almost completely solid, Isoechoic, ACR TI-RADS total points: 3, Mildly suspicious nodule Source Thyroid, Fine Needle Aspiration, Lt Lobe LUP, (Specimen 2 of 2,collected on 12/28/16 )  3. Postsurgical hypocalcemia  PLAN: 1. Postsurgical hypothyroidism  - latest thyroid labs reviewed with pt >> normal  - she continues on LT4 88 mcg daily - pt feels good on this dose. - we discussed about taking the thyroid hormone every day, with water, >30 minutes before breakfast, separated by >4 hours from acid reflux medications, calcium, iron, multivitamins. Pt. is taking it correctly, however, at last visit, she was taking this close to calcium and MVI so we could not check her TFTs. - will check thyroid tests at next lab draw in 10 days: TSH and fT4 - If labs are abnormal, she will need to return for repeat TFTs in 1.5 months  2. H/o MNG - Patient has a history of several thyroid nodules, of which 2 were concerning and recommended for biopsy.  1 of the biopsies was benign, however, the largest nodule of 5.1 cm had only an inconclusive biopsy due to lack of enough sample.  We discussed that usually we recommend a repeat biopsy for inconclusive nodules.  She was having significant neck compression symptoms and I referred her to surgery.  She had total thyroidectomy on 04/19/2017 by Dr. Harlow Asa with resolution of her neck compression symptoms - her incision is healed - She unfortunately developed postsurgical hypocalcemia   2. Postsurgical hypocalcemia - Patient's calcium started to decrease after her surgery and she was actually admitted a few days after this with a low calcium at 6.6%.  She was given IV calcium gluconate in the hospital and sent home on a high dose of calcium carbonate (4  tablets 4 times a day and also calcitriol 0.5 mg twice a day).  At discharge, her calcium increased to 7.2%.  Her PTH was found to be suppressed. - We checked her calcitriol level at last visit and this was normal, however, his phosphorus was high so we had to reduce the dose of calcitriol to avoid further increase in phosphorus.  However, she misunderstood instructions and she is continuing on 0.5 mcg twice a day.  -After her discharge  from the hospital, I advised her to decrease her calcium intake, however, at last visit, since calcium was low, I advised her to increase it to 5 tablets a day split in 3 doses.  She did not start this, as she was confused about the instructions she continues on 500 mg twice a day. A recent I ionized calcium level was low, at 3.9, so I advised her to increase her calcium to 2 tablets with lunch, 2 tablets with dinner, and 1 tablet at bedtime for now and rechecking her calcium level along with a phosphorus level in 2 weeks. - Her vitamin D was low at last visit so we started 4000 units vitamin D supplement.  The subsequent vitamin D level checked earlier this month was normal.   - we discussed that the next step would be to add HCTZ which can also preserve serum calcium.   - If not enough, next step would be Natpara, but she is wondering whether this needs to be continued for the rest of her life and unfortunately this is the case.  Long-term outcome studies are lacking for this medication. - Patient will return for labs in 10 days.  I advised her to come fasting to get an accurate phosphorus result - I will see her back in 3 months.  Orders Placed This Encounter  Procedures  . Phosphorus  . Calcium, ionized  . TSH  . T4, free    Philemon Kingdom, MD PhD [facility] Endocrinology

## 2017-10-22 ENCOUNTER — Other Ambulatory Visit: Payer: BLUE CROSS/BLUE SHIELD

## 2017-10-24 ENCOUNTER — Other Ambulatory Visit (INDEPENDENT_AMBULATORY_CARE_PROVIDER_SITE_OTHER): Payer: BLUE CROSS/BLUE SHIELD

## 2017-10-24 DIAGNOSIS — E890 Postprocedural hypothyroidism: Secondary | ICD-10-CM

## 2017-10-24 LAB — TSH: TSH: 4.22 u[IU]/mL (ref 0.35–4.50)

## 2017-10-24 LAB — PHOSPHORUS: PHOSPHORUS: 4.5 mg/dL (ref 2.3–4.6)

## 2017-10-24 LAB — T4, FREE: Free T4: 0.83 ng/dL (ref 0.60–1.60)

## 2017-10-25 LAB — CALCIUM, IONIZED: CALCIUM ION: 4.69 mg/dL — AB (ref 4.8–5.6)

## 2017-10-26 ENCOUNTER — Telehealth: Payer: Self-pay | Admitting: Endocrinology

## 2017-10-26 MED ORDER — CALCITRIOL 0.25 MCG PO CAPS: 1.2500 ug | ORAL_CAPSULE | Freq: Every day | ORAL | 3 refills | Status: DC

## 2017-10-26 NOTE — Telephone Encounter (Signed)
Pt would like to know how she is supposed to take the medication because now its 2 in the AM and 2 in the PM

## 2017-10-26 NOTE — Telephone Encounter (Signed)
Pt is aware and will call back for lab visit

## 2017-10-26 NOTE — Telephone Encounter (Signed)
prob does not matter, but if I was you, I would take 3 qam and 2 qpm

## 2017-10-26 NOTE — Telephone Encounter (Signed)
please call patient: Calcium is improved, but still low.  Please increase the calcitriol to 5 pills per day.  Please recheck the blood test in approx 1 week.

## 2017-11-14 ENCOUNTER — Telehealth: Payer: Self-pay | Admitting: Internal Medicine

## 2017-11-14 ENCOUNTER — Other Ambulatory Visit: Payer: Self-pay | Admitting: Internal Medicine

## 2017-11-14 NOTE — Telephone Encounter (Signed)
Patient is coming in for labs 11/15/17. Patient was told to schedule appointment with Dr. Cruzita Lederer 1 week after starting medication dosage change (ordered by Dr. Loanne Drilling). Patient started new dosage 11/05/17. Please call patient and advise at ph# [phone]. I do not see any open slots within the next week-only 11/15/17 which patient has not had labs done yet and felt follow up without lab results would defeat the purpose.

## 2017-11-14 NOTE — Telephone Encounter (Signed)
I looked at her latest labs and they are actually great.  I agree, calcium is not quite in the normal range, but in patients with postoperative hypocalcemia, a slightly lower calcium level is acceptable.  We can have her back for recheck after she increase her calcitriol level but she does not need appointment quite now.  I will keep an eye open for her labs and communicate with her.

## 2017-11-14 NOTE — Telephone Encounter (Signed)
Please advise on below  

## 2017-11-15 ENCOUNTER — Other Ambulatory Visit (INDEPENDENT_AMBULATORY_CARE_PROVIDER_SITE_OTHER): Payer: BLUE CROSS/BLUE SHIELD

## 2017-11-15 ENCOUNTER — Telehealth: Payer: Self-pay | Admitting: Internal Medicine

## 2017-11-15 LAB — PHOSPHORUS: Phosphorus: 4.6 mg/dL (ref 2.3–4.6)

## 2017-11-15 NOTE — Telephone Encounter (Signed)
Patient was in the office today for lab work and wanted to check the status of message from yesterday. Would like the a nurse to call her Please advise

## 2017-11-16 LAB — CALCIUM, IONIZED: CALCIUM ION: 4.37 mg/dL — AB (ref 4.8–5.6)

## 2017-11-16 NOTE — Telephone Encounter (Signed)
Handled in previous telephone call

## 2017-11-16 NOTE — Telephone Encounter (Signed)
Pt is aware.  

## 2018-01-21 ENCOUNTER — Other Ambulatory Visit (INDEPENDENT_AMBULATORY_CARE_PROVIDER_SITE_OTHER): Payer: BLUE CROSS/BLUE SHIELD

## 2018-01-22 LAB — PTH, INTACT AND CALCIUM
Calcium: 8.2 mg/dL — ABNORMAL LOW (ref 8.6–10.2)
PTH: 6 pg/mL — ABNORMAL LOW (ref 14–64)

## 2018-01-24 ENCOUNTER — Encounter: Payer: Self-pay | Admitting: Internal Medicine

## 2018-01-24 ENCOUNTER — Ambulatory Visit (INDEPENDENT_AMBULATORY_CARE_PROVIDER_SITE_OTHER): Payer: BLUE CROSS/BLUE SHIELD | Admitting: Internal Medicine

## 2018-01-24 VITALS — BP 100/70 | HR 90 | Ht 63.0 in | Wt 160.0 lb

## 2018-01-24 DIAGNOSIS — E042 Nontoxic multinodular goiter: Secondary | ICD-10-CM | POA: Diagnosis not present

## 2018-01-24 DIAGNOSIS — E890 Postprocedural hypothyroidism: Secondary | ICD-10-CM

## 2018-01-24 MED ORDER — CALCITRIOL 0.5 MCG PO CAPS: 0.5000 ug | ORAL_CAPSULE | Freq: Two times a day (BID) | ORAL | 3 refills | Status: DC

## 2018-01-24 MED ORDER — LEVOTHYROXINE SODIUM 88 MCG PO TABS: 88.0000 ug | ORAL_TABLET | Freq: Every day | ORAL | 3 refills | Status: DC

## 2018-01-24 NOTE — Progress Notes (Signed)
Patient ID: Selena Scott, female   DOB: [birth year], 37 y.o.   MRN: [id number]    HPI  Karoline Sneeringer is a 37 y.o.-year-old female, initially referred by her PCP, Levin Bacon, NP now returning for f/u for Nontoxic multinodular goiter, postsurgical hypocalcemia. Last visit 3.5 months ago.  She cries in the office today as she is not feeling better since last visit.  She feels more dizzy, presyncopal, and very tired.  She also feels that she has incoordination and tells me that she has to have somebody to be close to her when she is walking within her office.  Also, when she sits down she may have twitching of her muscles and cramping in her hands.  No perioral numbness.  She brings in FMLA along with her that I need to fill out for her to be able to have time to recover from these episodes while at work.  Reviewed history: Patient started to feel neck enlargement and pressure at the beginning of her pregnancy.  This persisted even after the birth of her baby in 12/2015.  At last visit, she was telling me that she was gagging on saliva, coughing, and snoring.  She saw her PCP and an ultrasound was obtained and showed several nodules.   As she was still complaining of significant neck compression symptoms at last visit, I referred her to surgery and she had total thyroidectomy 2 months ago.  Her neck compression symptoms resolved, however, she did develop postsurgical hypocalcemia. She was admitted few days after surgery.   She is on: - Calcitriol 0.25 mcg 5x a day - Tums:  2 tablets with L 2 tablets with D   - vitamin D 4000 units daily  Reviewed calcium levels: Lab Results  Component Value Date   CALCIUM 8.2 (L) 01/21/2018   CALCIUM 7.3 (L) 06/29/2017   CALCIUM 8.1 (L) 04/30/2017   CALCIUM 7.2 (L) 04/30/2017   CALCIUM 7.7 (L) 04/29/2017   CALCIUM 8.1 (L) 04/29/2017   CALCIUM 6.8 (L) 04/29/2017   CALCIUM 6.9 (L) 04/29/2017   CALCIUM 7.4 (L) 04/28/2017   CALCIUM 7.8 (L) 04/28/2017   CALCIUM 7.2 (L) 04/28/2017   CALCIUM 7.3 (L) 04/28/2017   CALCIUM 7.1 (L) 04/28/2017   CALCIUM 8.1 (L) 04/27/2017   CALCIUM 6.8 (L) 04/27/2017   Component     Latest Ref Rng & Units 10/05/2017 10/24/2017 11/15/2017  Calcium Ionized     4.8 - 5.6 mg/dL 3.9 (L) 4.69 (L) 4.37 (L)   06/12/2017: Ca 6.7, PTH 14  (we called [facility] during the visit to obtain these levels) - she was off all her calcium supplements for a few days before these labs were checked 04/28/2017: Ionized calcium level low normal, at 4.5 (4.5-5.6)  Vitamin D levels reviewed: Lab Results  Component Value Date   VD25OH 36.6 10/05/2017   VD25OH 15.24 (L) 06/27/2017   VD25OH 14.92 (L) 06/27/2017   Lab Results  Component Value Date   PHOS 4.6 11/15/2017   PHOS 4.5 10/24/2017   PHOS 4.9 (H) 06/27/2017   PHOS 4.6 06/27/2017   Lab Results  Component Value Date   MG 1.6 06/27/2017   MG 1.6 06/27/2017   MG 2.1 04/27/2017   MG 1.5 (L) 04/27/2017   MG 1.8 04/26/2017   MG 1.4 (L) 04/26/2017   Pt is on levothyroxine 88 mcg daily, taken: - in am - fasting - at least 30 min from b'fast - no Fe, PPIs - + Calcium and multivitamins more than  4 hours later - not on Biotin  Reviewed her study reports: Thyroid ultrasound (10/04/2016): Thyroid was markedly heterogenous, with several nodules: Right lobe: 1. Right mid nodule 2.1 x 2.0 x 1.2 cm, solid, isoechoic 2. Right mid nodule 1.9 x 1.4 x 1.2 cm, solid, isoechoic 3. Right inferior nodule 2.4 x 2.3 x 2.0 cm, solid, isoechoic Left lobe: 1. Left mid nodule 2.4 x 1.7 x 2.1 cm, solid, hypoechoic-indication for biopsy 2. Left superior nodule 5.1 x 3.2 x 4.2 cm, solid, isoechoic-indication for biopsy 3. Left inferior nodule 1.8 x 1.5 x 1.4 cm, solid, isoechoic  12/28/2016: FNA of the dominant nodules: - Left mid nodule >> benign.  - Left upper nodule >> inconclusive due to insufficient sample  04/19/2017: Total thyroidectomy Thyroid, thyroidectomy, total - NODULAR  HYPERPLASIA - BENIGN PARATHYROID TISSUE - NO MALIGNANCY IDENTIFIED  Reviewed patient's TFTs: Normal at last check: Lab Results  Component Value Date   TSH 4.22 10/24/2017   TSH 0.67 12/06/2016   FREET4 0.83 10/24/2017   FREET4 0.75 12/06/2016  - per Dr. Cordelia Pen note: TSH 0.44  Thyroid antibodies were negative: Thyroperoxidase Ab SerPl-aCnc 12/06/2016 <1  <9 IU/mL  Thyroglobulin Ab 12/06/2016 <1  < or = 1 IU/mL   Pt denies: - feeling nodules in neck - hoarseness - dysphagia - choking - SOB with lying down  No FH of thyroid ds.No FH of thyroid cancer. No h/o radiation tx to head or neck.  No herbal supplements. No Biotin use. No recent steroids use.   ROS: Constitutional: + Please see above Eyes: + Blurry vision, no xerophthalmia ENT: no sore throat, + see HPI Cardiovascular: no CP/no SOB/no palpitations/no leg swelling Respiratory: + Cough/no SOB/no wheezing Gastrointestinal: + N/no V/no D/no C/no acid reflux Musculoskeletal: + Muscle cramping/no joint aches Skin: no rashes, no hair loss Neurological: no tremors/no numbness/+ tingling/+ dizziness  I reviewed pt's medications, allergies, PMH, social hx, family hx, and changes were documented in the history of present illness. Otherwise, unchanged from my initial visit note.  Past Medical History:  Diagnosis Date  . Anemia   . Fibroid   . GERD (gastroesophageal reflux disease)    hx of   . HPV (human papilloma virus) anogenital infection   . Medical history non-contributory   . Thyroid disease    Past Surgical History:  Procedure Laterality Date  . NO PAST SURGERIES    . THYROIDECTOMY N/A 04/19/2017   Procedure: TOTAL THYROIDECTOMY;  Surgeon: Armandina Gemma, MD;  Location: [facility];  Service: General;  Laterality: N/A;   Social History   Social History  . Marital status: Married    Spouse name: N/A  . Number of children: 3   Occupational History  . HR supervisor   Social History Main Topics  . Smoking  status: Never Smoker  . Smokeless tobacco: Never Used  . Alcohol use No  . Drug use: No   Current Outpatient Medications on File Prior to Visit  Medication Sig Dispense Refill  . calcitRIOL (ROCALTROL) 0.25 MCG capsule Take 5 capsules (1.25 mcg total) by mouth daily. 360 capsule 3  . levothyroxine (SYNTHROID) 88 MCG tablet Take 1 tablet (88 mcg total) by mouth daily before breakfast. 45 tablet 3   No current facility-administered medications on file prior to visit.    No Known Allergies Family History  Problem Relation Age of Onset  . Diabetes type II Mother   . Breast cancer Mother   . Hypertension Mother   . Thyroid disease Neg  Hx    PE: BP 100/70   Pulse 90   Ht 5\' 3"  (1.6 m) Comment: measured  Wt 160 lb (72.6 kg)   LMP 01/14/2018   SpO2 95%   BMI 28.34 kg/m  Wt Readings from Last 3 Encounters:  01/24/18 160 lb (72.6 kg)  10/09/17 155 lb 12.8 oz (70.7 kg)  06/20/17 153 lb 12.8 oz (69.8 kg)   Constitutional: Slightly overweight, in distress because of her symptoms Eyes: PERRLA, EOMI, no exophthalmos ENT: moist mucous membranes, no neck masses palpable, thyroidectomy scar healed, no cervical lymphadenopathy Cardiovascular: RRR, No MRG Respiratory: CTA B Gastrointestinal: abdomen soft, NT, ND, BS+ Musculoskeletal: no deformities, strength intact in all 4 Skin: moist, warm, no rashes Neurological: no tremor with outstretched hands, DTR normal in all 4, [place] sign neg. B  ASSESSMENT: 1. Postsurgical hypothyroidism  2. H/o MNG  Adequacy Reason Satisfactory For Evaluation. Diagnosis THYROID, FINE NEEDLE ASPIRATION, LEFT LOBE, LMP (SPECIMEN 1 OF 2 COLLECTED 12-28-2016) CONSISTENT WITH BENIGN FOLLICULAR NODULE (BETHESDA CATEGORY II). Enid Cutter MD Pathologist, Electronic Signature (Case signed 12/29/2016) Specimen Clinical Information Previous Biopsy: Today. Nodule 4 Left Mid 2.4 cm; Other 2 dimensions: 1.7 z 2.1 cm, Solid /almost completely solid, Hypoechoic,  ACR TI-RADS total points: 4, Moderately suspicious nodule Source Thyroid, Fine Needle Aspiration, Lt Lobe LMP, (Specimen 1 of 2, collected on 12/28/16 )  Adequacy Reason Satisfactory But Limited For Evaluation, Scant Cellularity. Diagnosis THYROID, FINE NEEDLE ASPIRATION, LEFT LOBE LUP (SPECIMEN 2 OF 2 COLLECTED [id number]) SCANT FOLLICULAR EPITHELIUM PRESENT (BETHESDA CATEGORY I). Enid Cutter MD Pathologist, Electronic Signature (Case signed 12/29/2016) Specimen Clinical Information Previous Biopsy: Today, Nodule 5 Left Superior 5.1 cm; Other 2 dimensions: 3.2 x 4.2cm, Solid / almost completely solid, Isoechoic, ACR TI-RADS total points: 3, Mildly suspicious nodule Source Thyroid, Fine Needle Aspiration, Lt Lobe LUP, (Specimen 2 of 2,collected on 12/28/16 )  3. Postsurgical hypocalcemia  PLAN: 1. Postsurgical hypothyroidism  - latest thyroid labs reviewed with pt >> normal 09/2017 - she continues on LT4 88 mcg daily - pt feels good on this dose. - we discussed about taking the thyroid hormone every day, with water, >30 minutes before breakfast, separated by >4 hours from acid reflux medications, calcium, iron, multivitamins. Pt. is taking it correctly. - Refilled levothyroxine now  2. H/o MNG Patient with history of several thyroid nodules, of which 2 were concerning and qualified for biopsy.  1 of the biopsies was benign, however, the largest nodule, 5.1 cm had an inconclusive biopsy due to lack of enough sample.  We discussed that usually we recommend a repeat biopsy for inconclusive nodules.  She was having significant neck compression symptoms and I referred her to surgery.  She had total thyroidectomy on 04/19/2017 by Dr. Harlow Asa with resolution of her neck compression symptoms, but unfortunately she developed postsurgical hypocalcemia  2. Postsurgical hypocalcemia -Patient's calcium started to decrease after her surgery and she was actually admitted for a few days with a low  calcium, is 6.6.  She was given IV calcium gluconate in the hospital and sent home on high dose of calcium carbonate (4 tablets 4 times a day and also calcitriol 0.5 mg twice a day.  At discharge, her calcium increased to 7.2.  Her PTH was found to be suppressed. -We continue to adjust her supplements and is now on 2 tablets with lunch, 2 tablets with dinner, and 1 tablet at bedtime.  She is also taking 4000 units vitamin D daily and 0.5 mg calcitriol twice  a day. -Reviewed recent labs:  Vitamin D was normal 09/2017  PTH was low, 6, 12/2017  Calcium level was also low, at 8.2, 12/2017, but this is the best level she had since the surgery  Phosphorus level was normal 10/2017  Magnesium level was normal 06/2017 -The next step would be to add HCTZ, if needed.  I explained benefits and possible side effects.  Her blood pressure is already fairly low.  She refuses to try this for now -At this visit, we also discussed about Natpara, and I explained that this is expensive, and unfortunately long-term outcomes are not available for this medication and she will need to continue this lifelong.  However, she does not think she can continue to feel as poorly as she does now -She would like to seek another opinion and we discussed about the possible referral to an academic center.  I will place this referral. -I will see her as needed  Philemon Kingdom, MD PhD [facility] Endocrinology

## 2018-01-24 NOTE — Patient Instructions (Addendum)
Your latest lab results: Component     Latest Ref Rng & Units 01/21/2018  PTH, Intact     14 - 64 pg/mL 6 (L)  Calcium     8.6 - 10.2 mg/dL 8.2 (L)    Please continue: - Calcitriol 0.25 mcg 5x a day, but start 0.5 mcg 2x a day after you finish the lower dose tablets - Tums:  2 tablets with L 2 tablets with D  - vitamin D 4000 units daily  Please return to see me as needed.

## 2018-01-25 DIAGNOSIS — Z9089 Acquired absence of other organs: Secondary | ICD-10-CM | POA: Insufficient documentation

## 2018-01-25 DIAGNOSIS — Z6828 Body mass index (BMI) 28.0-28.9, adult: Secondary | ICD-10-CM | POA: Diagnosis not present

## 2018-01-25 DIAGNOSIS — E890 Postprocedural hypothyroidism: Secondary | ICD-10-CM | POA: Insufficient documentation

## 2018-01-25 DIAGNOSIS — R05 Cough: Secondary | ICD-10-CM | POA: Diagnosis not present

## 2018-01-25 DIAGNOSIS — M549 Dorsalgia, unspecified: Secondary | ICD-10-CM | POA: Diagnosis not present

## 2018-01-25 DIAGNOSIS — R062 Wheezing: Secondary | ICD-10-CM | POA: Diagnosis not present

## 2018-01-25 DIAGNOSIS — J0110 Acute frontal sinusitis, unspecified: Secondary | ICD-10-CM | POA: Diagnosis not present

## 2018-04-17 ENCOUNTER — Emergency Department (HOSPITAL_COMMUNITY): Payer: Self-pay

## 2018-04-17 ENCOUNTER — Encounter (HOSPITAL_COMMUNITY): Payer: Self-pay | Admitting: Emergency Medicine

## 2018-04-17 ENCOUNTER — Other Ambulatory Visit: Payer: Self-pay

## 2018-04-17 ENCOUNTER — Emergency Department (HOSPITAL_COMMUNITY)
Admission: EM | Admit: 2018-04-17 | Discharge: 2018-04-17 | Disposition: A | Discharge status: Home / Self Care | Payer: Self-pay | Attending: Emergency Medicine | Admitting: Emergency Medicine

## 2018-04-17 DIAGNOSIS — E892 Postprocedural hypoparathyroidism: Secondary | ICD-10-CM | POA: Insufficient documentation

## 2018-04-17 DIAGNOSIS — R42 Dizziness and giddiness: Secondary | ICD-10-CM | POA: Insufficient documentation

## 2018-04-17 DIAGNOSIS — Z79899 Other long term (current) drug therapy: Secondary | ICD-10-CM | POA: Insufficient documentation

## 2018-04-17 DIAGNOSIS — R531 Weakness: Secondary | ICD-10-CM | POA: Insufficient documentation

## 2018-04-17 DIAGNOSIS — R55 Syncope and collapse: Secondary | ICD-10-CM

## 2018-04-17 DIAGNOSIS — M542 Cervicalgia: Secondary | ICD-10-CM | POA: Insufficient documentation

## 2018-04-17 DIAGNOSIS — R5383 Other fatigue: Secondary | ICD-10-CM

## 2018-04-17 LAB — URINALYSIS, ROUTINE W REFLEX MICROSCOPIC
BILIRUBIN URINE: NEGATIVE
Glucose, UA: NEGATIVE mg/dL
Hgb urine dipstick: NEGATIVE
Ketones, ur: NEGATIVE mg/dL
Leukocytes, UA: NEGATIVE
Nitrite: NEGATIVE
Protein, ur: NEGATIVE mg/dL
Specific Gravity, Urine: 1.024 (ref 1.005–1.030)
pH: 6 (ref 5.0–8.0)

## 2018-04-17 LAB — CBC WITH DIFFERENTIAL/PLATELET
Abs Immature Granulocytes: 0.01 10*3/uL (ref 0.00–0.07)
Basophils Absolute: 0.1 10*3/uL (ref 0.0–0.1)
Basophils Relative: 1 %
Eosinophils Absolute: 0.1 10*3/uL (ref 0.0–0.5)
Eosinophils Relative: 2 %
HCT: 34.9 % — ABNORMAL LOW (ref 36.0–46.0)
Hemoglobin: 11.1 g/dL — ABNORMAL LOW (ref 12.0–15.0)
IMMATURE GRANULOCYTES: 0 %
Lymphocytes Relative: 44 %
Lymphs Abs: 2.6 10*3/uL (ref 0.7–4.0)
MCH: 26.1 pg (ref 26.0–34.0)
MCHC: 31.8 g/dL (ref 30.0–36.0)
MCV: 82.1 fL (ref 80.0–100.0)
MONOS PCT: 8 %
Monocytes Absolute: 0.5 10*3/uL (ref 0.1–1.0)
NEUTROS PCT: 45 %
Neutro Abs: 2.6 10*3/uL (ref 1.7–7.7)
Platelets: 286 10*3/uL (ref 150–400)
RBC: 4.25 MIL/uL (ref 3.87–5.11)
RDW: 15.9 % — ABNORMAL HIGH (ref 11.5–15.5)
WBC: 5.8 10*3/uL (ref 4.0–10.5)
nRBC: 0 % (ref 0.0–0.2)

## 2018-04-17 LAB — COMPREHENSIVE METABOLIC PANEL
ALK PHOS: 46 U/L (ref 38–126)
ALT: 19 U/L (ref 0–44)
AST: 18 U/L (ref 15–41)
Albumin: 4.1 g/dL (ref 3.5–5.0)
Anion gap: 10 (ref 5–15)
BUN: 12 mg/dL (ref 6–20)
CO2: 26 mmol/L (ref 22–32)
Calcium: 7.7 mg/dL — ABNORMAL LOW (ref 8.9–10.3)
Chloride: 104 mmol/L (ref 98–111)
Creatinine, Ser: 0.8 mg/dL (ref 0.44–1.00)
GFR calc Af Amer: >60 mL/min (ref >60)
GFR calc non Af Amer: >60 mL/min (ref >60)
Glucose, Bld: 94 mg/dL (ref 70–99)
Potassium: 3.6 mmol/L (ref 3.5–5.1)
Sodium: 140 mmol/L (ref 135–145)
Total Bilirubin: 0.5 mg/dL (ref 0.3–1.2)
Total Protein: 7.3 g/dL (ref 6.5–8.1)

## 2018-04-17 LAB — I-STAT BETA HCG BLOOD, ED (MC, WL, AP ONLY): I-stat hCG, quantitative: <5 m[IU]/mL (ref <5)

## 2018-04-17 LAB — I-STAT TROPONIN, ED: Troponin i, poc: 0 ng/mL (ref 0.00–0.08)

## 2018-04-17 LAB — PHOSPHORUS: Phosphorus: 5.5 mg/dL — ABNORMAL HIGH (ref 2.5–4.6)

## 2018-04-17 LAB — MAGNESIUM: Magnesium: 1.8 mg/dL (ref 1.7–2.4)

## 2018-04-17 MED ORDER — NAPROXEN 375 MG PO TABS: 375.0000 mg | ORAL_TABLET | Freq: Two times a day (BID) | ORAL | 0 refills | Status: DC

## 2018-04-17 MED ORDER — SODIUM CHLORIDE 0.9 % IV SOLN
1000.0000 mL | INTRAVENOUS | Status: DC
  Administered 2018-04-17: 1000 mL via INTRAVENOUS

## 2018-04-17 MED ORDER — ORPHENADRINE CITRATE ER 100 MG PO TB12: 100.0000 mg | ORAL_TABLET | Freq: Two times a day (BID) | ORAL | 0 refills | Status: DC

## 2018-04-17 MED ORDER — IOHEXOL 300 MG/ML  SOLN
75.0000 mL | Freq: Once | INTRAMUSCULAR | Status: AC | PRN
  Administered 2018-04-17: 75 mL via INTRAVENOUS

## 2018-04-17 MED ORDER — SODIUM CHLORIDE 0.9 % IV BOLUS (SEPSIS)
1000.0000 mL | Freq: Once | INTRAVENOUS | Status: AC
  Administered 2018-04-17: 1000 mL via INTRAVENOUS

## 2018-04-17 MED ORDER — SODIUM CHLORIDE (PF) 0.9 % IJ SOLN
INTRAMUSCULAR | Status: AC
  Filled 2018-04-17: qty 50

## 2018-04-17 NOTE — ED Triage Notes (Signed)
Pt complaint of migraine headache with neck stiffness and blurry vision.

## 2018-04-17 NOTE — ED Provider Notes (Signed)
[facility] DEPT Provider Note   CSN: [id number] Arrival date & time: 04/17/18  1248     History   Chief Complaint Chief Complaint  Patient presents with  . Migraine    HPI Selena Scott is a 38 y.o. female.  HPI Patient reports she has not really felt like herself since her thyroid surgery.  That was about a year ago.  She reports she had some problems with low calcium after the procedure.  She reports she takes a lot of pills every day throughout the day to keep her calcium up.  She states however over about the past 4 months she started to get fairly extreme amounts of fatigue.  She reports that she is exhausted most of the time.  She has a hard time even doing her normal activities.  She reports that yesterday she laid down at 6 PM and slept through until the morning.  She reports that is not normal for her.  She reports today she had an episode where she felt extremely lightheaded and close to passing out.  She reports her vision got fuzzy and she thought she was going to pass out but she did not.  Although triage complaint is "migraine".  Patient denies she is having any headache.  She reports sometimes she has some headache but mostly she has a problem with pain in her neck.  She states has been kind of an ongoing problem but has gotten worse over the past several weeks.  She reports she feels pain along the left side of her neck and a fullness feeling.  She denies any difficulty breathing.  She reports she just feels terrible and not like herself.  She reports her menstrual cycle is somewhat irregular but she does not have any exceptionally heavy vaginal bleeding.  She denies any dark or black stool.  She reports she is eating a normal diet.  She is not having vomiting or appetite loss. Past Medical History:  Diagnosis Date  . Anemia   . Fibroid   . GERD (gastroesophageal reflux disease)    hx of   . HPV (human papilloma virus) anogenital infection     . Medical history non-contributory   . Thyroid disease     Patient Active Problem List   Diagnosis Date Noted  . Postsurgical hypothyroidism 06/15/2017  . Iatrogenic hypocalcemia 04/26/2017  . Viral gastroenteritis 04/26/2017  . Nausea vomiting and diarrhea   . H/o Multinodular goiter 11/17/2016  . Indication for care in labor or delivery 01/13/2016  . SVD (spontaneous vaginal delivery) 01/13/2016  . Advanced maternal age in multigravida 08/06/2015    Past Surgical History:  Procedure Laterality Date  . NO PAST SURGERIES    . THYROIDECTOMY N/A 04/19/2017   Procedure: TOTAL THYROIDECTOMY;  Surgeon: Armandina Gemma, MD;  Location: [facility];  Service: General;  Laterality: N/A;     OB History    Gravida  3   Para  3   Term  3   Preterm      AB      Living  3     SAB      TAB      Ectopic      Multiple  0   Live Births  3            Home Medications    Prior to Admission medications   Medication Sig Start Date End Date Taking? Authorizing Provider  calcitRIOL (ROCALTROL) 0.25 MCG capsule Take  0.5 mcg by mouth 2 (two) times daily.   Yes [provider]  calcium carbonate (TUMS - DOSED IN MG ELEMENTAL CALCIUM) 500 MG chewable tablet Chew 1 tablet by mouth 3 (three) times daily.   Yes [provider]  cholecalciferol (VITAMIN D3) 25 MCG (1000 UT) tablet Take 2,000 Units by mouth daily.   Yes [provider]  levothyroxine (SYNTHROID) 88 MCG tablet Take 1 tablet (88 mcg total) by mouth daily before breakfast. 01/24/18  Yes Philemon Kingdom, MD  albuterol (PROVENTIL HFA;VENTOLIN HFA) 108 (90 Base) MCG/ACT inhaler Inhale 2 puffs into the lungs every 4 (four) hours as needed for wheezing or shortness of breath.  01/25/18   [provider]  calcitRIOL (ROCALTROL) 0.5 MCG capsule Take 1 capsule (0.5 mcg total) by mouth 2 (two) times daily. Patient not taking: Reported on 04/17/2018 01/24/18   Philemon Kingdom, MD  naproxen (NAPROSYN)  375 MG tablet Take 1 tablet (375 mg total) by mouth 2 (two) times daily. 04/17/18   Charlesetta Shanks, MD  orphenadrine (NORFLEX) 100 MG tablet Take 1 tablet (100 mg total) by mouth 2 (two) times daily. 04/17/18   Charlesetta Shanks, MD    Family History Family History  Problem Relation Age of Onset  . Diabetes type II Mother   . Breast cancer Mother   . Hypertension Mother   . Thyroid disease Neg Hx     Social History Social History   Tobacco Use  . Smoking status: Never Smoker  . Smokeless tobacco: Never Used  Substance Use Topics  . Alcohol use: No  . Drug use: No     Allergies   Patient has no known allergies.   Review of Systems Review of Systems 10 Systems reviewed and are negative for acute change except as noted in the HPI.   Physical Exam Updated Vital Signs BP 101/74   Pulse 65   Temp 97.9 F (36.6 C) (Oral)   Resp 14   LMP 04/03/2018   SpO2 99%   Physical Exam Constitutional:      Appearance: She is well-developed.  HENT:     Head: Normocephalic and atraumatic.  Eyes:     Pupils: Pupils are equal, round, and reactive to light.  Neck:     Musculoskeletal: Neck supple.     Comments: Patient has thyroidectomy scar.  She does not have significant amount of mass or swelling of the soft tissues of the neck.  She does have some tenderness to the soft tissues over the left lateral neck.  No specifically masslike structures. Cardiovascular:     Rate and Rhythm: Normal rate and regular rhythm.     Heart sounds: Normal heart sounds.  Pulmonary:     Effort: Pulmonary effort is normal.     Breath sounds: Normal breath sounds.  Abdominal:     General: Bowel sounds are normal. There is no distension.     Palpations: Abdomen is soft.     Tenderness: There is no abdominal tenderness.  Musculoskeletal: Normal range of motion.  Skin:    General: Skin is warm and dry.  Neurological:     Mental Status: She is alert and oriented to person, place, and time.     GCS:  GCS eye subscore is 4. GCS verbal subscore is 5. GCS motor subscore is 6.     Coordination: Coordination normal.     Comments: Movements coordinated purposeful symmetric.  Patellar reflexes 2+.      ED Treatments / Results  Labs (all labs ordered are listed, but only abnormal results are displayed) Labs Reviewed  COMPREHENSIVE METABOLIC PANEL - Abnormal; Notable for the following components:      Result Value   Calcium 7.7 (*)    All other components within normal limits  CBC WITH DIFFERENTIAL/PLATELET - Abnormal; Notable for the following components:   Hemoglobin 11.1 (*)    HCT 34.9 (*)    RDW 15.9 (*)    All other components within normal limits  PHOSPHORUS - Abnormal; Notable for the following components:   Phosphorus 5.5 (*)    All other components within normal limits  CALCIUM, IONIZED - Abnormal; Notable for the following components:   Calcium, Ionized, Serum 4.1 (*)    All other components within normal limits  URINALYSIS, ROUTINE W REFLEX MICROSCOPIC  MAGNESIUM  I-STAT BETA HCG BLOOD, ED ([facility], [facility], [facility] ONLY)  I-STAT TROPONIN, ED    EKG EKG Interpretation  Date/Time:  Wednesday April 17 2018 19:01:41 EST Ventricular Rate:  67 PR Interval:    QRS Duration: 83 QT Interval:  415 QTC Calculation: 439 R Axis:   5 Text Interpretation:  Sinus rhythm Probable anteroseptal infarct, old normal, no change from previous Confirmed by Charlesetta Shanks [phone]) on 04/17/2018 10:36:04 PM Also confirmed by Charlesetta Shanks [phone]), editor Philomena Doheny [phone])  on 04/18/2018 7:25:04 AM   Radiology No results found.  Procedures Procedures (including critical care time)  Medications Ordered in ED Medications  sodium chloride 0.9 % bolus 1,000 mL (0 mLs Intravenous Stopped 04/17/18 2019)  iohexol (OMNIPAQUE) 300 MG/ML solution 75 mL (75 mLs Intravenous Contrast Given 04/17/18 2047)     Initial Impression / Assessment and Plan / ED Course  I have reviewed the triage vital signs  and the nursing notes.  Pertinent labs & imaging results that were available during my care of the patient were reviewed by me and considered in my medical decision making (see chart for details).  Clinical Course as of Apr 25 1603  Wed Apr 17, 2018  2207 Lab reports that the free T3 and ionized calcium are batched and will not be available until tomorrow.   [MP]    Clinical Course User Index [MP] Charlesetta Shanks, MD   Patient has had indolent and multiple symptoms since her thyroidectomy, particularly extreme fatigue symptoms have been worsening over the past few months and then increasingly over the past weeks to days.  Patient felt near syncopal.  She also reports that she gets a fairly intense pain on the left side of her neck.  This radiates up towards her head but she does not have any headache or generalized headache.  Diagnostic evaluation does not show acute anomaly.  Patient's clinical appearance is good.  Physical exam is normal.  CT scan of the neck does not show any structural anomalies or vascular anomalies.  Patient's thyroid surgery was almost a year ago but she still feels that symptoms are associated.  My recommendation is that he follows up with endocrinology and her primary care doctor.  Patient is discharged in good clinical condition and at this time safe for follow-up.  For neck pain will prescribe Norflex and naproxen.  Final Clinical Impressions(s) / ED Diagnoses   Final diagnoses:  General weakness  Fatigue, unspecified type  Near syncope  Cervical pain    ED Discharge Orders         Ordered    orphenadrine (NORFLEX) 100 MG tablet  2 times daily  04/17/18 2209    naproxen (NAPROSYN) 375 MG tablet  2 times daily,   Status:  Discontinued     04/17/18 2209    naproxen (NAPROSYN) 375 MG tablet  2 times daily     04/17/18 2219           Charlesetta Shanks, MD 04/24/18 [phone]

## 2018-04-17 NOTE — Discharge Instructions (Addendum)
1.  2 labs were done in the emergency department that will not have results until tomorrow.  That is the ionized calcium and free T3 (thyroid hormone level).  You should call either your family doctor or your endocrinologist tomorrow morning to have them follow-up on those test results. 2.  Continue to take all of your calcium supplement and thyroid medication as prescribed.  You are advised to follow-up with your endocrinologist to review your dosing and determine if any changes or increases are needed. 3.  Your neck pain appears to be due to muscular strain and spasm.  Try taking naproxen twice daily and Norflex twice daily as prescribed for the next 5 to 7 days.  You may also apply warm moist heat. 4.  You expressed having severe fatigue.  This needs to be reviewed with your family doctor.  You may need referral for other evaluation if symptoms are persisting. 5.  You had a CT scan done of your neck in the emergency department.  A CT scan test for many conditions but it does not test for a pinched nerve from a cervical disc protrusion.  If you are having significant ongoing pain or develop problems with pain shooting anterior arm weakness numbness or tingling, you may need an MRI.  Reviewed this with your doctor.

## 2018-04-17 NOTE — ED Notes (Signed)
Patient states she can urinate but upon standing for orthostatic vitals, vision got blurry/dizzy

## 2018-04-19 ENCOUNTER — Telehealth: Payer: Self-pay

## 2018-04-19 LAB — CALCIUM, IONIZED: Calcium, Ionized, Serum: 4.1 mg/dL — ABNORMAL LOW (ref 4.5–5.6)

## 2018-04-19 NOTE — Telephone Encounter (Signed)
Patient was recently released from the ER, and discharging MD stated that she needs to be seen by the endocrinologist that has been treating her for the past year.  Pt stated that during her last visit, she did ask Dr. Cruzita Lederer for a second opinion, but as of this time, she has not gotten that second opinion. Pt stated that yesterday from front desk staff, she was told that Dr. Cruzita Lederer has released her from her care due to the fact that she wanted a second opinion. - Dr. Cruzita Lederer, did you release her from your care permanently? If not, would you like to see her sooner rather than later, being that she was recently released from the hospital?

## 2018-04-19 NOTE — Telephone Encounter (Signed)
I referred her to [place] in 12/2017. I am not sure why the referral did not happen... I would like her to be seen at an academic center since unfortunately I cannot do much more for her at this point.  However, until she establishes care with them, I do agree to see her. We can schedule her this afternoon at 4 PM or 3 PM, whichever she prefers.

## 2018-04-19 NOTE — Telephone Encounter (Signed)
Called pt and left detailed voicemail stating that Dr. Cruzita Lederer cold see her today at the times listed below, as well as to call back to discuss the other information. Currently waiting on pt's call back.

## 2018-04-19 NOTE — Telephone Encounter (Signed)
Dr. Cruzita Lederer, I see your previous message now where you stated that you were unable to help the patient further. I will call the patient and notify her of this.

## 2018-04-23 NOTE — Telephone Encounter (Signed)
Patient called back and was scheduled to see Dr. Cruzita Lederer on 04/25/2018 at 1015. Pt was also made aware that Dr. Cruzita Lederer would prefer her to see an endocrinologist at an academic center, and this is why she was initially referred to [facility]. Pt verbalized understanding of this. Pt was also given the name and telephone number of [facility] so she can call and inquire about scheduling an appointment with Dr. Edmonia James, who she was referred to by Dr. Cruzita Lederer.

## 2018-04-25 ENCOUNTER — Encounter: Payer: Self-pay | Admitting: Internal Medicine

## 2018-04-25 ENCOUNTER — Ambulatory Visit (INDEPENDENT_AMBULATORY_CARE_PROVIDER_SITE_OTHER): Payer: Self-pay | Admitting: Internal Medicine

## 2018-04-25 DIAGNOSIS — E890 Postprocedural hypothyroidism: Secondary | ICD-10-CM

## 2018-04-25 DIAGNOSIS — E042 Nontoxic multinodular goiter: Secondary | ICD-10-CM

## 2018-04-25 LAB — T4, FREE: Free T4: 0.93 ng/dL (ref 0.60–1.60)

## 2018-04-25 LAB — TSH: TSH: 3.98 u[IU]/mL (ref 0.35–4.50)

## 2018-04-25 LAB — VITAMIN D 25 HYDROXY (VIT D DEFICIENCY, FRACTURES): VITD: 28.62 ng/mL — AB (ref 30.00–100.00)

## 2018-04-25 LAB — PHOSPHORUS: Phosphorus: 4.3 mg/dL (ref 2.3–4.6)

## 2018-04-25 NOTE — Patient Instructions (Signed)
Please continue current regimen.  I will refer you to [facility].  Please return to see me as needed.

## 2018-04-25 NOTE — Progress Notes (Signed)
Patient ID: Selena Scott, female   DOB: March 27, 1981, 38 y.o.   MRN: [id number]    HPI  Selena Scott is a 38 y.o.-year-old female, initially referred by her PCP, Levin Bacon, NP now returning for f/u for Nontoxic multinodular goiter, postsurgical hypocalcemia. Last visit 3.5 months ago.  She is here with her husband who offers part of the history, especially related to her symptoms and participating in discussing the plan of care.  She continues to feel dizzy, presyncopal, tired.  She also feels that she has poor coordination.  When she sits down she may have twitching of her muscles and cramping in her hands.  No perioral numbness.  She had an ED visit earlier this month for the symptoms and she was found to have a low calcium and high phosphorus.  At last visit I filled out an FMLA form for her to have time at work to recover from these episodes.  Reviewed history: Patient started to feel neck enlargement and pressure at the beginning of her pregnancy.  This persisted even after the birth of her baby in 12/2015.  At last visit, she was telling me that she was gagging on saliva, coughing, and snoring.  She saw her PCP and an ultrasound was obtained and showed several nodules.   As she was still complaining of significant neck compression symptoms at last visit, I referred her to surgery and she had total thyroidectomy 2 months ago.  Her neck compression symptoms resolved, however, she did develop postsurgical hypocalcemia. She was admitted few days after surgery.   She is on: - Calcitriol 0.25 mcg 5x >> 0.25 mcg x2 (0.5 mcg) 2x a day - Tums: 1 tab (1000 mg) 3x a day: 11 am, 5 pm, 10 pm - vitamin D 4000 units daily  Reviewed calcium levels: Lab Results  Component Value Date   CALCIUM 7.7 (L) 04/17/2018   CALCIUM 8.2 (L) 01/21/2018   CALCIUM 7.3 (L) 06/29/2017   CALCIUM 8.1 (L) 04/30/2017   CALCIUM 7.2 (L) 04/30/2017   CALCIUM 7.7 (L) 04/29/2017   CALCIUM 8.1 (L) 04/29/2017   CALCIUM 6.8  (L) 04/29/2017   CALCIUM 6.9 (L) 04/29/2017   CALCIUM 7.4 (L) 04/28/2017   CALCIUM 7.8 (L) 04/28/2017   CALCIUM 7.2 (L) 04/28/2017   CALCIUM 7.3 (L) 04/28/2017   CALCIUM 7.1 (L) 04/28/2017   CALCIUM 8.1 (L) 04/27/2017   Component     Latest Ref Rng & Units 04/30/2017 10/05/2017 10/24/2017 04/17/2018  Calcium, Ionized, Serum     4.5 - 5.6 mg/dL 4.5   4.1 (L)  Calcium Ionized     4.8 - 5.6 mg/dL  3.9 (L) 4.69 (L)    06/12/2017: Ca 6.7, PTH 14  (we called [facility] during the visit to obtain these levels) - she was off all her calcium supplements for a few days before these labs were checked 04/28/2017: Ionized calcium level low normal, at 4.5 (4.5-5.6)  Pertinent labs reviewed: Lab Results  Component Value Date   VD25OH 36.6 10/05/2017   VD25OH 15.24 (L) 06/27/2017   VD25OH 14.92 (L) 06/27/2017   Lab Results  Component Value Date   PHOS 5.5 (H) 04/17/2018   PHOS 4.6 11/15/2017   PHOS 4.5 10/24/2017   PHOS 4.9 (H) 06/27/2017   PHOS 4.6 06/27/2017   Lab Results  Component Value Date   MG 1.8 04/17/2018   MG 1.6 06/27/2017   MG 1.6 06/27/2017   MG 2.1 04/27/2017   MG 1.5 (L) 04/27/2017  MG 1.8 04/26/2017   MG 1.4 (L) 04/26/2017   Pt is on levothyroxine 88 mcg daily, taken: - in am - fasting - at least 30 min from b'fast - no Fe, PPIs - + Calcium and multivitamins more than 4 hours later - not on Biotin  Reviewed her study reports: Thyroid ultrasound (10/04/2016): Thyroid was markedly heterogenous, with several nodules: Right lobe: 1. Right mid nodule 2.1 x 2.0 x 1.2 cm, solid, isoechoic 2. Right mid nodule 1.9 x 1.4 x 1.2 cm, solid, isoechoic 3. Right inferior nodule 2.4 x 2.3 x 2.0 cm, solid, isoechoic Left lobe: 1. Left mid nodule 2.4 x 1.7 x 2.1 cm, solid, hypoechoic-indication for biopsy 2. Left superior nodule 5.1 x 3.2 x 4.2 cm, solid, isoechoic-indication for biopsy 3. Left inferior nodule 1.8 x 1.5 x 1.4 cm, solid, isoechoic  12/28/2016: FNA of the dominant  nodules: - Left mid nodule >> benign.  - Left upper nodule >> inconclusive due to insufficient sample  04/19/2017: Total thyroidectomy Thyroid, thyroidectomy, total - NODULAR HYPERPLASIA - BENIGN PARATHYROID TISSUE - NO MALIGNANCY IDENTIFIED  Reviewed patient's TFTs: Lab Results  Component Value Date   TSH 4.22 10/24/2017   TSH 0.67 12/06/2016   FREET4 0.83 10/24/2017   FREET4 0.75 12/06/2016  - per Dr. Cordelia Pen note: TSH 0.44  Thyroid antibodies were negative: Thyroperoxidase Ab SerPl-aCnc 12/06/2016 <1  <9 IU/mL  Thyroglobulin Ab 12/06/2016 <1  < or = 1 IU/mL   Pt denies: - feeling nodules in neck - hoarseness - dysphagia - choking - SOB with lying down  No FH of thyroid ds.No FH of thyroid cancer. No FH of thyroid cancer. No h/o radiation tx to head or neck.  No herbal supplements. No Biotin use. No recent steroids use.   ROS: Constitutional: no weight gain/no weight loss, + fatigue, no subjective hyperthermia, no subjective hypothermia Eyes: no blurry vision, no xerophthalmia ENT: + Sore throat, + see HPI Cardiovascular: no CP/no SOB/no palpitations/no leg swelling Respiratory: + Cough/no SOB/no wheezing Gastrointestinal: no N/no V/no D/no C/no acid reflux Musculoskeletal: + Muscle aches/no joint aches Skin: no rashes, no hair loss Neurological: no tremors/no numbness/no tingling/no dizziness  I reviewed pt's medications, allergies, PMH, social hx, family hx, and changes were documented in the history of present illness. Otherwise, unchanged from my initial visit note.  Since last visit, she stopped albuterol.  Past Medical History:  Diagnosis Date  . Anemia   . Fibroid   . GERD (gastroesophageal reflux disease)    hx of   . HPV (human papilloma virus) anogenital infection   . Medical history non-contributory   . Thyroid disease    Past Surgical History:  Procedure Laterality Date  . NO PAST SURGERIES    . THYROIDECTOMY N/A 04/19/2017   Procedure: TOTAL  THYROIDECTOMY;  Surgeon: Armandina Gemma, MD;  Location: [facility];  Service: General;  Laterality: N/A;   Social History   Social History  . Marital status: Married    Spouse name: N/A  . Number of children: 3   Occupational History  . HR supervisor   Social History Main Topics  . Smoking status: Never Smoker  . Smokeless tobacco: Never Used  . Alcohol use No  . Drug use: No   Current Outpatient Medications on File Prior to Visit  Medication Sig Dispense Refill  . albuterol (PROVENTIL HFA;VENTOLIN HFA) 108 (90 Base) MCG/ACT inhaler Inhale 2 puffs into the lungs every 4 (four) hours as needed for wheezing or shortness of breath.     Marland Kitchen  calcitRIOL (ROCALTROL) 0.25 MCG capsule Take 0.5 mcg by mouth 2 (two) times daily.    . calcitRIOL (ROCALTROL) 0.5 MCG capsule Take 1 capsule (0.5 mcg total) by mouth 2 (two) times daily. (Patient not taking: Reported on 04/17/2018) 180 capsule 3  . calcium carbonate (TUMS - DOSED IN MG ELEMENTAL CALCIUM) 500 MG chewable tablet Chew 1 tablet by mouth 3 (three) times daily.    . cholecalciferol (VITAMIN D3) 25 MCG (1000 UT) tablet Take 2,000 Units by mouth daily.    Marland Kitchen levothyroxine (SYNTHROID) 88 MCG tablet Take 1 tablet (88 mcg total) by mouth daily before breakfast. 90 tablet 3  . naproxen (NAPROSYN) 375 MG tablet Take 1 tablet (375 mg total) by mouth 2 (two) times daily. 30 tablet 0  . orphenadrine (NORFLEX) 100 MG tablet Take 1 tablet (100 mg total) by mouth 2 (two) times daily. 30 tablet 0   No current facility-administered medications on file prior to visit.    No Known Allergies Family History  Problem Relation Age of Onset  . Diabetes type II Mother   . Breast cancer Mother   . Hypertension Mother   . Thyroid disease Neg Hx    PE: BP 108/68   Pulse 78   Ht 5\' 3"  (1.6 m)   Wt 156 lb (70.8 kg)   LMP 04/03/2018   SpO2 99%   BMI 27.63 kg/m  Wt Readings from Last 3 Encounters:  04/25/18 156 lb (70.8 kg)  01/24/18 160 lb (72.6 kg)  10/09/17  155 lb 12.8 oz (70.7 kg)   Constitutional: overweight, in NAD Eyes: PERRLA, EOMI, no exophthalmos ENT: moist mucous membranes, no neck masses palpable, no cervical lymphadenopathy Cardiovascular: RRR, No MRG Respiratory: CTA B Gastrointestinal: abdomen soft, NT, ND, BS+ Musculoskeletal: no deformities, strength intact in all 4 Skin: moist, warm, no rashes Neurological: no tremor with outstretched hands, DTR normal in all 4, + [place] sign negative B  ASSESSMENT: 1. Postsurgical hypothyroidism  2. H/o MNG  Adequacy Reason Satisfactory For Evaluation. Diagnosis THYROID, FINE NEEDLE ASPIRATION, LEFT LOBE, LMP (SPECIMEN 1 OF 2 COLLECTED 12-28-2016) CONSISTENT WITH BENIGN FOLLICULAR NODULE (BETHESDA CATEGORY II). Enid Cutter MD Pathologist, Electronic Signature (Case signed 12/29/2016) Specimen Clinical Information Previous Biopsy: Today. Nodule 4 Left Mid 2.4 cm; Other 2 dimensions: 1.7 z 2.1 cm, Solid /almost completely solid, Hypoechoic, ACR TI-RADS total points: 4, Moderately suspicious nodule Source Thyroid, Fine Needle Aspiration, Lt Lobe LMP, (Specimen 1 of 2, collected on 12/28/16 )  Adequacy Reason Satisfactory But Limited For Evaluation, Scant Cellularity. Diagnosis THYROID, FINE NEEDLE ASPIRATION, LEFT LOBE LUP (SPECIMEN 2 OF 2 COLLECTED 05-30-91) SCANT FOLLICULAR EPITHELIUM PRESENT (BETHESDA CATEGORY I). Enid Cutter MD Pathologist, Electronic Signature (Case signed 12/29/2016) Specimen Clinical Information Previous Biopsy: Today, Nodule 5 Left Superior 5.1 cm; Other 2 dimensions: 3.2 x 4.2cm, Solid / almost completely solid, Isoechoic, ACR TI-RADS total points: 3, Mildly suspicious nodule Source Thyroid, Fine Needle Aspiration, Lt Lobe LUP, (Specimen 2 of 2,collected on 12/28/16 )  3. Postsurgical hypocalcemia  PLAN: 1. Postsurgical hypothyroidism  - latest thyroid labs reviewed with pt >> normal at last visit - she continues on LT4 88 mcg daily - pt feels  good on this dose. - we discussed about taking the thyroid hormone every day, with water, >30 minutes before breakfast, separated by >4 hours from acid reflux medications, calcium, iron, multivitamins. Pt. is taking it correctly. - will check thyroid tests today: TSH and fT4 - If labs are abnormal, she will need to  return for repeat TFTs in 1.5 months  2. H/o MNG Patient with history of several thyroid nodules, of which 2 were concerning and qualified for biopsy.  1 of the biopsies was benign, however, the largest nodule, 5.1 cm had an inconclusive biopsy due to lack of enough sample.  We discussed that usually we recommend a repeat biopsy for inconclusive nodules.   -She was having significant neck compression symptoms and I referred her to surgery.  She had total thyroidectomy by Dr. Harlow Asa on 04/19/2017.  Her neck compression symptoms resolved, but unfortunately, she developed postsurgical hypocalcemia.   2. Postsurgical hypocalcemia -Uncontrolled -She was recently seen in the emergency room on 04/17/2017.  At that time, she had a low ionized calcium of 4.1 (4.5-5.6), and a slightly high phosphorus of 5.5.  -Patient's calcium started to decrease after her surgery and she was admitted with a low calcium, of 6.6 last year.  She was given IV calcium gluconate and sent home on high-dose calcium carbonate (4 tablets 4 times a day) and also calcitriol 0.5 mg twice a day.  At discharge, her calcium increased to 7.2.  Her PTH was found to be suppressed.  Of note, vitamin D level was normal, on 4000 units vitamin D daily and 0.5 mg calcitriol twice a day).  Magnesium level was also normal. -At last visit, in 12/2017, we discussed that we could add a low-dose HCTZ to hopefully help her increase her serum calcium, and we also discussed about Natpara (At today's visit we also discussed about the fact that Marcina Millard is now out of the market, being recalled for contaminants).  She did not want to pursue these options  and she wanted me to refer her for a second opinion outside the practice.  I did refer her to Dr. Edmonia James at [facility].  However, she did not have this appointment as her husband lost his job and she did not have insurance at that time.  She call me after her ED visit from earlier this month to schedule another appointment with me.  She is aware that there are no other options that I can offer her.  Options for now is to increase calcium (we may need to do this after the results of today's labs are back), start HCTZ (she refuses, and I concur since she has a soft blood pressure and she already has dizziness and muscle cramps), await re--release on the market of Natpara, or off label use of teriparatide or abaloparatide.  I explained that these may be options that a bone specialist may have.  She agrees with a referral to a tertiary center, but would prefer [facility].  I will place the referral. -I will see her back as needed.   Component     Latest Ref Rng & Units 04/25/2018          TSH     0.35 - 4.50 uIU/mL 3.98  T4,Free(Direct)     0.60 - 1.60 ng/dL 0.93  Calcium Ionized     4.8 - 5.6 mg/dL 3.68 (L)  VITD     30.00 - 100.00 ng/mL 28.62 (L)  Phosphorus     2.3 - 4.6 mg/dL 4.3   TFTs are normal.  Ionized calcium is quite low and vitamin D is also slightly low.  Phosphorus is now normal. We can continue with the same dose of calcitriol, but I will advised her to increase the dose of calcium and vitamin D as follows: - Calcitriol 0.25 mcg  x2 (0.5 mcg) 2x a day - Tums: 1 tab (1000 mg) 3x a day: 11 am, 5 pm, 10 pm >> 2000 mg 3x a day - vitamin D 4000 units daily >> 6000 units daily  We will need to repeat her calcium level in 2 weeks.  Philemon Kingdom, MD PhD [facility]

## 2018-04-26 LAB — CALCIUM, IONIZED: Calcium, Ion: 3.68 mg/dL — ABNORMAL LOW (ref 4.8–5.6)

## 2019-02-19 ENCOUNTER — Other Ambulatory Visit: Payer: Self-pay | Admitting: Gynecology

## 2019-02-19 DIAGNOSIS — N644 Mastodynia: Secondary | ICD-10-CM

## 2019-02-22 ENCOUNTER — Other Ambulatory Visit: Payer: Self-pay | Admitting: Internal Medicine

## 2019-02-27 ENCOUNTER — Other Ambulatory Visit: Payer: Self-pay

## 2019-02-27 ENCOUNTER — Ambulatory Visit
Admission: RE | Admit: 2019-02-27 | Discharge: 2019-02-27 | Disposition: A | Discharge status: Home / Self Care | Payer: 59 | Source: Ambulatory Visit | Attending: Gynecology | Admitting: Gynecology

## 2019-02-27 DIAGNOSIS — N644 Mastodynia: Secondary | ICD-10-CM

## 2019-06-04 ENCOUNTER — Other Ambulatory Visit: Payer: Self-pay | Admitting: Internal Medicine

## 2019-06-04 NOTE — Telephone Encounter (Signed)
Last office visit 04/25/2018  Cancel/No-show? no  Future office visit scheduled? no  Please advise on refill.

## 2019-06-04 NOTE — Telephone Encounter (Signed)
Now seen by another endo, Dr. Denton Lank

## 2019-07-26 IMAGING — CT CT NECK W/ CM
3 of 5 series · 12 of 35 positions shown, 14 images · IV contrast (omnipaque)
Comparison: None.

CLINICAL DATA: Stiff and swollen neck for 3 weeks

EXAM:
CT NECK WITH CONTRAST
TECHNIQUE: Multidetector CT imaging of the neck was performed using the
standard protocol following the bolus administration of intravenous
contrast.
CONTRAST:  75mL OMNIPAQUE IOHEXOL 300 MG/ML  SOLN

[Series 6: orthogonal ax · axial · 0.39mm/px · z∈[-395,-208]mm · 4 of 148 slices shown, 5 images]
[im 25/148  soft-tissue]
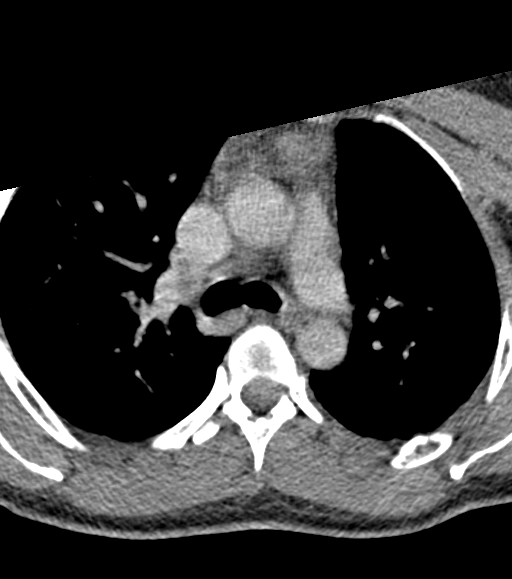
[im 25/148  bone]
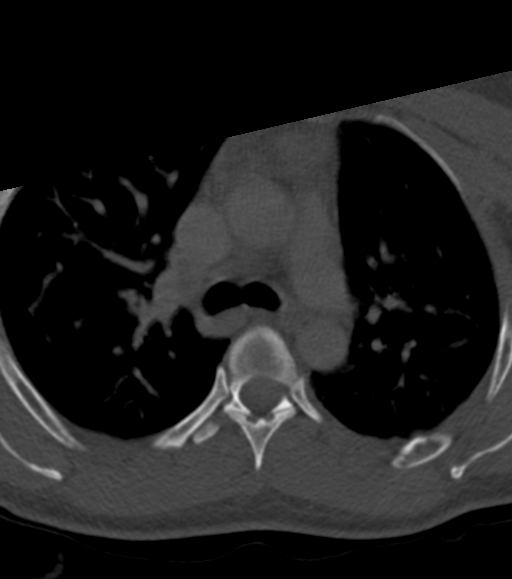
[im 50/148  bone]
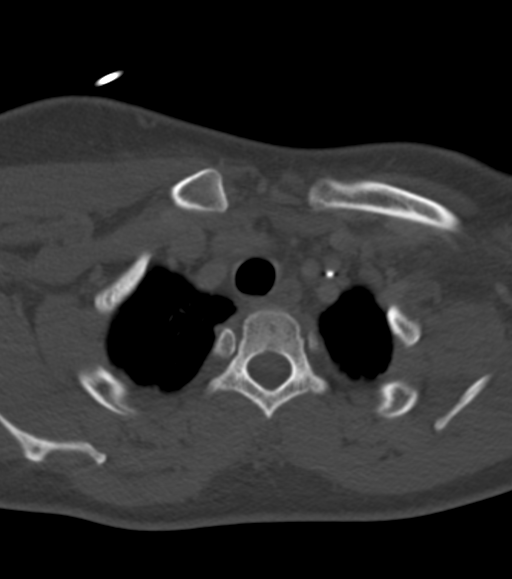
[im 99/148  bone]
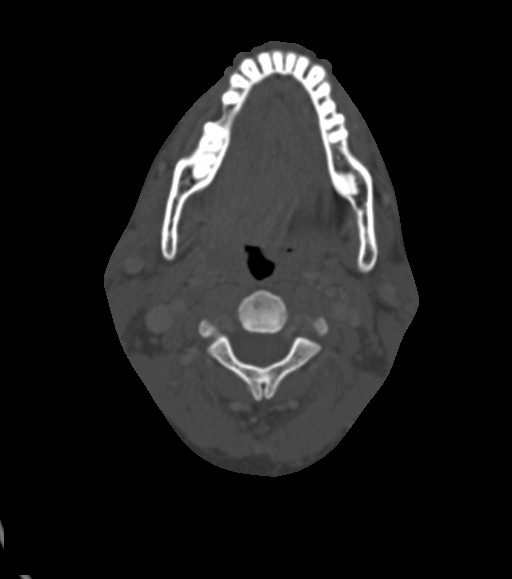
[im 123/148  bone]
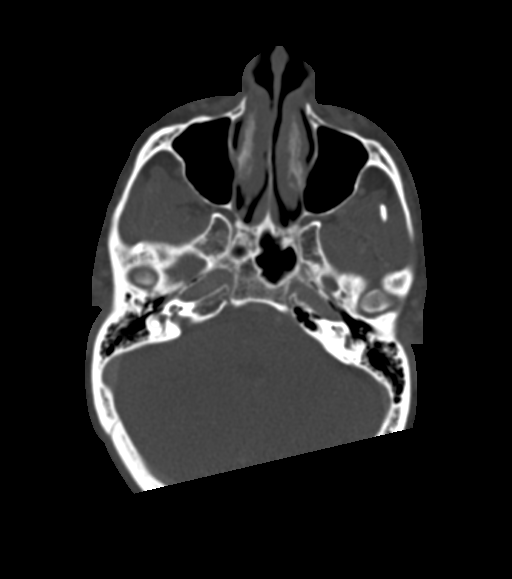

[Series 7: cor neck · coronal · 0.39mm/px · 3 of 114 slices shown]
[im 32/114  bone]
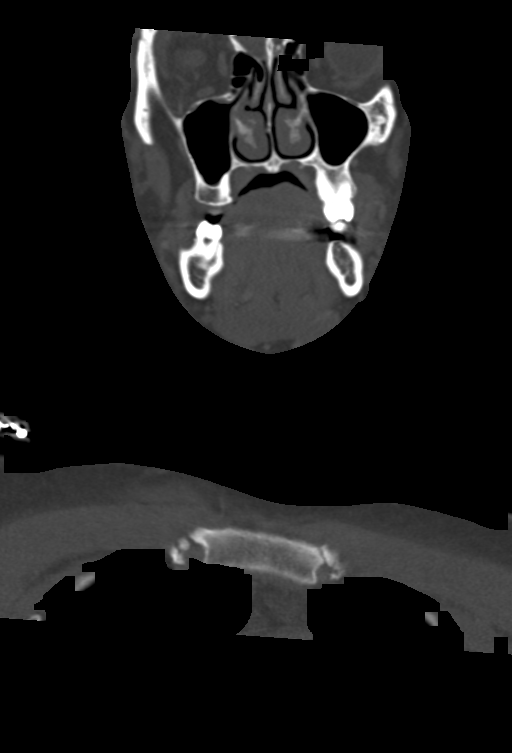
[im 49/114  bone]
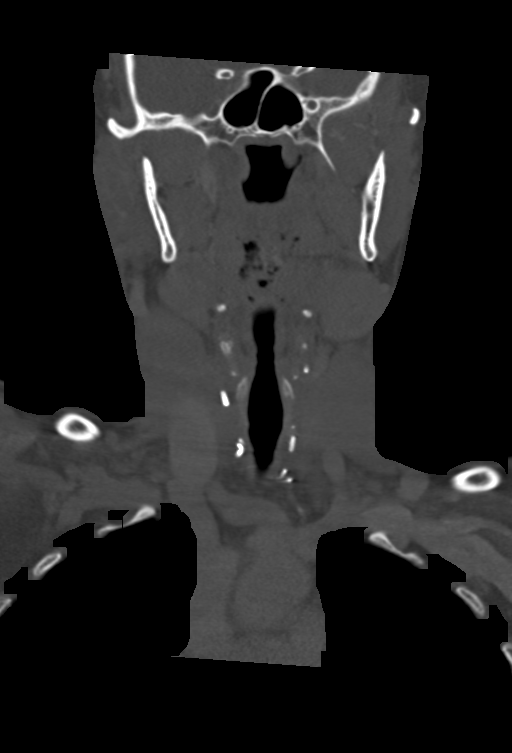
[im 66/114  bone]
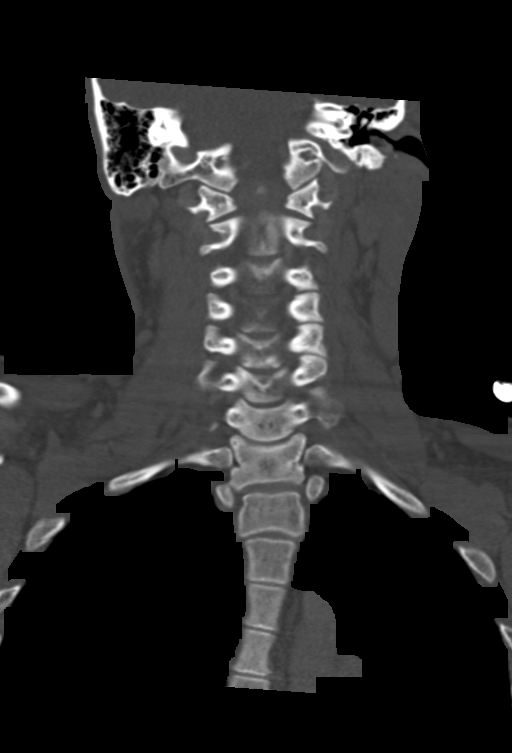

[Series 8: sag neck · sagittal · 0.44mm/px · 5 of 101 slices shown, 6 images]
[im 34/101  bone]
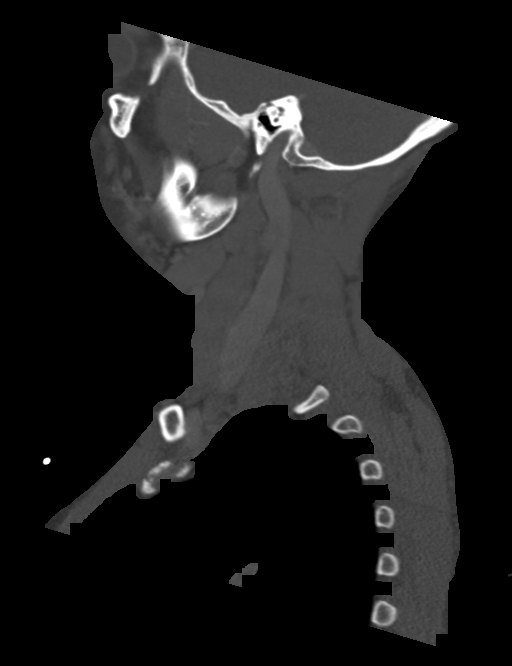
[im 42/101  bone]
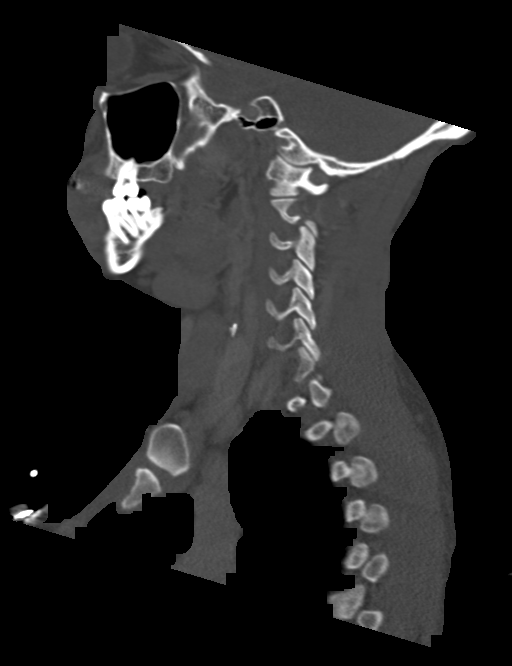
[im 51/101  soft-tissue]
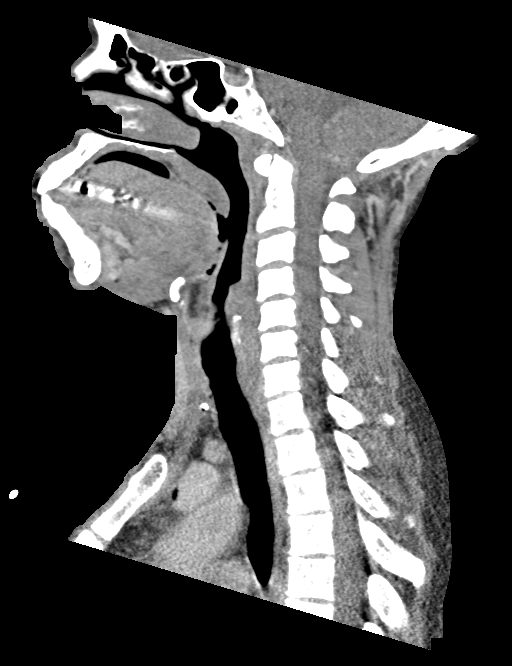
[im 51/101  bone]
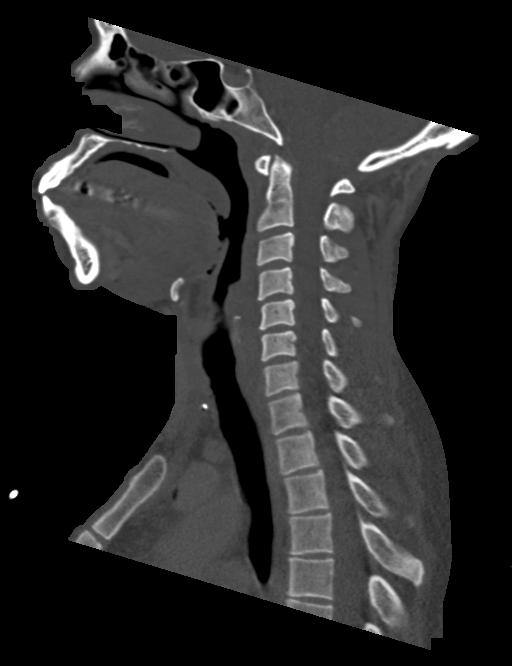
[im 59/101  bone]
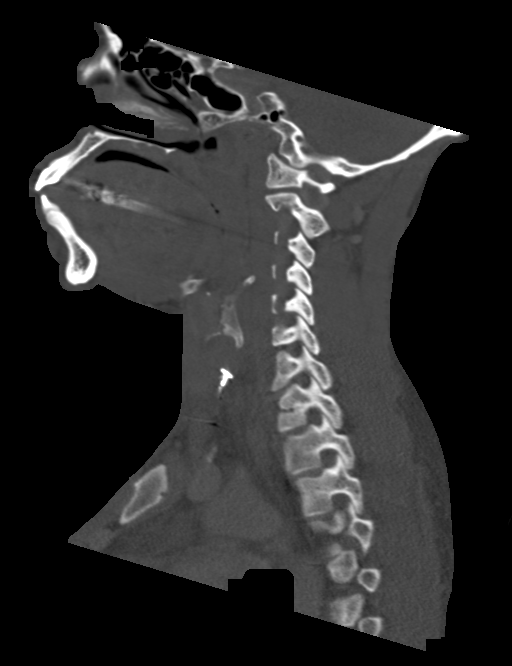
[im 67/101  bone]
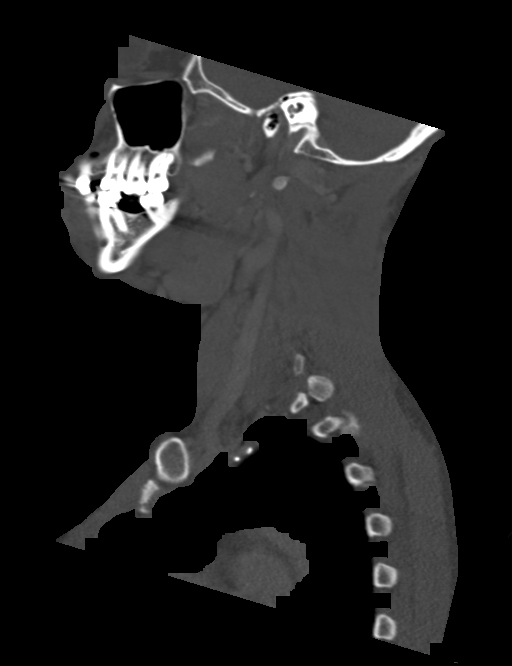

[12 of 35 positions shown; findings below may reference images not displayed]

FINDINGS: PHARYNX AND LARYNX:

--Nasopharynx: Fossae of Then are clear. Normal adenoid
tonsils for age.

--Oral cavity and oropharynx: The palatine and lingual tonsils are
normal. The visible oral cavity and floor of mouth are normal.

--Hypopharynx: Normal vallecula and pyriform sinuses.

--Larynx: Normal epiglottis and pre-epiglottic space. Normal
aryepiglottic and vocal folds.

--Retropharyngeal space: No abscess, effusion or lymphadenopathy.

SALIVARY GLANDS:

--Parotid: No mass lesion or inflammation. No sialolithiasis or
ductal dilatation.

--Submandibular: Symmetric without inflammation. No sialolithiasis
or ductal dilatation.

--Sublingual: Normal. No ranula or other visible lesion of the base
of tongue and floor of mouth.

THYROID: Status post thyroidectomy.

LYMPH NODES: Bilateral subcentimeter cervical lymph nodes but no
enlarged or abnormal density lymph nodes.

VASCULAR: Major cervical vessels are patent.

LIMITED INTRACRANIAL: Normal.

VISUALIZED ORBITS: Normal.

MASTOIDS AND VISUALIZED PARANASAL SINUSES: No fluid levels or
advanced mucosal thickening. No mastoid effusion.

SKELETON: No bony spinal canal stenosis. No lytic or blastic
lesions.

UPPER CHEST: Clear.

OTHER: None.
IMPRESSION: No acute abnormality of the neck.

## 2019-11-27 ENCOUNTER — Other Ambulatory Visit: Payer: Self-pay | Admitting: Internal Medicine

## 2020-11-26 ENCOUNTER — Other Ambulatory Visit: Payer: Self-pay

## 2020-11-26 ENCOUNTER — Ambulatory Visit
Admission: EM | Admit: 2020-11-26 | Discharge: 2020-11-26 | Disposition: A | Discharge status: Home / Self Care | Payer: No Typology Code available for payment source | Attending: Urgent Care | Admitting: Urgent Care

## 2020-11-26 DIAGNOSIS — U071 COVID-19: Secondary | ICD-10-CM | POA: Diagnosis not present

## 2020-11-26 DIAGNOSIS — R52 Pain, unspecified: Secondary | ICD-10-CM

## 2020-11-26 DIAGNOSIS — Z8616 Personal history of COVID-19: Secondary | ICD-10-CM

## 2020-11-26 DIAGNOSIS — R6883 Chills (without fever): Secondary | ICD-10-CM

## 2020-11-26 DIAGNOSIS — Z20822 Contact with and (suspected) exposure to covid-19: Secondary | ICD-10-CM

## 2020-11-26 DIAGNOSIS — R509 Fever, unspecified: Secondary | ICD-10-CM

## 2020-11-26 MED ORDER — MOLNUPIRAVIR EUA 200MG CAPSULE: 4.0000 | ORAL_CAPSULE | Freq: Two times a day (BID) | ORAL | 0 refills | Status: AC

## 2020-11-26 MED ORDER — ACETAMINOPHEN 325 MG PO TABS
650.0000 mg | ORAL_TABLET | Freq: Once | ORAL | Status: AC
  Administered 2020-11-26: 650 mg via ORAL

## 2020-11-26 NOTE — ED Triage Notes (Signed)
Pt c/o sore throat, headache, body aches and chills, nausea, fever onset yesterday. States son has COVID(+). Denies vomiting, diarrhea or constipation.

## 2020-11-26 NOTE — ED Provider Notes (Signed)
[place]   MRN: [id number] DOB: 1980/10/22  Subjective:   Selena Scott is a 40 y.o. female presenting for 1 day history of acute onset throat pain, headaches, body aches, chills, nausea, fever.  Patient has had close exposure to COVID-19 through her son.  She last had COVID-19 in January and states that this feels the same.  She has not had COVID vaccination.  Denies active cough, chest pain, shortness of breath.  She would like COVID antivirals.  Unfortunately she has not had a creatinine level in the past 3 months.  She does have a history of GERD, thyroid disease and anemia.  No current facility-administered medications for this encounter.  Current Outpatient Medications:    calcitRIOL (ROCALTROL) 0.25 MCG capsule, Take 0.5 mcg by mouth 2 (two) times daily., Disp: , Rfl:    calcitRIOL (ROCALTROL) 0.5 MCG capsule, Take 1 capsule (0.5 mcg total) by mouth 2 (two) times daily. (Patient not taking: Reported on 04/25/2018), Disp: 180 capsule, Rfl: 3   calcium carbonate (TUMS - DOSED IN MG ELEMENTAL CALCIUM) 500 MG chewable tablet, Chew 1 tablet by mouth 3 (three) times daily., Disp: , Rfl:    cholecalciferol (VITAMIN D3) 25 MCG (1000 UT) tablet, Take 2,000 Units by mouth daily., Disp: , Rfl:    levothyroxine (SYNTHROID) 88 MCG tablet, TAKE 1 TABLET BY MOUTH ONCE DAILY BEFORE BREAKFAST, Disp: 90 tablet, Rfl: 0   naproxen (NAPROSYN) 375 MG tablet, Take 1 tablet (375 mg total) by mouth 2 (two) times daily., Disp: 30 tablet, Rfl: 0   orphenadrine (NORFLEX) 100 MG tablet, Take 1 tablet (100 mg total) by mouth 2 (two) times daily., Disp: 30 tablet, Rfl: 0   No Known Allergies  Past Medical History:  Diagnosis Date   Anemia    Fibroid    GERD (gastroesophageal reflux disease)    hx of    HPV (human papilloma virus) anogenital infection    Medical history non-contributory    Thyroid disease      Past Surgical History:  Procedure Laterality Date   NO PAST SURGERIES      THYROIDECTOMY N/A 04/19/2017   Procedure: TOTAL THYROIDECTOMY;  Surgeon: Armandina Gemma, MD;  Location: [facility];  Service: General;  Laterality: N/A;    Family History  Problem Relation Age of Onset   Diabetes type II Mother    Breast cancer Mother    Hypertension Mother    Thyroid disease Neg Hx     Social History   Tobacco Use   Smoking status: Never   Smokeless tobacco: Never  Vaping Use   Vaping Use: Never used  Substance Use Topics   Alcohol use: No   Drug use: No    ROS   Objective:   Vitals: BP 120/75 (BP Location: Left Arm)   Pulse 92   Temp (!) 100.4 F (38 C) (Oral)   Resp 18   SpO2 98%   Physical Exam Constitutional:      General: She is not in acute distress.    Appearance: Normal appearance. She is well-developed. She is not ill-appearing, toxic-appearing or diaphoretic.  HENT:     Head: Normocephalic and atraumatic.     Right Ear: External ear normal.     Left Ear: External ear normal.     Nose: Nose normal.     Mouth/Throat:     Mouth: Mucous membranes are moist.  Eyes:     General: No scleral icterus.       Right eye:  No discharge.        Left eye: No discharge.     Extraocular Movements: Extraocular movements intact.     Conjunctiva/sclera: Conjunctivae normal.     Pupils: Pupils are equal, round, and reactive to light.  Cardiovascular:     Rate and Rhythm: Normal rate and regular rhythm.     Pulses: Normal pulses.     Heart sounds: Normal heart sounds. No murmur heard.   No friction rub. No gallop.  Pulmonary:     Effort: Pulmonary effort is normal. No respiratory distress.     Breath sounds: Normal breath sounds. No stridor. No wheezing, rhonchi or rales.  Skin:    General: Skin is warm and dry.     Findings: No rash.  Neurological:     Mental Status: She is alert and oriented to person, place, and time.     Cranial Nerves: No cranial nerve deficit.     Motor: No weakness.     Coordination: Coordination normal.     Gait: Gait  normal.     Deep Tendon Reflexes: Reflexes normal.  Psychiatric:        Mood and Affect: Mood normal.        Behavior: Behavior normal.        Thought Content: Thought content normal.        Judgment: Judgment normal.     Assessment and Plan :   PDMP not reviewed this encounter.  1. Clinical diagnosis of COVID-19   2. Fever, unspecified   3. Close exposure to COVID-19 virus   4. Body aches   5. Chills   6. History of COVID-19     Given the timeframe of getting results, we discussed using molnupiravir instead of Paxlovid.  Recommended supportive care otherwise.  High suspicion for COVID-19 given her symptoms, close exposure.  Testing pending. Counseled patient on potential for adverse effects with medications prescribed/recommended today, ER and return-to-clinic precautions discussed, patient verbalized understanding.    Jaynee Eagles, [place] 11/26/20 [id number]

## 2020-11-26 NOTE — Discharge Instructions (Addendum)

## 2020-11-27 LAB — COVID-19, FLU A+B NAA
Influenza A, NAA: NOT DETECTED
Influenza B, NAA: NOT DETECTED
SARS-CoV-2, NAA: DETECTED — AB

## 2021-05-16 ENCOUNTER — Ambulatory Visit
Admission: EM | Admit: 2021-05-16 | Discharge: 2021-05-16 | Disposition: A | Discharge status: Home / Self Care | Payer: No Typology Code available for payment source | Attending: Internal Medicine | Admitting: Internal Medicine

## 2021-05-16 ENCOUNTER — Other Ambulatory Visit: Payer: Self-pay

## 2021-05-16 ENCOUNTER — Emergency Department (HOSPITAL_COMMUNITY): Payer: No Typology Code available for payment source

## 2021-05-16 ENCOUNTER — Encounter: Payer: Self-pay | Admitting: Emergency Medicine

## 2021-05-16 ENCOUNTER — Emergency Department (HOSPITAL_COMMUNITY)
Admission: EM | Admit: 2021-05-16 | Discharge: 2021-05-16 | Disposition: A | Discharge status: Home / Self Care | Payer: No Typology Code available for payment source | Attending: Emergency Medicine | Admitting: Emergency Medicine

## 2021-05-16 ENCOUNTER — Encounter (HOSPITAL_COMMUNITY): Payer: Self-pay

## 2021-05-16 DIAGNOSIS — R0789 Other chest pain: Secondary | ICD-10-CM | POA: Insufficient documentation

## 2021-05-16 DIAGNOSIS — R0602 Shortness of breath: Secondary | ICD-10-CM | POA: Insufficient documentation

## 2021-05-16 LAB — BASIC METABOLIC PANEL
Anion gap: 8 (ref 5–15)
BUN: 14 mg/dL (ref 6–20)
CO2: 24 mmol/L (ref 22–32)
Calcium: 7.9 mg/dL — ABNORMAL LOW (ref 8.9–10.3)
Chloride: 105 mmol/L (ref 98–111)
Creatinine, Ser: 0.87 mg/dL (ref 0.44–1.00)
GFR, Estimated: >60 mL/min (ref >60)
Glucose, Bld: 98 mg/dL (ref 70–99)
Potassium: 3.7 mmol/L (ref 3.5–5.1)
Sodium: 137 mmol/L (ref 135–145)

## 2021-05-16 LAB — CBC
HCT: 35.5 % — ABNORMAL LOW (ref 36.0–46.0)
Hemoglobin: 11.7 g/dL — ABNORMAL LOW (ref 12.0–15.0)
MCH: 25.7 pg — ABNORMAL LOW (ref 26.0–34.0)
MCHC: 33 g/dL (ref 30.0–36.0)
MCV: 77.9 fL — ABNORMAL LOW (ref 80.0–100.0)
Platelets: 329 10*3/uL (ref 150–400)
RBC: 4.56 MIL/uL (ref 3.87–5.11)
RDW: 15.7 % — ABNORMAL HIGH (ref 11.5–15.5)
WBC: 5.3 10*3/uL (ref 4.0–10.5)
nRBC: 0 % (ref 0.0–0.2)

## 2021-05-16 LAB — I-STAT BETA HCG BLOOD, ED (MC, WL, AP ONLY): I-stat hCG, quantitative: <5 m[IU]/mL (ref <5)

## 2021-05-16 LAB — TROPONIN I (HIGH SENSITIVITY)
Troponin I (High Sensitivity): <2 ng/L (ref <18)
Troponin I (High Sensitivity): <2 ng/L (ref <18)

## 2021-05-16 MED ORDER — OXYCODONE-ACETAMINOPHEN 5-325 MG PO TABS: 1.0000 | ORAL_TABLET | Freq: Four times a day (QID) | ORAL | 0 refills | Status: DC | PRN

## 2021-05-16 MED ORDER — METHOCARBAMOL 500 MG PO TABS
500.0000 mg | ORAL_TABLET | Freq: Once | ORAL | Status: AC
  Administered 2021-05-16: 500 mg via ORAL
  Filled 2021-05-16: qty 1

## 2021-05-16 MED ORDER — OXYCODONE-ACETAMINOPHEN 5-325 MG PO TABS
1.0000 | ORAL_TABLET | Freq: Once | ORAL | Status: AC
  Administered 2021-05-16: 1 via ORAL
  Filled 2021-05-16: qty 1

## 2021-05-16 NOTE — ED Triage Notes (Signed)
Patient c/o intermittent left chest pain x 1 week. Patient states 'today the pain was in the right chest and right shoulder and was unrelieved by gas pills." Patient states dizziness and slight SOB when she went to [employer] to get ginger ale.

## 2021-05-16 NOTE — ED Provider Triage Note (Signed)
Emergency Medicine Provider Triage Evaluation Note  Selena Scott , a 41 y.o. female  was evaluated in triage.  Pt complains of intermittent left chest pain x1.5 weeks.  Denies recent heavy lifting, injury, trauma.  She does note that her child has been laying on her chest recently.  She notes now that her chest pain is localized to the right side and sternal region.  Has associated shortness of breath.  Has tried Gas-X pills with no relief.  Denies abdominal pain, nausea, vomiting.  Denies past medical history of asthma, COPD, GERD, MI, cardiac catheterization, stents.  Review of Systems  Positive: As per HPI above Negative:   Physical Exam  BP 102/64 (BP Location: Left Arm)    Pulse 72    Temp 98.3 F (36.8 C) (Oral)    Resp 18    Ht 5\' 3"  (1.6 m)    Wt 72.6 kg    LMP 05/10/2021    SpO2 100%    BMI 28.34 kg/m  Gen:   Awake, uncomfortable appearing Resp:  Normal effort  MSK:   Moves extremities without difficulty  Other:  Right chest wall tenderness to palpation.  Tenderness to palpation noted to the sternal region.  No overlying skin changes.  Medical Decision Making  Medically screening exam initiated at 12:34 PM.  Appropriate orders placed.  Patriciann Gascoigne was informed that the remainder of the evaluation will be completed by another provider, this initial triage assessment does not replace that evaluation, and the importance of remaining in the ED until their evaluation is complete.  12:46 PM - Discussed with RN that patient is in need of a room. RN aware and working on room placement.    Raylyn Carton A, PA-C 05/16/21 1247

## 2021-05-16 NOTE — ED Notes (Signed)
Pt refused recheck vitals

## 2021-05-16 NOTE — ED Provider Notes (Signed)
[facility] DEPT Provider Note   CSN: [id number] Arrival date & time: 05/16/21  1151     History  Chief Complaint  Patient presents with   Chest Pain    Selena Scott is a 41 y.o. female who presents to the ED complaining of intermittent left chest pain x1.5 weeks.  Denies recent heavy lifting, injury, trauma.  She does note that her child has been laying on her chest recently.  She notes now that her chest pain is localized to the right side and sternal region.  Has associated shortness of breath.  Has tried Gas-X pills with no relief.  Denies abdominal pain, nausea, vomiting.  Denies past medical history of asthma, COPD, GERD, MI, cardiac catheterization, stents. Denies PMHx of DVT/PE, recent immobilization/surgery, OCP, HRT, or anticoagulant use.   The history is provided by the patient and the spouse. No language interpreter was used.      Home Medications Prior to Admission medications   Medication Sig Start Date End Date Taking? Authorizing Provider  oxyCODONE-acetaminophen (PERCOCET/ROXICET) 5-325 MG tablet Take 1 tablet by mouth every 6 (six) hours as needed for severe pain. 05/16/21  Yes Alecea Trego A, PA-C  calcitRIOL (ROCALTROL) 0.25 MCG capsule Take 0.5 mcg by mouth 2 (two) times daily.    [provider]  calcitRIOL (ROCALTROL) 0.5 MCG capsule Take 1 capsule (0.5 mcg total) by mouth 2 (two) times daily. Patient not taking: Reported on 04/25/2018 01/24/18   Philemon Kingdom, MD  calcium carbonate (TUMS - DOSED IN MG ELEMENTAL CALCIUM) 500 MG chewable tablet Chew 1 tablet by mouth 3 (three) times daily.    [provider]  cholecalciferol (VITAMIN D3) 25 MCG (1000 UT) tablet Take 2,000 Units by mouth daily.    [provider]  levothyroxine (SYNTHROID) 88 MCG tablet TAKE 1 TABLET BY MOUTH ONCE DAILY BEFORE BREAKFAST 02/24/19   Philemon Kingdom, MD  naproxen (NAPROSYN) 375 MG tablet Take 1 tablet (375 mg total) by  mouth 2 (two) times daily. 04/17/18   Charlesetta Shanks, MD  orphenadrine (NORFLEX) 100 MG tablet Take 1 tablet (100 mg total) by mouth 2 (two) times daily. 04/17/18   Charlesetta Shanks, MD      Allergies    Patient has no known allergies.    Review of Systems   Review of Systems  Constitutional:  Negative for chills and fever.  Respiratory:  Positive for shortness of breath. Negative for cough.   Cardiovascular:  Positive for chest pain.  Gastrointestinal:  Negative for abdominal pain, nausea and vomiting.  All other systems reviewed and are negative.  Physical Exam Updated Vital Signs BP 106/82    Pulse 69    Temp 98.4 F (36.9 C) (Oral)    Resp 14    Ht 5\' 3"  (1.6 m)    Wt 72.6 kg    LMP 05/10/2021    SpO2 100%    BMI 28.34 kg/m  Physical Exam Vitals and nursing note reviewed.  Constitutional:      General: She is not in acute distress.    Appearance: She is not diaphoretic.  HENT:     Head: Normocephalic and atraumatic.     Mouth/Throat:     Pharynx: No oropharyngeal exudate.  Eyes:     General: No scleral icterus.    Conjunctiva/sclera: Conjunctivae normal.  Cardiovascular:     Rate and Rhythm: Normal rate and regular rhythm.     Pulses: Normal pulses.     Heart sounds: Normal  heart sounds.  Pulmonary:     Effort: Pulmonary effort is normal. No respiratory distress.     Breath sounds: Normal breath sounds. No wheezing.  Chest:     Chest wall: Tenderness present.     Comments: Right chest wall tenderness to palpation.  Tenderness to palpation noted to the sternal region.  No overlying skin changes. Abdominal:     General: Bowel sounds are normal.     Palpations: Abdomen is soft. There is no mass.     Tenderness: There is no abdominal tenderness. There is no guarding or rebound.  Musculoskeletal:        General: Normal range of motion.     Cervical back: Normal range of motion and neck supple.  Skin:    General: Skin is warm and dry.  Neurological:     Mental Status:  She is alert.  Psychiatric:        Behavior: Behavior normal.    ED Results / Procedures / Treatments   Labs (all labs ordered are listed, but only abnormal results are displayed) Labs Reviewed  BASIC METABOLIC PANEL - Abnormal; Notable for the following components:      Result Value   Calcium 7.9 (*)    All other components within normal limits  CBC - Abnormal; Notable for the following components:   Hemoglobin 11.7 (*)    HCT 35.5 (*)    MCV 77.9 (*)    MCH 25.7 (*)    RDW 15.7 (*)    All other components within normal limits  I-STAT BETA HCG BLOOD, ED ([facility], [facility], [facility] ONLY)  TROPONIN I (HIGH SENSITIVITY)  TROPONIN I (HIGH SENSITIVITY)    EKG EKG Interpretation  Date/Time:  Monday May 16 2021 12:12:17 EST Ventricular Rate:  72 PR Interval:  180 QRS Duration: 83 QT Interval:  388 QTC Calculation: 425 R Axis:   43 Text Interpretation: Sinus rhythm No significant change since last tracing Confirmed by Wandra Arthurs [id number]) on 05/16/2021 8:39:16 PM  Radiology DG Chest 2 View  Result Date: 05/16/2021 CLINICAL DATA:  Intermittent LEFT-sided chest pain. EXAM: CHEST - 2 VIEW COMPARISON:  Chest x-ray dated 04/25/2017. FINDINGS: Heart size and mediastinal contours are within normal limits. Lungs are clear. No pleural effusion or pneumothorax is seen. Osseous structures about the chest are unremarkable. IMPRESSION: No active cardiopulmonary disease. No evidence of pneumonia or pulmonary edema. Electronically Signed   By: Franki Cabot M.D.   On: 05/16/2021 13:42    Procedures Procedures    Medications Ordered in ED Medications  oxyCODONE-acetaminophen (PERCOCET/ROXICET) 5-325 MG per tablet 1 tablet (1 tablet Oral Given 05/16/21 1344)  methocarbamol (ROBAXIN) tablet 500 mg (500 mg Oral Given 05/16/21 2050)  oxyCODONE-acetaminophen (PERCOCET/ROXICET) 5-325 MG per tablet 1 tablet (1 tablet Oral Given 05/16/21 2144)    ED Course/ Medical Decision Making/ A&P Clinical Course as of  05/16/21 2157  Mon May 16, 2021  2044 Discussed treatment plan with patient and husband at bedside.  Agreeable at this time.  Patient notified that she took Tylenol approximately 10 minutes ago.  Patient noted relief with Percocet that was given during her medical screening exam. [SB]  2056 Calcium(!): 7.9 Stable overall from priors, pt takes calcitrol s/p thyroidectomy BID [SB]  2128 Discussion with pt at bedside and patient noted that her heart began to flutter with the Robaxin.  In light of this we will not prescribe Robaxin.  [SB]  2138 Discussed with patient and husband at bedside that it  is not the norm to send with narcotics due to chest pain.  Discussed that the narcotics are to be used for an as needed pain and she can use Tylenol or ibuprofen as needed.  Safe for discahrge [SB]  2148 Patient ambulated out of the ED. [SB]    Clinical Course User Index [SB] Rozella Servello A, PA-C                           Medical Decision Making Amount and/or Complexity of Data Reviewed Labs: ordered. Decision-making details documented in ED Course. Radiology: ordered.  Risk Prescription drug management.   Patient presents to the ED with intermittent chest pain x 1.5 weeks.  Patient notes that her chest pain is localized currently to her sternal and right side.  No prior history of MI, catheterization, DVT/PE.  Vital signs stable, patient not tachycardic or hypoxic, afebrile.  On exam patient with tenderness to palpation noted to right chest wall and sternal region.  Otherwise no acute cardiovascular, respiratory, abdominal exam findings.  Differential diagnosis includes ACS, pneumothorax, PE, pneumonia, COVID, flu, muscular etiology.   Labs:  I ordered, and personally interpreted labs.  The pertinent results include:  Both troponins less than 2. Data hCG negative. CBC unremarkable, hemoglobin 11.7 however stable from priors. BMP unremarkable, slightly decreased calcium at 7.9.  Patient takes  calcitriol.  Imaging: I ordered imaging studies including chest x-ray I independently visualized and interpreted imaging which showed no acute cardiopulmonary findings I agree with the radiologist interpretation  Medications:  I ordered medication including Percocet and Robaxin and hot pack for pain management Reevaluation of the patient after these medicines showed that the patient improved I have reviewed the patients home medicines and have made adjustments as needed  Reevaluation: After the interventions noted above, I reevaluated the patient and found that they have :improved  Cardiac Monitoring: The patient was maintained on a cardiac monitor.  I personally viewed and interpreted the cardiac monitored which showed an underlying rhythm of: Normal sinus rhythm at a rate of 68.   Test Considered: Considered CTA PE however patient without PE risk factors, PERC negative.  Not tachycardic on exam.   Disposition: Patient presentation suspicious for muscular etiology as cause of pain.  Doubt COVID, flu, pneumonia, pneumothorax, ACS at this time.  EKG without acute ST/T changes, troponins negative, chest x-ray negative, low suspicion for ACS at this time.  Chest x-ray without acute findings, vital signs stable, doubt pneumothorax or pneumonia at this time.  No risk factors for PE, no unilateral lower extremity swelling, HRT, OCP, recent immobilization, surgery, anticoagulant use, doubt pulmonary embolism at this time.  After consideration of the diagnostic results and the patients response to treatment, I feel that the patient would benefit from discharge home with close primary care follow-up.  Was initially going to send patient home with Robaxin prescription however after Robaxin was given patient noted that she felt her heart was throbbing even more and beating out of her chest.  In light of this we will defer treatment with prescription Robaxin.  Patient given additional Percocet prior to  discharge.  We will send 2 tablets of Percocet to patient's pharmacy and instructed patient to use for breakthrough pain.  In-depth conversation with patient and husband at bedside regarding narcotics and the utilization. Supportive care measures and strict return precautions discussed with patient at bedside. Pt acknowledges and verbalizes understanding. Pt appears safe for discharge. Follow up as  indicated in discharge paperwork.    This chart was dictated using voice recognition software, Dragon. Despite the best efforts of this provider to proofread and correct errors, errors may still occur which can change documentation meaning.  Final Clinical Impression(s) / ED Diagnoses Final diagnoses:  Chest wall pain    Rx / DC Orders ED Discharge Orders          Ordered    oxyCODONE-acetaminophen (PERCOCET/ROXICET) 5-325 MG tablet  Every 6 hours PRN        05/16/21 2139              Angelyne Terwilliger A, PA-C 05/16/21 2157    Drenda Freeze, MD 05/16/21 [phone]

## 2021-05-16 NOTE — ED Provider Notes (Signed)
[name]    CSN: [id number] Arrival date & time: 05/16/21  1041      History   Chief Complaint Chief Complaint  Patient presents with   Chest Pain    HPI Selena Scott is a 41 y.o. female.   Patient presents with chest pain that has been intermittent over the past week.  She reports that chest pain has worsened today.  Chest pain originally started on the left side but is now radiating to the right side and down right arm.  Patient became concerned today as she began having some associated shortness of breath and dizziness.  Denies any injury to the area.  Denies any history of heart or lung problems.  Denies headache, blurred vision, nausea, vomiting.  Patient has not taken any medications to help alleviate symptoms.  Pain is rated 8/10 on pain scale but patient is not able to characterize pain.   Chest Pain  Past Medical History:  Diagnosis Date   Anemia    Fibroid    GERD (gastroesophageal reflux disease)    hx of    HPV (human papilloma virus) anogenital infection    Medical history non-contributory    Thyroid disease     Patient Active Problem List   Diagnosis Date Noted   Postsurgical hypothyroidism 06/15/2017   Iatrogenic hypocalcemia 04/26/2017   Viral gastroenteritis 04/26/2017   Nausea vomiting and diarrhea    H/o Multinodular goiter 11/17/2016   Indication for care in labor or delivery 01/13/2016   SVD (spontaneous vaginal delivery) 01/13/2016   Advanced maternal age in multigravida 08/06/2015    Past Surgical History:  Procedure Laterality Date   NO PAST SURGERIES     THYROIDECTOMY N/A 04/19/2017   Procedure: TOTAL THYROIDECTOMY;  Surgeon: Armandina Gemma, MD;  Location: [facility];  Service: General;  Laterality: N/A;    OB History     Gravida  3   Para  3   Term  3   Preterm      AB      Living  3      SAB      IAB      Ectopic      Multiple  0   Live Births  3            Home Medications    Prior to  Admission medications   Medication Sig Start Date End Date Taking? Authorizing Provider  calcitRIOL (ROCALTROL) 0.25 MCG capsule Take 0.5 mcg by mouth 2 (two) times daily.    [provider]  calcitRIOL (ROCALTROL) 0.5 MCG capsule Take 1 capsule (0.5 mcg total) by mouth 2 (two) times daily. Patient not taking: Reported on 04/25/2018 01/24/18   Philemon Kingdom, MD  calcium carbonate (TUMS - DOSED IN MG ELEMENTAL CALCIUM) 500 MG chewable tablet Chew 1 tablet by mouth 3 (three) times daily.    [provider]  cholecalciferol (VITAMIN D3) 25 MCG (1000 UT) tablet Take 2,000 Units by mouth daily.    [provider]  levothyroxine (SYNTHROID) 88 MCG tablet TAKE 1 TABLET BY MOUTH ONCE DAILY BEFORE BREAKFAST 02/24/19   Philemon Kingdom, MD  naproxen (NAPROSYN) 375 MG tablet Take 1 tablet (375 mg total) by mouth 2 (two) times daily. 04/17/18   Charlesetta Shanks, MD  orphenadrine (NORFLEX) 100 MG tablet Take 1 tablet (100 mg total) by mouth 2 (two) times daily. 04/17/18   Charlesetta Shanks, MD    Family History Family History  Problem Relation Age of  Onset   Diabetes type II Mother    Breast cancer Mother    Hypertension Mother    Thyroid disease Neg Hx     Social History Social History   Tobacco Use   Smoking status: Never   Smokeless tobacco: Never  Vaping Use   Vaping Use: Never used  Substance Use Topics   Alcohol use: No   Drug use: No     Allergies   Patient has no known allergies.   Review of Systems Review of Systems Per HPI  Physical Exam Triage Vital Signs ED Triage Vitals  Enc Vitals Group     BP 05/16/21 1054 115/76     Pulse Rate 05/16/21 1054 76     Resp 05/16/21 1054 16     Temp 05/16/21 1054 98 F (36.7 C)     Temp Source 05/16/21 1054 Oral     SpO2 05/16/21 1054 97 %     Weight --      Height --      Head Circumference --      Peak Flow --      Pain Score 05/16/21 1055 8     Pain Loc --      Pain Edu? --      Excl. in [place]? --     No data found.  Updated Vital Signs BP 115/76 (BP Location: Left Arm)    Pulse 76    Temp 98 F (36.7 C) (Oral)    Resp 16    SpO2 97%   Visual Acuity Right Eye Distance:   Left Eye Distance:   Bilateral Distance:    Right Eye Near:   Left Eye Near:    Bilateral Near:     Physical Exam Constitutional:      General: She is in acute distress.     Appearance: Normal appearance. She is not toxic-appearing or diaphoretic.     Comments: Patient is rolling around in the exam chair due to pain.  HENT:     Head: Normocephalic and atraumatic.  Eyes:     Extraocular Movements: Extraocular movements intact.     Conjunctiva/sclera: Conjunctivae normal.     Pupils: Pupils are equal, round, and reactive to light.  Cardiovascular:     Rate and Rhythm: Normal rate and regular rhythm.     Pulses: Normal pulses.     Heart sounds: Normal heart sounds.  Pulmonary:     Effort: Pulmonary effort is normal. No respiratory distress.     Breath sounds: Normal breath sounds.  Neurological:     General: No focal deficit present.     Mental Status: She is alert and oriented to person, place, and time. Mental status is at baseline.     Cranial Nerves: Cranial nerves 2-12 are intact.     Sensory: Sensation is intact.     Motor: Motor function is intact.     Coordination: Coordination is intact.     Gait: Gait is intact.  Psychiatric:        Mood and Affect: Mood normal.        Behavior: Behavior normal.        Thought Content: Thought content normal.        Judgment: Judgment normal.     UC Treatments / Results  Labs (all labs ordered are listed, but only abnormal results are displayed) Labs Reviewed - No data to display  EKG   Radiology No results found.  Procedures Procedures (including critical care  time)  Medications Ordered in UC Medications - No data to display  Initial Impression / Assessment and Plan / UC Course  I have reviewed the triage vital signs and the nursing  notes.  Pertinent labs & imaging results that were available during my care of the patient were reviewed by me and considered in my medical decision making (see chart for details).     EKG was normal sinus rhythm.  I do suspect this could be musculoskeletal in nature but patient is inconsolable in exam chair due to severity of pain.  Therefore, I do think the patient needs a more extensive evaluation to rule out worrisome etiologies.  Patient was advised to go to the ER.  Suggested EMS transport due to chest pain being present but patient declined.  Her family member transported her to the hospital.  Risks associated with not going via EMS were discussed with patient.  Patient voiced understanding. Final Clinical Impressions(s) / UC Diagnoses   Final diagnoses:  Other chest pain     Discharge Instructions      Please go to the emergency department as soon as you leave urgent care for further evaluation and management.    ED Prescriptions   None    PDMP not reviewed this encounter.   Teodora Medici, [place] 05/16/21 1146

## 2021-05-16 NOTE — ED Triage Notes (Addendum)
Intermittent left sided chest pain over the last week. Woke up this morning with worsening chest pain radiating into the right side, felt light-headed. Throbbing pain, radiating from left to right chest into right shoulder. Hx of clotting in legs with surgery per patient, happened last year. Reports mild shortness of breath

## 2021-05-16 NOTE — Discharge Instructions (Addendum)
It was a pleasure taking care of you today!   Your workup was negative in the ED. You will be sent a prescription for Percocet. Do not drive or operate heavy machinery while on this medication. You may apply ice or heat to the affected area for 15 minutes at a time.  Ensure to place a barrier between your skin and and the ice.  You may use over-the-counter 500 mg Tylenol every 6 hours or 600 mg ibuprofen every 6 hours as needed for pain for no more than 7 days.  Follow-up with your primary care provider for evaluation of your symptoms. You may return to the ED if you are experiencing increasing/worsening chest pain, shortness of breath, or worsening symptoms.

## 2021-05-16 NOTE — Discharge Instructions (Signed)
Please go to the emergency department as soon as you leave urgent care for further evaluation and management. ?

## 2021-05-17 ENCOUNTER — Encounter: Payer: Self-pay | Admitting: Physician Assistant

## 2021-05-17 ENCOUNTER — Other Ambulatory Visit: Payer: Self-pay

## 2021-05-17 ENCOUNTER — Ambulatory Visit (INDEPENDENT_AMBULATORY_CARE_PROVIDER_SITE_OTHER): Payer: No Typology Code available for payment source | Admitting: Physician Assistant

## 2021-05-17 VITALS — BP 124/62 | HR 72 | Temp 98.2°F | Resp 18 | Ht 63.0 in | Wt 160.0 lb

## 2021-05-17 DIAGNOSIS — E559 Vitamin D deficiency, unspecified: Secondary | ICD-10-CM | POA: Diagnosis not present

## 2021-05-17 DIAGNOSIS — R0789 Other chest pain: Secondary | ICD-10-CM | POA: Diagnosis not present

## 2021-05-17 MED ORDER — PANTOPRAZOLE SODIUM 40 MG PO TBEC: 40.0000 mg | DELAYED_RELEASE_TABLET | Freq: Every day | ORAL | 3 refills | Status: DC

## 2021-05-17 MED ORDER — PREDNISONE 10 MG PO TABS: ORAL_TABLET | ORAL | 0 refills | Status: AC

## 2021-05-17 NOTE — Progress Notes (Signed)
New Patient Office Visit  Subjective:  Patient ID: Selena Scott, female    DOB: [birth year]  Age: 41 y.o. MRN: [id number]  CC:  Chief Complaint  Patient presents with   Hospitalization Follow-up    Chest/Shoulder pain    HPI Selena Scott reports that she was seen in the emergency department yesterday with a complaint of chest wall pain.  States that she has been having on and off pain in her left chest for the past week and a half, states that it will also hurt on the right side as well, states that she will have shortness of breath when the pain comes, has taken Gas-X pills without relief.  Denies any recent trauma or injury to the chest. Hospital note:                        Medical Decision Making Amount and/or Complexity of Data Reviewed Labs: ordered. Decision-making details documented in ED Course. Radiology: ordered.   Risk Prescription drug management.     Patient presents to the ED with intermittent chest pain x 1.5 weeks.  Patient notes that her chest pain is localized currently to her sternal and right side.  No prior history of MI, catheterization, DVT/PE.  Vital signs stable, patient not tachycardic or hypoxic, afebrile.  On exam patient with tenderness to palpation noted to right chest wall and sternal region.  Otherwise no acute cardiovascular, respiratory, abdominal exam findings.  Differential diagnosis includes ACS, pneumothorax, PE, pneumonia, COVID, flu, muscular etiology.     Labs:  I ordered, and personally interpreted labs.  The pertinent results include:  Both troponins less than 2. Data hCG negative. CBC unremarkable, hemoglobin 11.7 however stable from priors. BMP unremarkable, slightly decreased calcium at 7.9.  Patient takes calcitriol.   Imaging: I ordered imaging studies including chest x-ray I independently visualized and interpreted imaging which showed no acute cardiopulmonary findings I agree with the radiologist interpretation    Medications:  I ordered medication including Percocet and Robaxin and hot pack for pain management Reevaluation of the patient after these medicines showed that the patient improved I have reviewed the patients home medicines and have made adjustments as needed   Reevaluation: After the interventions noted above, I reevaluated the patient and found that they have :improved   Cardiac Monitoring: The patient was maintained on a cardiac monitor.  I personally viewed and interpreted the cardiac monitored which showed an underlying rhythm of: Normal sinus rhythm at a rate of 68.    Test Considered: Considered CTA PE however patient without PE risk factors, PERC negative.  Not tachycardic on exam.     Disposition: Patient presentation suspicious for muscular etiology as cause of pain.  Doubt COVID, flu, pneumonia, pneumothorax, ACS at this time.  EKG without acute ST/T changes, troponins negative, chest x-ray negative, low suspicion for ACS at this time.  Chest x-ray without acute findings, vital signs stable, doubt pneumothorax or pneumonia at this time.  No risk factors for PE, no unilateral lower extremity swelling, HRT, OCP, recent immobilization, surgery, anticoagulant use, doubt pulmonary embolism at this time.  After consideration of the diagnostic results and the patients response to treatment, I feel that the patient would benefit from discharge home with close primary care follow-up.  Was initially going to send patient home with Robaxin prescription however after Robaxin was given patient noted that she felt her heart was throbbing even more and beating out of her chest.  In  light of this we will defer treatment with prescription Robaxin.  Patient given additional Percocet prior to discharge.  We will send 2 tablets of Percocet to patient's pharmacy and instructed patient to use for breakthrough pain.  In-depth conversation with patient and husband at bedside regarding narcotics and the  utilization. Supportive care measures and strict return precautions discussed with patient at bedside. Pt acknowledges and verbalizes understanding. Pt appears safe for discharge. Follow up as indicated in discharge paperwork.     States today that she was treated for cough when she developed COVID at the end of December 2022, but states that her cough had resolved quickly and does not feel that this is corresponding to her pain.  States that she does have a 80-year-old son, and did think him laying on her chest may have caused her pain.  States that pain is sharp, feels like pressure, can last a few seconds up to several minutes at a time.  States that it has become more more frequent.  States that she does see endocrinology, states that she has not been out of any of her medications prescribed by them.     Past Medical History:  Diagnosis Date   Anemia    Fibroid    GERD (gastroesophageal reflux disease)    hx of    HPV (human papilloma virus) anogenital infection    Medical history non-contributory    Thyroid disease     Past Surgical History:  Procedure Laterality Date   NO PAST SURGERIES     THYROIDECTOMY N/A 04/19/2017   Procedure: TOTAL THYROIDECTOMY;  Surgeon: Armandina Gemma, MD;  Location: [facility];  Service: General;  Laterality: N/A;    Family History  Problem Relation Age of Onset   Diabetes type II Mother    Breast cancer Mother    Hypertension Mother    Thyroid disease Neg Hx     Social History   Socioeconomic History   Marital status: Married    Spouse name: Not on file   Number of children: Not on file   Years of education: Not on file   Highest education level: Not on file  Occupational History   Not on file  Tobacco Use   Smoking status: Never   Smokeless tobacco: Never  Vaping Use   Vaping Use: Never used  Substance and Sexual Activity   Alcohol use: No   Drug use: No   Sexual activity: Yes  Other Topics Concern   Not on file  Social History  Narrative   Not on file   Social Determinants of Health   Financial Resource Strain: Not on file  Food Insecurity: Not on file  Transportation Needs: Not on file  Physical Activity: Not on file  Stress: Not on file  Social Connections: Not on file  Intimate Partner Violence: Not on file    ROS Review of Systems  Constitutional:  Negative for chills and fever.  HENT: Negative.    Eyes: Negative.   Respiratory:  Positive for chest tightness and shortness of breath. Negative for cough.   Cardiovascular:  Positive for chest pain.  Gastrointestinal:  Negative for abdominal pain, nausea and vomiting.  Endocrine: Negative.   Genitourinary: Negative.   Musculoskeletal:  Positive for myalgias.  Skin: Negative.   Allergic/Immunologic: Negative.   Neurological: Negative.   Hematological: Negative.   Psychiatric/Behavioral: Negative.     Objective:   Today's Vitals: BP 124/62 (BP Location: Left Arm, Patient Position: Sitting, Cuff Size: Normal)  Pulse 72    Temp 98.2 F (36.8 C) (Oral)    Resp 18    Ht 5\' 3"  (1.6 m)    Wt 160 lb (72.6 kg)    LMP 05/10/2021    SpO2 98%    BMI 28.34 kg/m   Physical Exam Vitals and nursing note reviewed.  Constitutional:      Appearance: Normal appearance.  HENT:     Head: Normocephalic and atraumatic.     Right Ear: External ear normal.     Left Ear: External ear normal.  Eyes:     Extraocular Movements: Extraocular movements intact.     Conjunctiva/sclera: Conjunctivae normal.     Pupils: Pupils are equal, round, and reactive to light.  Cardiovascular:     Rate and Rhythm: Normal rate and regular rhythm.     Pulses: Normal pulses.     Heart sounds: Normal heart sounds.  Pulmonary:     Effort: Pulmonary effort is normal. No respiratory distress.     Breath sounds: Normal breath sounds. No wheezing.  Chest:     Chest wall: Tenderness present.  Musculoskeletal:     Right shoulder: Decreased range of motion.     Cervical back: Normal  range of motion and neck supple.     Comments: Chest wall pain elicited with movement of right arm Chest wall pain elicited with shoulder movements   Skin:    General: Skin is warm and dry.  Neurological:     General: No focal deficit present.     Mental Status: She is alert and oriented to person, place, and time.  Psychiatric:        Mood and Affect: Mood normal.        Behavior: Behavior normal.        Thought Content: Thought content normal.        Judgment: Judgment normal.    Assessment & Plan:   Problem List Items Addressed This Visit   None Visit Diagnoses     Chest wall pain    -  Primary   Relevant Medications   pantoprazole (PROTONIX) 40 MG tablet   predniSONE (DELTASONE) 10 MG tablet   Vitamin D deficiency           Outpatient Encounter Medications as of 05/17/2021  Medication Sig   calcitRIOL (ROCALTROL) 0.25 MCG capsule Take 0.5 mcg by mouth 2 (two) times daily.   calcium carbonate (TUMS - DOSED IN MG ELEMENTAL CALCIUM) 500 MG chewable tablet Chew 1 tablet by mouth 3 (three) times daily.   cholecalciferol (VITAMIN D3) 25 MCG (1000 UT) tablet Take 2,000 Units by mouth daily.   levothyroxine (SYNTHROID) 88 MCG tablet TAKE 1 TABLET BY MOUTH ONCE DAILY BEFORE BREAKFAST   oxyCODONE-acetaminophen (PERCOCET/ROXICET) 5-325 MG tablet Take 1 tablet by mouth every 6 (six) hours as needed for severe pain.   pantoprazole (PROTONIX) 40 MG tablet Take 1 tablet (40 mg total) by mouth daily.   predniSONE (DELTASONE) 10 MG tablet Take 4 tablets (40 mg total) by mouth daily with breakfast for 3 days, THEN 3 tablets (30 mg total) daily with breakfast for 3 days, THEN 2 tablets (20 mg total) daily with breakfast for 3 days, THEN 1 tablet (10 mg total) daily with breakfast for 3 days.   naproxen (NAPROSYN) 375 MG tablet Take 1 tablet (375 mg total) by mouth 2 (two) times daily. (Patient not taking: Reported on 05/17/2021)   [DISCONTINUED] calcitRIOL (ROCALTROL) 0.5 MCG capsule Take 1  capsule (  0.5 mcg total) by mouth 2 (two) times daily. (Patient not taking: Reported on 04/25/2018)   [DISCONTINUED] orphenadrine (NORFLEX) 100 MG tablet Take 1 tablet (100 mg total) by mouth 2 (two) times daily.   No facility-administered encounter medications on file as of 05/17/2021.   1. Chest wall pain Trial Protonix, trial prednisone taper.  Patient education given on supportive care, red flags for prompt reevaluation.  Patient encouraged to return to mobile medicine team in 1 week if symptoms have not improved.  Patient given appointment to establish care Primary Care at [facility].  - pantoprazole (PROTONIX) 40 MG tablet; Take 1 tablet (40 mg total) by mouth daily.  Dispense: 30 tablet; Refill: 3 - predniSONE (DELTASONE) 10 MG tablet; Take 4 tablets (40 mg total) by mouth daily with breakfast for 3 days, THEN 3 tablets (30 mg total) daily with breakfast for 3 days, THEN 2 tablets (20 mg total) daily with breakfast for 3 days, THEN 1 tablet (10 mg total) daily with breakfast for 3 days.  Dispense: 30 tablet; Refill: 0  2. Vitamin D deficiency Patient encouraged to maintain follow-up with endocrinology   I have reviewed the patient's medical history (PMH, PSH, Social History, Family History, Medications, and allergies) , and have been updated if relevant. I spent 20 minutes reviewing chart and  face to face time with patient.    Follow-up: Return for needs PCP here in next month .   Loraine Grip Mayers, PA-C z

## 2021-05-17 NOTE — Patient Instructions (Signed)
You are going to take a prednisone taper to help you with your chest wall pain, you will also start taking Protonix once daily in the morning.  Please feel free to return in 1 week if your symptoms have not improved.  I hope that you feel better soon  Kennieth Rad, PA-C Physician Assistant [place] [email or web address]   Chest Wall Pain Chest wall pain is pain in or around the bones and muscles of your chest. Sometimes, an injury causes this pain. Excessive coughing or overuse of arm and chest muscles may also cause chest wall pain. Sometimes, the cause may not be known. This pain may take several weeks or longer to get better. Follow these instructions at home: Managing pain, stiffness, and swelling  If directed, put ice on the painful area: Put ice in a plastic bag. Place a towel between your skin and the bag. Leave the ice on for 20 minutes, 2-3 times per day. Activity Rest as told by your health care provider. Avoid activities that cause pain. These include any activities that use your chest muscles or your abdominal and side muscles to lift heavy items. Ask your health care provider what activities are safe for you. General instructions  Take over-the-counter and prescription medicines only as told by your health care provider. Do not use any products that contain nicotine or tobacco, such as cigarettes, e-cigarettes, and chewing tobacco. These can delay healing after injury. If you need help quitting, ask your health care provider. Keep all follow-up visits as told by your health care provider. This is important. Contact a health care provider if: You have a fever. Your chest pain becomes worse. You have new symptoms. Get help right away if: You have nausea or vomiting. You feel sweaty or light-headed. You have a cough with mucus from your lungs (sputum) or you cough up blood. You develop shortness of breath. These  symptoms may represent a serious problem that is an emergency. Do not wait to see if the symptoms will go away. Get medical help right away. Call your local emergency services (911 in the [place] Do not drive yourself to the hospital. Summary Chest wall pain is pain in or around the bones and muscles of your chest. Depending on the cause, it may be treated with ice, rest, medicines, and avoiding activities that cause pain. Contact a health care provider if you have a fever, worsening chest pain, or new symptoms. Get help right away if you feel light-headed or you develop shortness of breath. These symptoms may be an emergency. This information is not intended to replace advice given to you by your health care provider. Make sure you discuss any questions you have with your health care provider. Document Revised: 05/28/2020 Document Reviewed: 05/28/2020 Elsevier Patient Education  [place].

## 2021-05-17 NOTE — Progress Notes (Signed)
Patient took oxycodone 1.5 ago. Patient has eaten today. Patient reports intermittent pain in the chest beginning 1 week ago. Patient reports right arm/shoulder pain being present yesterday and today described as throbbing.

## 2021-05-18 ENCOUNTER — Telehealth: Payer: Self-pay | Admitting: Physician Assistant

## 2021-05-18 NOTE — Telephone Encounter (Signed)
Pt needs her return to work letter dated to return this Friday, Feb. 24, 2023, not Thursday the 23rd. Please advise once it is complete and I will call the patient.

## 2021-05-20 ENCOUNTER — Ambulatory Visit: Payer: No Typology Code available for payment source | Admitting: Family Medicine

## 2021-05-20 ENCOUNTER — Other Ambulatory Visit: Payer: Self-pay

## 2021-05-20 ENCOUNTER — Encounter: Payer: Self-pay | Admitting: Family Medicine

## 2021-05-20 DIAGNOSIS — R0789 Other chest pain: Secondary | ICD-10-CM

## 2021-05-20 DIAGNOSIS — R1011 Right upper quadrant pain: Secondary | ICD-10-CM | POA: Diagnosis not present

## 2021-05-20 DIAGNOSIS — F411 Generalized anxiety disorder: Secondary | ICD-10-CM | POA: Diagnosis not present

## 2021-05-20 MED ORDER — CYCLOBENZAPRINE HCL 10 MG PO TABS: 10.0000 mg | ORAL_TABLET | Freq: Three times a day (TID) | ORAL | 0 refills | Status: DC | PRN

## 2021-05-20 MED ORDER — PROMETHAZINE HCL 25 MG/ML IJ SOLN
25.0000 mg | Freq: Once | INTRAMUSCULAR | Status: AC
  Administered 2021-05-20: 25 mg via INTRAMUSCULAR

## 2021-05-20 MED ORDER — TRIAMCINOLONE ACETONIDE 40 MG/ML IJ SUSP
40.0000 mg | Freq: Once | INTRAMUSCULAR | Status: AC
  Administered 2021-05-20: 40 mg via INTRAMUSCULAR

## 2021-05-20 MED ORDER — KETOROLAC TROMETHAMINE 60 MG/2ML IM SOLN
60.0000 mg | Freq: Once | INTRAMUSCULAR | Status: AC
  Administered 2021-05-20: 60 mg via INTRAMUSCULAR

## 2021-05-20 NOTE — Progress Notes (Signed)
Established Patient Office Visit  Subjective:  Patient ID: Selena Scott, female    DOB: 1980/05/27  Age: 41 y.o. MRN: [id number]  CC: No chief complaint on file.   HPI Erina Glodowski presents for complaint of rightsided chest and abdominal pain for aboutr 4-5 days. It had acute onset.  Patient was seen at [facility] for similar sx and a cardiac workup was negative. Patient reports sx have not resolved.  Past Medical History:  Diagnosis Date   Anemia    Fibroid    GERD (gastroesophageal reflux disease)    hx of    HPV (human papilloma virus) anogenital infection    Medical history non-contributory    Thyroid disease     Past Surgical History:  Procedure Laterality Date   NO PAST SURGERIES     THYROIDECTOMY N/A 04/19/2017   Procedure: TOTAL THYROIDECTOMY;  Surgeon: Armandina Gemma, MD;  Location: [facility];  Service: General;  Laterality: N/A;    Family History  Problem Relation Age of Onset   Diabetes type II Mother    Breast cancer Mother    Hypertension Mother    Thyroid disease Neg Hx     Social History   Socioeconomic History   Marital status: Married    Spouse name: Not on file   Number of children: Not on file   Years of education: Not on file   Highest education level: Not on file  Occupational History   Not on file  Tobacco Use   Smoking status: Never   Smokeless tobacco: Never  Vaping Use   Vaping Use: Never used  Substance and Sexual Activity   Alcohol use: No   Drug use: No   Sexual activity: Yes  Other Topics Concern   Not on file  Social History Narrative   Not on file   Social Determinants of Health   Financial Resource Strain: Not on file  Food Insecurity: Not on file  Transportation Needs: Not on file  Physical Activity: Not on file  Stress: Not on file  Social Connections: Not on file  Intimate Partner Violence: Not on file    ROS Review of Systems  Constitutional:  Negative for chills and fever.  Respiratory: Negative.     Cardiovascular:  Positive for chest pain. Negative for leg swelling.  Gastrointestinal:  Positive for abdominal pain. Negative for constipation, diarrhea, nausea and rectal pain.  Genitourinary:  Negative for dyspareunia and flank pain.  All other systems reviewed and are negative.  Objective:   Today's Vitals: LMP 05/10/2021   Physical Exam Vitals and nursing note reviewed.  Constitutional:      General: She is in acute distress.     Comments: This exam was difficult to perform 2/2 patient compliance 2/2 stated sx  Cardiovascular:     Rate and Rhythm: Normal rate and regular rhythm.  Pulmonary:     Effort: Pulmonary effort is normal.     Breath sounds: Normal breath sounds.  Chest:     Chest wall: Tenderness present. No deformity.  Abdominal:     Tenderness: There is abdominal tenderness in the right upper quadrant. There is no right CVA tenderness or left CVA tenderness.  Neurological:     Mental Status: She is alert.  Psychiatric:        Mood and Affect: Mood is anxious.    Assessment & Plan:   1. RUQ pain Additional monitoring labs were obtained. Referral for RUQ u/s  to rule out GB etiology) - ketorolac (  TORADOL) injection 60 mg - CMP14+EGFR - Amylase - Lipase - US Abdomen Limited RUQ (LIVER/GB); Future  2. Chest wall pain Phenergan, toradol, and kenalog IM given. Recommended patient to d/c dosepack  - ketorolac (TORADOL) injection 60 mg - promethazine (PHENERGAN) injection 25 mg - triamcinolone acetonide (KENALOG-40) injection 40 mg  3. Anxiety state Phenergan IM   - ketorolac (TORADOL) injection 60 mg - promethazine (PHENERGAN) injection 25 mg    Outpatient Encounter Medications as of 05/20/2021  Medication Sig   cyclobenzaprine (FLEXERIL) 10 MG tablet Take 1 tablet (10 mg total) by mouth 3 (three) times daily as needed for muscle spasms.   calcitRIOL (ROCALTROL) 0.25 MCG capsule Take 0.5 mcg by mouth 2 (two) times daily.   calcium carbonate (TUMS -  DOSED IN MG ELEMENTAL CALCIUM) 500 MG chewable tablet Chew 1 tablet by mouth 3 (three) times daily.   cholecalciferol (VITAMIN D3) 25 MCG (1000 UT) tablet Take 2,000 Units by mouth daily.   levothyroxine (SYNTHROID) 88 MCG tablet TAKE 1 TABLET BY MOUTH ONCE DAILY BEFORE BREAKFAST   naproxen (NAPROSYN) 375 MG tablet Take 1 tablet (375 mg total) by mouth 2 (two) times daily. (Patient not taking: Reported on 05/17/2021)   oxyCODONE-acetaminophen (PERCOCET/ROXICET) 5-325 MG tablet Take 1 tablet by mouth every 6 (six) hours as needed for severe pain.   pantoprazole (PROTONIX) 40 MG tablet Take 1 tablet (40 mg total) by mouth daily.   predniSONE (DELTASONE) 10 MG tablet Take 4 tablets (40 mg total) by mouth daily with breakfast for 3 days, THEN 3 tablets (30 mg total) daily with breakfast for 3 days, THEN 2 tablets (20 mg total) daily with breakfast for 3 days, THEN 1 tablet (10 mg total) daily with breakfast for 3 days.   [EXPIRED] ketorolac (TORADOL) injection 60 mg    [EXPIRED] promethazine (PHENERGAN) injection 25 mg    [EXPIRED] triamcinolone acetonide (KENALOG-40) injection 40 mg    No facility-administered encounter medications on file as of 05/20/2021.    Follow-up: No follow-ups on file.   Becky Sax, MD

## 2021-05-21 ENCOUNTER — Ambulatory Visit (HOSPITAL_BASED_OUTPATIENT_CLINIC_OR_DEPARTMENT_OTHER)
Admission: RE | Admit: 2021-05-21 | Discharge: 2021-05-21 | Disposition: A | Discharge status: Home / Self Care | Payer: No Typology Code available for payment source | Source: Ambulatory Visit | Attending: Family Medicine | Admitting: Family Medicine

## 2021-05-21 DIAGNOSIS — R1011 Right upper quadrant pain: Secondary | ICD-10-CM | POA: Diagnosis present

## 2021-05-21 LAB — LIPASE: Lipase: 64 U/L (ref 14–72)

## 2021-05-21 LAB — CMP14+EGFR
ALT: 11 IU/L (ref 0–32)
AST: 13 IU/L (ref 0–40)
Albumin/Globulin Ratio: 1.7 (ref 1.2–2.2)
Albumin: 4.5 g/dL (ref 3.8–4.8)
Alkaline Phosphatase: 56 IU/L (ref 44–121)
BUN/Creatinine Ratio: 11 (ref 9–23)
BUN: 10 mg/dL (ref 6–24)
Bilirubin Total: <0.2 mg/dL (ref 0.0–1.2)
CO2: 23 mmol/L (ref 20–29)
Calcium: 8.4 mg/dL — ABNORMAL LOW (ref 8.7–10.2)
Chloride: 103 mmol/L (ref 96–106)
Creatinine, Ser: 0.94 mg/dL (ref 0.57–1.00)
Globulin, Total: 2.7 g/dL (ref 1.5–4.5)
Glucose: 94 mg/dL (ref 70–99)
Potassium: 3.8 mmol/L (ref 3.5–5.2)
Sodium: 142 mmol/L (ref 134–144)
Total Protein: 7.2 g/dL (ref 6.0–8.5)
eGFR: 77 mL/min/{1.73_m2} (ref >59)

## 2021-05-21 LAB — AMYLASE: Amylase: 131 U/L — ABNORMAL HIGH (ref 31–110)

## 2021-05-23 ENCOUNTER — Telehealth: Payer: Self-pay | Admitting: Family Medicine

## 2021-05-23 ENCOUNTER — Encounter: Payer: Self-pay | Admitting: Family Medicine

## 2021-05-23 ENCOUNTER — Other Ambulatory Visit: Payer: Self-pay | Admitting: *Deleted

## 2021-05-23 DIAGNOSIS — R1011 Right upper quadrant pain: Secondary | ICD-10-CM

## 2021-05-23 NOTE — Telephone Encounter (Signed)
Asking for results from Imaging done this Saturday and labwork and Pt states Husband will drop paperwork for leave of absence at 1: 15. Pt had appt for this on Friday but couldn't get docs printed.

## 2021-05-23 NOTE — Telephone Encounter (Signed)
Paperwork was left by Pt's husband for PCP to fill out for Disability.  Please advise and thank you

## 2021-05-24 NOTE — Telephone Encounter (Signed)
Paper received and given to provider

## 2021-05-25 ENCOUNTER — Other Ambulatory Visit: Payer: Self-pay

## 2021-05-25 ENCOUNTER — Ambulatory Visit (INDEPENDENT_AMBULATORY_CARE_PROVIDER_SITE_OTHER): Payer: No Typology Code available for payment source | Admitting: Family Medicine

## 2021-05-25 ENCOUNTER — Encounter: Payer: Self-pay | Admitting: Family Medicine

## 2021-05-25 ENCOUNTER — Other Ambulatory Visit: Payer: No Typology Code available for payment source

## 2021-05-25 VITALS — BP 119/77 | HR 73 | Temp 98.1°F | Resp 16 | Wt 164.6 lb

## 2021-05-25 DIAGNOSIS — M79621 Pain in right upper arm: Secondary | ICD-10-CM

## 2021-05-25 DIAGNOSIS — R1011 Right upper quadrant pain: Secondary | ICD-10-CM

## 2021-05-25 DIAGNOSIS — R0789 Other chest pain: Secondary | ICD-10-CM | POA: Diagnosis not present

## 2021-05-25 NOTE — Progress Notes (Signed)
Patient  is here for follow-up. Patient is here to get her FMLA paper work filled out .  ? ?Patient is still c/o right side chest and arm pain 7/10. Patient said this has been going on for about  10 days . Patient is taking prescribe medication with little relief.  ?

## 2021-05-25 NOTE — Progress Notes (Signed)
Patient came in for lab work and will see provider later this day  ?

## 2021-05-25 NOTE — Progress Notes (Signed)
? ?Established Patient Office Visit ? ?Subjective:  ?Patient ID: Selena Scott, female    DOB: Feb 14, 1981  Age: 41 y.o. MRN: [id number] ? ?CC:  ?Chief Complaint  ?Patient presents with  ? Follow-up  ?  pape  ? paperwork  ? ? ?HPI ?Selena Scott presents for follow up of chest pain with right upper extremity pain. She reports that the pain has moved up and is still excruciating and that that she is unable to utilize her right arm although she is left hand dominant.  ? ? ?Past Medical History:  ?Diagnosis Date  ? Anemia   ? Fibroid   ? GERD (gastroesophageal reflux disease)   ? hx of   ? HPV (human papilloma virus) anogenital infection   ? Medical history non-contributory   ? Thyroid disease   ? ? ?Past Surgical History:  ?Procedure Laterality Date  ? NO PAST SURGERIES    ? THYROIDECTOMY N/A 04/19/2017  ? Procedure: TOTAL THYROIDECTOMY;  Surgeon: Armandina Gemma, MD;  Location: [facility];  Service: General;  Laterality: N/A;  ? ? ?Family History  ?Problem Relation Age of Onset  ? Diabetes type II Mother   ? Breast cancer Mother   ? Hypertension Mother   ? Thyroid disease Neg Hx   ? ? ?Social History  ? ?Socioeconomic History  ? Marital status: Married  ?  Spouse name: Not on file  ? Number of children: Not on file  ? Years of education: Not on file  ? Highest education level: Not on file  ?Occupational History  ? Not on file  ?Tobacco Use  ? Smoking status: Never  ? Smokeless tobacco: Never  ?Vaping Use  ? Vaping Use: Never used  ?Substance and Sexual Activity  ? Alcohol use: No  ? Drug use: No  ? Sexual activity: Yes  ?Other Topics Concern  ? Not on file  ?Social History Narrative  ? Not on file  ? ?Social Determinants of Health  ? ?Financial Resource Strain: Not on file  ?Food Insecurity: Not on file  ?Transportation Needs: Not on file  ?Physical Activity: Not on file  ?Stress: Not on file  ?Social Connections: Not on file  ?Intimate Partner Violence: Not on file  ? ? ?ROS ?Review of Systems  ?Constitutional:  Negative for  chills and fever.  ?Respiratory: Negative.    ?Cardiovascular:  Positive for chest pain. Negative for leg swelling.  ?Gastrointestinal:  Negative for abdominal pain, constipation, diarrhea, nausea and rectal pain.  ?Genitourinary:  Negative for dyspareunia and flank pain.  ?All other systems reviewed and are negative. ? ?Objective:  ? ?Today's Vitals: BP 119/77   Pulse 73   Temp 98.1 ?F (36.7 ?C) (Oral)   Resp 16   Wt 164 lb 9.6 oz (74.7 kg)   LMP 05/10/2021   SpO2 97%   BMI 29.16 kg/m?  ? ?Physical Exam ?Vitals and nursing note reviewed.  ?Constitutional:   ?   General: She is in acute distress.  ?   Comments: This exam was difficult to perform 2/2 patient compliance 2/2 stated sx  ?Cardiovascular:  ?   Rate and Rhythm: Normal rate and regular rhythm.  ?Pulmonary:  ?   Effort: Pulmonary effort is normal.  ?   Breath sounds: Normal breath sounds.  ?Chest:  ?   Chest wall: Tenderness present. No deformity.  ?Musculoskeletal:  ?   Right upper arm: Tenderness present. No swelling, edema or deformity.  ?   Left upper arm: Normal.  ?Neurological:  ?  General: No focal deficit present.  ?   Mental Status: She is alert and oriented to person, place, and time.  ?Psychiatric:     ?   Mood and Affect: Mood is anxious.  ? ? ?Assessment & Plan:  ? ?1. Chest wall pain ?Referral to ortho for further eval/mgt ?- Ambulatory referral to Orthopedic Surgery ? ?2. Pain in right upper arm ?As above ? ?Outpatient Encounter Medications as of 05/25/2021  ?Medication Sig  ? calcitRIOL (ROCALTROL) 0.25 MCG capsule Take 0.5 mcg by mouth 2 (two) times daily.  ? calcium carbonate (TUMS - DOSED IN MG ELEMENTAL CALCIUM) 500 MG chewable tablet Chew 1 tablet by mouth 3 (three) times daily.  ? cholecalciferol (VITAMIN D3) 25 MCG (1000 UT) tablet Take 2,000 Units by mouth daily.  ? cyclobenzaprine (FLEXERIL) 10 MG tablet Take 1 tablet (10 mg total) by mouth 3 (three) times daily as needed for muscle spasms.  ? Ergocalciferol (VITAMIN D2) 10 MCG  (400 UNIT) TABS Vitamin D2  ? levothyroxine (SYNTHROID) 88 MCG tablet TAKE 1 TABLET BY MOUTH ONCE DAILY BEFORE BREAKFAST  ? oxyCODONE-acetaminophen (PERCOCET/ROXICET) 5-325 MG tablet Take 1 tablet by mouth every 6 (six) hours as needed for severe pain.  ? pantoprazole (PROTONIX) 40 MG tablet Take 1 tablet (40 mg total) by mouth daily.  ? predniSONE (DELTASONE) 10 MG tablet Take 4 tablets (40 mg total) by mouth daily with breakfast for 3 days, THEN 3 tablets (30 mg total) daily with breakfast for 3 days, THEN 2 tablets (20 mg total) daily with breakfast for 3 days, THEN 1 tablet (10 mg total) daily with breakfast for 3 days.  ? ?No facility-administered encounter medications on file as of 05/25/2021.  ? ? ?Follow-up: No follow-ups on file.  ? ?Becky Sax, MD ? ?

## 2021-05-26 ENCOUNTER — Telehealth: Payer: Self-pay

## 2021-05-26 ENCOUNTER — Ambulatory Visit (HOSPITAL_BASED_OUTPATIENT_CLINIC_OR_DEPARTMENT_OTHER)
Admission: RE | Admit: 2021-05-26 | Discharge: 2021-05-26 | Disposition: A | Discharge status: Home / Self Care | Payer: No Typology Code available for payment source | Source: Ambulatory Visit | Attending: Orthopaedic Surgery | Admitting: Orthopaedic Surgery

## 2021-05-26 ENCOUNTER — Ambulatory Visit (HOSPITAL_BASED_OUTPATIENT_CLINIC_OR_DEPARTMENT_OTHER): Payer: No Typology Code available for payment source | Admitting: Orthopaedic Surgery

## 2021-05-26 ENCOUNTER — Other Ambulatory Visit (HOSPITAL_BASED_OUTPATIENT_CLINIC_OR_DEPARTMENT_OTHER): Payer: Self-pay

## 2021-05-26 ENCOUNTER — Other Ambulatory Visit (HOSPITAL_BASED_OUTPATIENT_CLINIC_OR_DEPARTMENT_OTHER): Payer: Self-pay | Admitting: Orthopaedic Surgery

## 2021-05-26 DIAGNOSIS — M25511 Pain in right shoulder: Secondary | ICD-10-CM | POA: Diagnosis present

## 2021-05-26 DIAGNOSIS — G2589 Other specified extrapyramidal and movement disorders: Secondary | ICD-10-CM

## 2021-05-26 MED ORDER — METHOCARBAMOL 500 MG PO TABS
500.0000 mg | ORAL_TABLET | Freq: Two times a day (BID) | ORAL | 3 refills | Status: DC
  Filled 2021-05-26: qty 30, 15d supply, fill #0

## 2021-05-26 NOTE — Telephone Encounter (Signed)
[facility] received a lavender tube with no orders, asking if there was something they needed to run. Asked for a returned phone call.  ?

## 2021-05-26 NOTE — Progress Notes (Signed)
? ?                            ? ? ?Chief Complaint: Scapular pain ?  ? ? ?History of Present Illness:  ? ? ?Selena Scott is a 41 y.o. female presents with 2 weeks of acute right periscapular shoulder girdle pain.  She not have any specific injury or incident.  She states that she is experiencing a deep muscular spasm predominantly in the serratus anterior distribution but also behind and medial to the scapula and in the upper trapezius.  She was given prednisone in the emergency room which only helped transiently.  The she is taking Tylenol and ibuprofen with limited relief.  She works in [place].  She continues to experience significant muscle spasms.  She does have a history of thyroid and electrolyte abnormalities which are currently being managed with a new endocrinologist. ? ? ? ?Surgical History:   ?None ? ?PMH/PSH/Family History/Social History/Meds/Allergies:   ? ?Past Medical History:  ?Diagnosis Date  ? Anemia   ? Fibroid   ? GERD (gastroesophageal reflux disease)   ? hx of   ? HPV (human papilloma virus) anogenital infection   ? Medical history non-contributory   ? Thyroid disease   ? ?Past Surgical History:  ?Procedure Laterality Date  ? NO PAST SURGERIES    ? THYROIDECTOMY N/A 04/19/2017  ? Procedure: TOTAL THYROIDECTOMY;  Surgeon: Armandina Gemma, MD;  Location: [facility];  Service: General;  Laterality: N/A;  ? ?Social History  ? ?Socioeconomic History  ? Marital status: Married  ?  Spouse name: Not on file  ? Number of children: Not on file  ? Years of education: Not on file  ? Highest education level: Not on file  ?Occupational History  ? Not on file  ?Tobacco Use  ? Smoking status: Never  ? Smokeless tobacco: Never  ?Vaping Use  ? Vaping Use: Never used  ?Substance and Sexual Activity  ? Alcohol use: No  ? Drug use: No  ? Sexual activity: Yes  ?Other Topics Concern  ? Not on file  ?Social History Narrative  ? Not on file  ? ?Social Determinants of Health  ? ?Financial Resource Strain: Not on file  ?Food  Insecurity: Not on file  ?Transportation Needs: Not on file  ?Physical Activity: Not on file  ?Stress: Not on file  ?Social Connections: Not on file  ? ?Family History  ?Problem Relation Age of Onset  ? Diabetes type II Mother   ? Breast cancer Mother   ? Hypertension Mother   ? Thyroid disease Neg Hx   ? ?No Known Allergies ?Current Outpatient Medications  ?Medication Sig Dispense Refill  ? methocarbamol (ROBAXIN) 500 MG tablet Take 1 tablet (500 mg total) by mouth in the morning and at bedtime. 30 tablet 3  ? calcitRIOL (ROCALTROL) 0.25 MCG capsule Take 0.5 mcg by mouth 2 (two) times daily.    ? calcium carbonate (TUMS - DOSED IN MG ELEMENTAL CALCIUM) 500 MG chewable tablet Chew 1 tablet by mouth 3 (three) times daily.    ? cholecalciferol (VITAMIN D3) 25 MCG (1000 UT) tablet Take 2,000 Units by mouth daily.    ? cyclobenzaprine (FLEXERIL) 10 MG tablet Take 1 tablet (10 mg total) by mouth 3 (three) times daily as needed for muscle spasms. 30 tablet 0  ? Ergocalciferol (VITAMIN D2) 10 MCG (400 UNIT) TABS Vitamin D2    ? levothyroxine (SYNTHROID) 88 MCG  tablet TAKE 1 TABLET BY MOUTH ONCE DAILY BEFORE BREAKFAST 90 tablet 0  ? oxyCODONE-acetaminophen (PERCOCET/ROXICET) 5-325 MG tablet Take 1 tablet by mouth every 6 (six) hours as needed for severe pain. 2 tablet 0  ? pantoprazole (PROTONIX) 40 MG tablet Take 1 tablet (40 mg total) by mouth daily. 30 tablet 3  ? predniSONE (DELTASONE) 10 MG tablet Take 4 tablets (40 mg total) by mouth daily with breakfast for 3 days, THEN 3 tablets (30 mg total) daily with breakfast for 3 days, THEN 2 tablets (20 mg total) daily with breakfast for 3 days, THEN 1 tablet (10 mg total) daily with breakfast for 3 days. 30 tablet 0  ? ?No current facility-administered medications for this visit.  ? ?No results found. ? ?Review of Systems:   ?A ROS was performed including pertinent positives and negatives as documented in the HPI. ? ?Physical Exam :   ?Constitutional: NAD and appears stated  age ?Neurological: Alert and oriented ?Psych: Appropriate affect and cooperative ?Last menstrual period 05/10/2021.  ? ?Comprehensive Musculoskeletal Exam:   ? ?Musculoskeletal Exam    ?Inspection Right Left  ?Skin No atrophy or winging No atrophy or winging  ?Palpation    ?Tenderness Periscapular, serratus anterior, rhomboids, upper trap None  ?Range of Motion    ?Flexion (passive) 170 170  ?Flexion (active) 170 170  ?Abduction 170 170  ?ER at the side 70 70  ?Can reach behind back to T12 T12  ?Strength    ? Limited by pain None  ?Special Tests    ?Pseudoparalytic No No  ?Neurologic    ?Fires PIN, radial, median, ulnar, musculocutaneous, axillary, suprascapular, long thoracic, and spinal accessory innervated muscles. No abnormal sensibility  ?Vascular/Lymphatic    ?Radial Pulse 2+ 2+  ?Cervical Exam    ?Patient has symmetric cervical range of motion with negative Spurling's test.  ?Special Test:   ? ? ? ?Imaging:   ?Xray (3 views right shoulder, 2 views are clavicle): ?Normal ? ? ?I personally reviewed and interpreted the radiographs. ? ? ?Assessment:   ?41 year old female with right periscapular shoulder girdle type pain which I believe is a result of muscular spasm particularly in the serratus anterior.  At this time I have advised that she start a 2-week course of Robaxin and to take this around-the-clock so as to help her muscle spasms.  I do believe that the etiology of these spasms is most likely her endocrinology electrolyte abnormalities.  That being said I do believe that physical therapy for serratus anterior dry needling and a periscapular strengthening program would be critical for pulling her out of this unfortunately very negative cycle of muscular spasm.  I will see her back in 1 month to reassess ? ?Plan :   ? ?-Return to clinic in 4 weeks ? ? ? ? ?I personally saw and evaluated the patient, and participated in the management and treatment plan. ? ?Vanetta Mulders, MD ?Attending Physician, Orthopedic  Surgery ? ?This document was dictated using Systems analyst. A reasonable attempt at proof reading has been made to minimize errors. ?

## 2021-05-27 ENCOUNTER — Encounter (HOSPITAL_BASED_OUTPATIENT_CLINIC_OR_DEPARTMENT_OTHER): Payer: Self-pay | Admitting: Orthopaedic Surgery

## 2021-05-27 ENCOUNTER — Encounter: Payer: Self-pay | Admitting: Family Medicine

## 2021-05-27 LAB — LIPASE: Lipase: 35 U/L (ref 14–72)

## 2021-05-27 LAB — SPECIMEN STATUS REPORT

## 2021-05-27 LAB — BASIC METABOLIC PANEL
BUN/Creatinine Ratio: 11 (ref 9–23)
BUN: 11 mg/dL (ref 6–24)
CO2: 26 mmol/L (ref 20–29)
Calcium: 7.9 mg/dL — ABNORMAL LOW (ref 8.7–10.2)
Chloride: 101 mmol/L (ref 96–106)
Creatinine, Ser: 0.99 mg/dL (ref 0.57–1.00)
Glucose: 79 mg/dL (ref 70–99)
Potassium: 3.8 mmol/L (ref 3.5–5.2)
Sodium: 143 mmol/L (ref 134–144)
eGFR: 73 mL/min/{1.73_m2} (ref >59)

## 2021-05-27 LAB — AMYLASE: Amylase: 120 U/L — ABNORMAL HIGH (ref 31–110)

## 2021-05-31 ENCOUNTER — Telehealth: Payer: Self-pay | Admitting: Orthopaedic Surgery

## 2021-05-31 NOTE — Telephone Encounter (Signed)
I responded to pts mychart msg about STD forms and advised her that we are unable to complete forms as Dr. Sammuel Hines has not taken her out of work.  ?

## 2021-05-31 NOTE — Telephone Encounter (Signed)
Please advise pts work status. Notes do not reflect pt taken out of work. [employer] disability forms have been received. Thanks! ?

## 2021-06-06 ENCOUNTER — Other Ambulatory Visit (HOSPITAL_BASED_OUTPATIENT_CLINIC_OR_DEPARTMENT_OTHER): Payer: Self-pay

## 2021-06-09 ENCOUNTER — Ambulatory Visit: Payer: No Typology Code available for payment source | Admitting: Internal Medicine

## 2021-06-16 ENCOUNTER — Other Ambulatory Visit: Payer: Self-pay

## 2021-06-16 ENCOUNTER — Ambulatory Visit (INDEPENDENT_AMBULATORY_CARE_PROVIDER_SITE_OTHER): Payer: No Typology Code available for payment source | Admitting: Nurse Practitioner

## 2021-06-16 ENCOUNTER — Ambulatory Visit (HOSPITAL_BASED_OUTPATIENT_CLINIC_OR_DEPARTMENT_OTHER): Payer: No Typology Code available for payment source | Admitting: Physical Therapy

## 2021-06-16 ENCOUNTER — Encounter: Payer: Self-pay | Admitting: Nurse Practitioner

## 2021-06-16 VITALS — BP 136/85 | HR 90 | Temp 98.3°F | Resp 18 | Ht 63.0 in | Wt 162.0 lb

## 2021-06-16 DIAGNOSIS — N926 Irregular menstruation, unspecified: Secondary | ICD-10-CM | POA: Insufficient documentation

## 2021-06-16 DIAGNOSIS — Z3202 Encounter for pregnancy test, result negative: Secondary | ICD-10-CM

## 2021-06-16 DIAGNOSIS — Z3200 Encounter for pregnancy test, result unknown: Secondary | ICD-10-CM | POA: Insufficient documentation

## 2021-06-16 LAB — POCT URINE PREGNANCY: Preg Test, Ur: NEGATIVE

## 2021-06-16 NOTE — Progress Notes (Signed)
$'@Patient'F$  ID: Selena Scott, female    DOB: 09/24/1980, 41 y.o.   MRN: [id number] ? ?Chief Complaint  ?Patient presents with  ? Amenorrhea  ? ? ?Referring provider: ?Dorna Mai, MD ? ? ?HPI ? ?Patient presents today for pregnancy test.  She states that she is currently undergoing physical therapy and had discussed with her physical therapist that her period as 2 weeks late.  The physical therapy department wanted her to come have a pregnancy test to rule out pregnancy before proceeding further with physical therapy.  Urine pregnancy test in office today was negative and patient is requesting serum pregnancy test. Denies f/c/s, n/v/d, hemoptysis, PND, chest pain or edema. ? ? ? ? ?No Known Allergies ? ?There is no immunization history for the selected administration types on file for this patient. ? ?Past Medical History:  ?Diagnosis Date  ? Anemia   ? Fibroid   ? GERD (gastroesophageal reflux disease)   ? hx of   ? HPV (human papilloma virus) anogenital infection   ? Medical history non-contributory   ? Thyroid disease   ? ? ?Tobacco History: ?Social History  ? ?Tobacco Use  ?Smoking Status Never  ?Smokeless Tobacco Never  ? ?Counseling given: Not Answered ? ? ?Outpatient Encounter Medications as of 06/16/2021  ?Medication Sig  ? calcitRIOL (ROCALTROL) 0.25 MCG capsule Take 0.5 mcg by mouth 2 (two) times daily.  ? calcium carbonate (TUMS - DOSED IN MG ELEMENTAL CALCIUM) 500 MG chewable tablet Chew 1 tablet by mouth 3 (three) times daily.  ? cholecalciferol (VITAMIN D3) 25 MCG (1000 UT) tablet Take 2,000 Units by mouth daily.  ? cyclobenzaprine (FLEXERIL) 10 MG tablet Take 1 tablet (10 mg total) by mouth 3 (three) times daily as needed for muscle spasms.  ? Ergocalciferol (VITAMIN D2) 10 MCG (400 UNIT) TABS Vitamin D2  ? levothyroxine (SYNTHROID) 88 MCG tablet TAKE 1 TABLET BY MOUTH ONCE DAILY BEFORE BREAKFAST  ? methocarbamol (ROBAXIN) 500 MG tablet Take 1 tablet (500 mg total) by mouth in the morning and at bedtime.   ? oxyCODONE-acetaminophen (PERCOCET/ROXICET) 5-325 MG tablet Take 1 tablet by mouth every 6 (six) hours as needed for severe pain.  ? pantoprazole (PROTONIX) 40 MG tablet Take 1 tablet (40 mg total) by mouth daily.  ? ?No facility-administered encounter medications on file as of 06/16/2021.  ? ? ? ?Review of Systems ? ?Review of Systems  ?Constitutional: Negative.   ?HENT: Negative.    ?Cardiovascular: Negative.   ?Gastrointestinal: Negative.   ?Allergic/Immunologic: Negative.   ?Neurological: Negative.   ?Psychiatric/Behavioral: Negative.     ? ? ? ?Physical Exam ? ?BP 136/85 (BP Location: Left Arm, Patient Position: Sitting, Cuff Size: Normal)   Pulse 90   Temp 98.3 ?F (36.8 ?C) (Oral)   Resp 18   Ht '5\' 3"'$  (1.6 m)   Wt 162 lb (73.5 kg)   LMP 05/10/2021   SpO2 99%   BMI 28.70 kg/m?  ? ?Wt Readings from Last 5 Encounters:  ?06/16/21 162 lb (73.5 kg)  ?05/25/21 164 lb 9.6 oz (74.7 kg)  ?05/17/21 160 lb (72.6 kg)  ?05/16/21 160 lb (72.6 kg)  ?04/25/18 156 lb (70.8 kg)  ? ? ? ?Physical Exam ?Vitals and nursing note reviewed.  ?Constitutional:   ?   General: She is not in acute distress. ?   Appearance: She is well-developed.  ?Cardiovascular:  ?   Rate and Rhythm: Normal rate and regular rhythm.  ?Pulmonary:  ?   Effort: Pulmonary effort is  normal.  ?   Breath sounds: Normal breath sounds.  ?Neurological:  ?   Mental Status: She is alert and oriented to person, place, and time.  ? ? ? ? ?Assessment & Plan:  ? ?Encounter for pregnancy test, result unknown ?- POCT urine pregnancy ?- hCG, quantitative, pregnancy ? ?2. Missed period ? ?- hCG, quantitative, pregnancy ? ?Follow up if needed ? ? ? ? ?Fenton Foy, NP ?06/16/2021 ? ? ?

## 2021-06-16 NOTE — Addendum Note (Signed)
Addended by: Trecia Rogers on: 06/16/2021 10:32 AM ? ? Modules accepted: Orders ? ?

## 2021-06-16 NOTE — Progress Notes (Signed)
Patient has not eaten or taken medication today. ?Patient reports back pain last night and a missed period for this month. ? ?

## 2021-06-16 NOTE — Assessment & Plan Note (Signed)
-   POCT urine pregnancy ?- hCG, quantitative, pregnancy ? ?2. Missed period ? ?- hCG, quantitative, pregnancy ? ?Follow up if needed ?

## 2021-06-16 NOTE — Patient Instructions (Addendum)
1. Encounter for pregnancy test, result unknown ? ?- POCT urine pregnancy ?- hCG, quantitative, pregnancy ? ?2. Missed period ? ?- hCG, quantitative, pregnancy ? ?Follow up if needed ?

## 2021-06-16 NOTE — Addendum Note (Signed)
Addended by: Trecia Rogers on: 06/16/2021 10:43 AM ? ? Modules accepted: Orders ? ?

## 2021-06-17 LAB — BETA HCG QUANT (REF LAB): hCG Quant: <1 m[IU]/mL

## 2021-06-20 ENCOUNTER — Other Ambulatory Visit: Payer: Self-pay

## 2021-06-20 ENCOUNTER — Ambulatory Visit: Payer: No Typology Code available for payment source | Admitting: Internal Medicine

## 2021-06-20 ENCOUNTER — Encounter: Payer: Self-pay | Admitting: Internal Medicine

## 2021-06-20 ENCOUNTER — Encounter: Payer: Self-pay | Admitting: Family Medicine

## 2021-06-20 ENCOUNTER — Ambulatory Visit (INDEPENDENT_AMBULATORY_CARE_PROVIDER_SITE_OTHER): Payer: No Typology Code available for payment source | Admitting: Family Medicine

## 2021-06-20 VITALS — BP 126/86 | HR 83 | Temp 98.0°F | Resp 16 | Wt 163.6 lb

## 2021-06-20 VITALS — BP 120/70 | HR 82 | Ht 63.0 in | Wt 163.6 lb

## 2021-06-20 DIAGNOSIS — Z0289 Encounter for other administrative examinations: Secondary | ICD-10-CM

## 2021-06-20 DIAGNOSIS — M79621 Pain in right upper arm: Secondary | ICD-10-CM | POA: Diagnosis not present

## 2021-06-20 DIAGNOSIS — E208 Other hypoparathyroidism: Secondary | ICD-10-CM | POA: Diagnosis not present

## 2021-06-20 DIAGNOSIS — R0789 Other chest pain: Secondary | ICD-10-CM | POA: Diagnosis not present

## 2021-06-20 DIAGNOSIS — E890 Postprocedural hypothyroidism: Secondary | ICD-10-CM | POA: Diagnosis not present

## 2021-06-20 DIAGNOSIS — E2089 Other specified hypoparathyroidism: Secondary | ICD-10-CM

## 2021-06-20 LAB — COMPREHENSIVE METABOLIC PANEL
ALT: 10 U/L (ref 0–35)
AST: 12 U/L (ref 0–37)
Albumin: 4.4 g/dL (ref 3.5–5.2)
Alkaline Phosphatase: 56 U/L (ref 39–117)
BUN: 10 mg/dL (ref 6–23)
CO2: 27 mEq/L (ref 19–32)
Calcium: 8.3 mg/dL — ABNORMAL LOW (ref 8.4–10.5)
Chloride: 104 mEq/L (ref 96–112)
Creatinine, Ser: 0.97 mg/dL (ref 0.40–1.20)
GFR: 71.75 mL/min (ref >60.00)
Glucose, Bld: 86 mg/dL (ref 70–99)
Potassium: 4.3 mEq/L (ref 3.5–5.1)
Sodium: 139 mEq/L (ref 135–145)
Total Bilirubin: 0.3 mg/dL (ref 0.2–1.2)
Total Protein: 7.1 g/dL (ref 6.0–8.3)

## 2021-06-20 LAB — PHOSPHORUS: Phosphorus: 4.8 mg/dL — ABNORMAL HIGH (ref 2.3–4.6)

## 2021-06-20 LAB — MAGNESIUM: Magnesium: 1.8 mg/dL (ref 1.5–2.5)

## 2021-06-20 NOTE — Progress Notes (Signed)
?.                                                              Established Patient Office Visit ? ?Subjective:  ?Patient ID: Selena Scott, female    DOB: May 17, 1980  Age: 41 y.o. MRN: [id number] ? ?CC:  ?Chief Complaint  ?Patient presents with  ? Follow-up  ? PAPER WORK  ? ? ?HPI ?Eliz Gadea presents for follow up of chest wall pain and upper right arm pain. Patient reports that the symptoms have resolved and that she is ready to return to work. She does need form completed.  ? ?Past Medical History:  ?Diagnosis Date  ? Anemia   ? Fibroid   ? GERD (gastroesophageal reflux disease)   ? hx of   ? HPV (human papilloma virus) anogenital infection   ? Medical history non-contributory   ? Thyroid disease   ? ? ?Past Surgical History:  ?Procedure Laterality Date  ? NO PAST SURGERIES    ? THYROIDECTOMY N/A 04/19/2017  ? Procedure: TOTAL THYROIDECTOMY;  Surgeon: Armandina Gemma, MD;  Location: [facility];  Service: General;  Laterality: N/A;  ? ? ?Family History  ?Problem Relation Age of Onset  ? Diabetes type II Mother   ? Breast cancer Mother   ? Hypertension Mother   ? Thyroid disease Neg Hx   ? ? ?Social History  ? ?Socioeconomic History  ? Marital status: Married  ?  Spouse name: Not on file  ? Number of children: Not on file  ? Years of education: Not on file  ? Highest education level: Not on file  ?Occupational History  ? Not on file  ?Tobacco Use  ? Smoking status: Never  ? Smokeless tobacco: Never  ?Vaping Use  ? Vaping Use: Never used  ?Substance and Sexual Activity  ? Alcohol use: No  ? Drug use: No  ? Sexual activity: Yes  ?Other Topics Concern  ? Not on file  ?Social History Narrative  ? Not on file  ? ?Social Determinants of Health  ? ?Financial Resource Strain: Not on file  ?Food Insecurity: Not on file  ?Transportation Needs: Not on file  ?Physical Activity: Not on file  ?Stress: Not on file  ?Social Connections: Not on file  ?Intimate Partner Violence: Not on file  ? ? ?ROS ?Review of Systems  ?All other systems  reviewed and are negative. ? ?Objective:  ? ?Today's Vitals: BP 126/86   Pulse 83   Temp 98 ?F (36.7 ?C) (Oral)   Resp 16   Wt 163 lb 9.6 oz (74.2 kg)   SpO2 97%   BMI 28.98 kg/m?  ? ?Physical Exam ?Vitals and nursing note reviewed.  ?Constitutional:   ?   General: She is not in acute distress. ?Cardiovascular:  ?   Rate and Rhythm: Normal rate and regular rhythm.  ?Pulmonary:  ?   Effort: Pulmonary effort is normal.  ?   Breath sounds: Normal breath sounds.  ?Chest:  ?   Chest wall: No tenderness.  ?Abdominal:  ?   Palpations: Abdomen is soft.  ?   Tenderness: There is no abdominal tenderness.  ?Musculoskeletal:     ?   General: No tenderness. Normal range of motion.  ?Neurological:  ?   General: No focal  deficit present.  ?   Mental Status: She is alert and oriented to person, place, and time.  ? ? ?Assessment & Plan:  ?1. Chest wall pain ?resolved ? ?2. Pain in right upper arm ?resolved ? ?3. Encounter for completion of form with patient ?Form completed for [facility] ? ? ?Outpatient Encounter Medications as of 06/20/2021  ?Medication Sig  ? calcitRIOL (ROCALTROL) 0.25 MCG capsule Take 0.5 mcg by mouth 2 (two) times daily.  ? calcium carbonate (TUMS - DOSED IN MG ELEMENTAL CALCIUM) 500 MG chewable tablet Chew 1 tablet by mouth 3 (three) times daily.  ? cholecalciferol (VITAMIN D3) 25 MCG (1000 UT) tablet Take 2,000 Units by mouth daily.  ? levothyroxine (SYNTHROID) 88 MCG tablet TAKE 1 TABLET BY MOUTH ONCE DAILY BEFORE BREAKFAST  ? methocarbamol (ROBAXIN) 500 MG tablet Take 1 tablet (500 mg total) by mouth in the morning and at bedtime.  ? [DISCONTINUED] cyclobenzaprine (FLEXERIL) 10 MG tablet Take 1 tablet (10 mg total) by mouth 3 (three) times daily as needed for muscle spasms.  ? [DISCONTINUED] Ergocalciferol (VITAMIN D2) 10 MCG (400 UNIT) TABS Vitamin D2  ? [DISCONTINUED] oxyCODONE-acetaminophen (PERCOCET/ROXICET) 5-325 MG tablet Take 1 tablet by mouth every 6 (six) hours as needed for severe pain.  ?  [DISCONTINUED] pantoprazole (PROTONIX) 40 MG tablet Take 1 tablet (40 mg total) by mouth daily.  ? ?No facility-administered encounter medications on file as of 06/20/2021.  ? ? ?Follow-up: No follow-ups on file.  ? ?Becky Sax, MD ? ?

## 2021-06-20 NOTE — Patient Instructions (Addendum)
Continue Levothyroxine 88 mcg daily  ? ?At 2 PM : ?Calcitriol 0.25 mcg two tablets  ?Calcium 2000 mg  ?Vitamin D3 2000 iu daily  ? ? ? ? ?AT 6 pm : ?Calcium 2000 mg  ? ? ? ?At Bedtime : ?Calcitriol 0.25 mcg two tablets  ?Calcium 2000 mg  ?

## 2021-06-20 NOTE — Progress Notes (Signed)
? ? ?Name: Selena Scott  ?MRN/ DOB: [id number], 1980/11/30    ?Age/ Sex: 41 y.o., female   ? ?PCP: Dorna Mai, MD   ?Reason for Endocrinology Evaluation: Postsurgical hypoparathyroidism  ?   ?Date of Initial Endocrinology Evaluation: 06/20/2021   ? ? ?HPI: ?Selena Scott is a 41 y.o. female with a past medical history of postoperative hypothyroidism and postoperative hypoparathyroidism. The patient presented for initial endocrinology clinic visit on 06/20/2021 for consultative assistance with her postsurgical hypoparathyroidism.  ? ?She is status post total thyroidectomy for multinodular goiter in 03/2017.  The surgery was complicated by prolonged hypocalcemia. ? ? ?She was seen previously by Dr. Loanne Drilling  (2018 ) followed by Dr. Cruzita Lederer (2021)  with [facility] endocrinology followed by Dr. Denton Lank  (2022) with [facility] ? ? ?She does have back spasm as well as paresthesias in the hands and feet.  She takes Tums when this happens ? ?Historically she has had history with scattered office visits. ? ?Her previous endocrinologist switched times to calcitriol and was considering teriparatide as an off label use given that Naptara was not back on the market. ? ?She waits 2 hours before she eats after taking levothyroxine  ? ?Take calcium 2 Tums 10 AM, 4 pm and bedtime  ? ? ?She continues with muscular issues and chest pain  ?She was evaluated by neurology and her muscular spasms were attributed to calcium  ?She continues with hand and feet tingling  ? ?She has dry skin  ? ? ?Liquid Health 500 mg per 1 ounce ( Calcium with magnesium )  ? ?Home endocrine medications: ?Calcitriol 0.25 mcg , two tablets twice daily ?Tums (1000 mg) 2 tabs twice daily ?Vitamin D 1000 IU , two daily  ?Levothyroxine 88 mcg daily ? ? ? ? ? ?HISTORY:  ?Past Medical History:  ?Past Medical History:  ?Diagnosis Date  ? Anemia   ? Fibroid   ? GERD (gastroesophageal reflux disease)   ? hx of   ? HPV (human papilloma virus) anogenital infection   ?  Medical history non-contributory   ? Thyroid disease   ? ?Past Surgical History:  ?Past Surgical History:  ?Procedure Laterality Date  ? NO PAST SURGERIES    ? THYROIDECTOMY N/A 04/19/2017  ? Procedure: TOTAL THYROIDECTOMY;  Surgeon: Armandina Gemma, MD;  Location: [facility];  Service: General;  Laterality: N/A;  ?  ?Social History:  reports that she has never smoked. She has never used smokeless tobacco. She reports that she does not drink alcohol and does not use drugs. ?Family History: family history includes Breast cancer in her mother; Diabetes type II in her mother; Hypertension in her mother. ? ? ?HOME MEDICATIONS: ?Allergies as of 06/20/2021   ?No Known Allergies ?  ? ?  ?Medication List  ?  ? ?  ? Accurate as of June 20, 2021  1:12 PM. If you have any questions, ask your nurse or doctor.  ?  ?  ? ?  ? ?STOP taking these medications   ? ?cyclobenzaprine 10 MG tablet ?Commonly known as: FLEXERIL ?Stopped by: Dorita Sciara, MD ?  ?oxyCODONE-acetaminophen 5-325 MG tablet ?Commonly known as: PERCOCET/ROXICET ?Stopped by: Dorita Sciara, MD ?  ?pantoprazole 40 MG tablet ?Commonly known as: PROTONIX ?Stopped by: Dorita Sciara, MD ?  ?Vitamin D2 10 MCG (400 UNIT) Tabs ?Stopped by: Dorita Sciara, MD ?  ? ?  ? ?TAKE these medications   ? ?calcitRIOL 0.25 MCG capsule ?Commonly known as: ROCALTROL ?  Take 0.5 mcg by mouth 2 (two) times daily. ?  ?calcium carbonate 500 MG chewable tablet ?Commonly known as: TUMS - dosed in mg elemental calcium ?Chew 1 tablet by mouth 3 (three) times daily. ?  ?cholecalciferol 25 MCG (1000 UNIT) tablet ?Commonly known as: VITAMIN D3 ?Take 2,000 Units by mouth daily. ?  ?levothyroxine 88 MCG tablet ?Commonly known as: SYNTHROID ?TAKE 1 TABLET BY MOUTH ONCE DAILY BEFORE BREAKFAST ?  ?methocarbamol 500 MG tablet ?Commonly known as: ROBAXIN ?Take 1 tablet (500 mg total) by mouth in the morning and at bedtime. ?  ? ?  ?  ? ? ?REVIEW OF SYSTEMS: ?A comprehensive ROS was  conducted with the patient and is negative except as per HPI  ? ? ?OBJECTIVE:  ?VS: BP 120/70 (BP Location: Left Arm, Patient Position: Sitting, Cuff Size: Small)   Pulse 82   Ht '5\' 3"'$  (1.6 m)   Wt 163 lb 9.6 oz (74.2 kg)   SpO2 96%   BMI 28.98 kg/m?   ? ?Wt Readings from Last 3 Encounters:  ?06/20/21 163 lb 9.6 oz (74.2 kg)  ?06/16/21 162 lb (73.5 kg)  ?05/25/21 164 lb 9.6 oz (74.7 kg)  ? ? ? ?EXAM: ?General: Pt appears well and is in NAD ?Chvostek's sign negative   ?Neck: General: Supple without adenopathy. ?Thyroid: Thyroid size normal.  No goiter or nodules appreciated.  ?Lungs: Clear with good BS bilat with no rales, rhonchi, or wheezes  ?Heart: Auscultation: RRR.  ?Abdomen: Normoactive bowel sounds, soft, nontender, without masses or organomegaly palpable  ?Extremities:  ?BL LE: No pretibial edema normal ROM and strength.  ?Mental Status: Judgment, insight: Intact ?Orientation: Oriented to time, place, and person ?Mood and affect: No depression, anxiety, or agitation  ? ? ? ?DATA REVIEWED: ? Latest Reference Range & Units 06/20/21 13:27  ?Sodium 135 - 145 mEq/L 139  ?Potassium 3.5 - 5.1 mEq/L 4.3  ?Chloride 96 - 112 mEq/L 104  ?CO2 19 - 32 mEq/L 27  ?Glucose 70 - 99 mg/dL 86  ?BUN 6 - 23 mg/dL 10  ?Creatinine 0.40 - 1.20 mg/dL 0.97  ?Calcium 8.4 - 10.5 mg/dL 8.3 (L)  ?Phosphorus 2.3 - 4.6 mg/dL 4.8 (H)  ?Magnesium 1.5 - 2.5 mg/dL 1.8  ?Alkaline Phosphatase 39 - 117 U/L 56  ?Albumin 3.5 - 5.2 g/dL 4.4  ?AST 0 - 37 U/L 12  ?ALT 0 - 35 U/L 10  ?Total Protein 6.0 - 8.3 g/dL 7.1  ?Total Bilirubin 0.2 - 1.2 mg/dL 0.3  ?GFR >60.00 mL/min 71.75  ? ? Latest Reference Range & Units 06/20/21 13:27  ?VITD 30.00 - 100.00 ng/mL 31.53  ? ?  ? Latest Reference Range & Units 06/20/21 13:27  ?PTH, Intact 16 - 77 pg/mL <6 (L)  ?TSH 0.35 - 5.50 uIU/mL 2.11  ? ? ?ASSESSMENT/PLAN/RECOMMENDATIONS:  ? ?Post -operative hypothyroidism  ? ?-Patient with nonspecific symptoms ?-No local neck symptoms ?- Pt educated extensively on the  correct way to take levothyroxine (first thing in the morning with water, 30 minutes before eating or taking other medications). ?- Pt encouraged to double dose the following day if she were to miss a dose given long half-life of levothyroxine. ?-TSH normal ? ? ?Medication ?Continue levothyroxine 88 mcg daily ? ? ? ?2. Postoperative hypocalcemia: ? ? ?-Patient with chronic hypocalcemia for years since surgery, over the years she has been having difficulties with keeping her calcium within the normal range ?-Today she would like to try a different source of calcium  that her colleague has been using and it has helped her replace calcium, vitamin D, and calcitriol. ?-I explained to the patient that it is important for her to stay on the calcitriol at this time and we will have to make adjustments as we monitor her blood work ?-The goals of therapy in patients with hypoparathyroidism are to relieve symptoms, to raise and maintain the serum calcium concentration in the low-normal range( 8.0 to 9 mg/dL), and to prevent iatrogenic development of kidney stones. Attainment of higher serum calcium values is not necessary and is usually limited by the development of hypercalciuria due to the loss of renal calcium-retaining effects of parathyroid hormone . ? ?-Calcitriol is the vitamin D metabolite of choice because it does not require renal activation, it has a rapid onset of action (hours), and a shorter half-life. ? ?-The major side effects of calcium and vitamin D replacement in patients with hypoparathyroidism are hypercalcemia and hypercalciuria, which, if chronic, can cause nephrolithiasis, nephrocalcinosis, and renal failure. Hypercalciuria is the earliest sign of toxicity and can develop in the absence of hypercalcemia.  ? ?-She is currently on Tums 1000 mg, she takes 2 tablets 3 times daily (2000 mg 3 times daily), I am okay with her trying the liquid health calcium but she will have to consume 4 fluid ounces to reach  2000 mg of calcium  (1 fluid mL contains 500 mg).  I would personally prefer that she takes Caltrate 600 mg, but for now we will try the liquid health calcium and if that does not work we will switch to Caltrate

## 2021-06-21 ENCOUNTER — Encounter: Payer: Self-pay | Admitting: Family Medicine

## 2021-06-21 LAB — TSH: TSH: 2.11 u[IU]/mL (ref 0.35–5.50)

## 2021-06-21 LAB — VITAMIN D 25 HYDROXY (VIT D DEFICIENCY, FRACTURES): VITD: 31.53 ng/mL (ref 30.00–100.00)

## 2021-06-21 LAB — PARATHYROID HORMONE, INTACT (NO CA): PTH: <6 pg/mL — ABNORMAL LOW (ref 16–77)

## 2021-06-21 MED ORDER — VITAMIN D 25 MCG (1000 UNIT) PO TABS: 2000.0000 [IU] | ORAL_TABLET | Freq: Every day | ORAL | 2 refills | Status: AC

## 2021-06-21 MED ORDER — LEVOTHYROXINE SODIUM 88 MCG PO TABS: 88.0000 ug | ORAL_TABLET | Freq: Every day | ORAL | 3 refills | Status: DC

## 2021-06-21 MED ORDER — CALCITRIOL 0.25 MCG PO CAPS: 0.5000 ug | ORAL_CAPSULE | Freq: Two times a day (BID) | ORAL | 2 refills | Status: DC

## 2021-06-23 ENCOUNTER — Ambulatory Visit (HOSPITAL_BASED_OUTPATIENT_CLINIC_OR_DEPARTMENT_OTHER): Payer: No Typology Code available for payment source | Admitting: Orthopaedic Surgery

## 2021-06-23 DIAGNOSIS — M25511 Pain in right shoulder: Secondary | ICD-10-CM

## 2021-06-23 NOTE — Progress Notes (Signed)
? ?                            ? ? ?Chief Complaint: Scapular pain ?  ? ? ?History of Present Illness:  ? ?06/23/2021: Presents today feeling overall significantly better.  She has been taking the Robaxin denies any additional pain at today's visit.  Overall she is feeling much better and ready to return to work.  She does have a physical therapy depending on 20 April. ? ?Selena Scott is a 41 y.o. female presents with 2 weeks of acute right periscapular shoulder girdle pain.  She not have any specific injury or incident.  She states that she is experiencing a deep muscular spasm predominantly in the serratus anterior distribution but also behind and medial to the scapula and in the upper trapezius.  She was given prednisone in the emergency room which only helped transiently.  The she is taking Tylenol and ibuprofen with limited relief.  She works in [place].  She continues to experience significant muscle spasms.  She does have a history of thyroid and electrolyte abnormalities which are currently being managed with a new endocrinologist. ? ? ? ?Surgical History:   ?None ? ?PMH/PSH/Family History/Social History/Meds/Allergies:   ? ?Past Medical History:  ?Diagnosis Date  ? Anemia   ? Fibroid   ? GERD (gastroesophageal reflux disease)   ? hx of   ? HPV (human papilloma virus) anogenital infection   ? Medical history non-contributory   ? Thyroid disease   ? ?Past Surgical History:  ?Procedure Laterality Date  ? NO PAST SURGERIES    ? THYROIDECTOMY N/A 04/19/2017  ? Procedure: TOTAL THYROIDECTOMY;  Surgeon: Armandina Gemma, MD;  Location: [facility];  Service: General;  Laterality: N/A;  ? ?Social History  ? ?Socioeconomic History  ? Marital status: Married  ?  Spouse name: Not on file  ? Number of children: Not on file  ? Years of education: Not on file  ? Highest education level: Not on file  ?Occupational History  ? Not on file  ?Tobacco Use  ? Smoking status: Never  ? Smokeless tobacco: Never  ?Vaping Use  ? Vaping Use: Never  used  ?Substance and Sexual Activity  ? Alcohol use: No  ? Drug use: No  ? Sexual activity: Yes  ?Other Topics Concern  ? Not on file  ?Social History Narrative  ? Not on file  ? ?Social Determinants of Health  ? ?Financial Resource Strain: Not on file  ?Food Insecurity: Not on file  ?Transportation Needs: Not on file  ?Physical Activity: Not on file  ?Stress: Not on file  ?Social Connections: Not on file  ? ?Family History  ?Problem Relation Age of Onset  ? Diabetes type II Mother   ? Breast cancer Mother   ? Hypertension Mother   ? Thyroid disease Neg Hx   ? ?No Known Allergies ?Current Outpatient Medications  ?Medication Sig Dispense Refill  ? calcitRIOL (ROCALTROL) 0.25 MCG capsule Take 2 capsules (0.5 mcg total) by mouth 2 (two) times daily. 360 capsule 2  ? cholecalciferol (VITAMIN D3) 25 MCG (1000 UNIT) tablet Take 2 tablets (2,000 Units total) by mouth daily. 180 tablet 2  ? levothyroxine (SYNTHROID) 88 MCG tablet Take 1 tablet (88 mcg total) by mouth daily before breakfast. 90 tablet 3  ? methocarbamol (ROBAXIN) 500 MG tablet Take 1 tablet (500 mg total) by mouth in the morning and at bedtime. 30 tablet  3  ? ?No current facility-administered medications for this visit.  ? ?No results found. ? ?Review of Systems:   ?A ROS was performed including pertinent positives and negatives as documented in the HPI. ? ?Physical Exam :   ?Constitutional: NAD and appears stated age ?Neurological: Alert and oriented ?Psych: Appropriate affect and cooperative ?There were no vitals taken for this visit.  ? ?Comprehensive Musculoskeletal Exam:   ? ?Musculoskeletal Exam    ?Inspection Right Left  ?Skin No atrophy or winging No atrophy or winging  ?Palpation    ?Tenderness None ? None  ?Range of Motion    ?Flexion (passive) 170 170  ?Flexion (active) 170 170  ?Abduction 170 170  ?ER at the side 70 70  ?Can reach behind back to T12 T12  ?Strength    ? full None  ?Special Tests    ?Pseudoparalytic No No  ?Neurologic    ?Fires  PIN, radial, median, ulnar, musculocutaneous, axillary, suprascapular, long thoracic, and spinal accessory innervated muscles. No abnormal sensibility  ?Vascular/Lymphatic    ?Radial Pulse 2+ 2+  ?Cervical Exam    ?Patient has symmetric cervical range of motion with negative Spurling's test.  ?Special Test:   ? ? ? ?Imaging:   ?Xray (3 views right shoulder, 2 views are clavicle): ?Normal ? ? ?I personally reviewed and interpreted the radiographs. ? ? ?Assessment:   ?41 year old female with right periscapular shoulder girdle type pain which I believe is a result of muscular spasm particularly in the serratus anterior.  I would like her to stay on Robaxin for an additional 2 weeks.  At that time she will plan to wean off.  If her pain is completely resolved at that time I have asked that she can cancel her physical therapy appointment with advance notice for the therapist.  If his pain recurs I do believe that she would occur for some periscapular exercises and strengthening ?Plan :   ? ?-Return to clinic as needed ? ? ? ? ?I personally saw and evaluated the patient, and participated in the management and treatment plan. ? ?Vanetta Mulders, MD ?Attending Physician, Orthopedic Surgery ? ?This document was dictated using Systems analyst. A reasonable attempt at proof reading has been made to minimize errors. ?

## 2021-07-14 ENCOUNTER — Ambulatory Visit (HOSPITAL_BASED_OUTPATIENT_CLINIC_OR_DEPARTMENT_OTHER): Payer: No Typology Code available for payment source | Admitting: Physical Therapy

## 2021-07-28 ENCOUNTER — Ambulatory Visit (HOSPITAL_BASED_OUTPATIENT_CLINIC_OR_DEPARTMENT_OTHER): Payer: No Typology Code available for payment source | Admitting: Nurse Practitioner

## 2021-07-28 ENCOUNTER — Encounter (HOSPITAL_BASED_OUTPATIENT_CLINIC_OR_DEPARTMENT_OTHER): Payer: Self-pay | Admitting: Nurse Practitioner

## 2021-07-28 ENCOUNTER — Other Ambulatory Visit (HOSPITAL_BASED_OUTPATIENT_CLINIC_OR_DEPARTMENT_OTHER): Payer: Self-pay

## 2021-07-28 VITALS — BP 122/72 | HR 77 | Ht 63.0 in | Wt 164.4 lb

## 2021-07-28 DIAGNOSIS — E208 Other hypoparathyroidism: Secondary | ICD-10-CM

## 2021-07-28 DIAGNOSIS — E890 Postprocedural hypothyroidism: Secondary | ICD-10-CM

## 2021-07-28 MED ORDER — CALCITRIOL 0.25 MCG PO CAPS
0.5000 ug | ORAL_CAPSULE | Freq: Two times a day (BID) | ORAL | 3 refills | Status: DC
  Filled 2021-07-28: qty 100, 25d supply, fill #0

## 2021-07-28 NOTE — Patient Instructions (Signed)
Thank you for choosing [facility] for your Primary Care needs. I am excited for the opportunity to partner with you to meet your health care goals. It was a pleasure meeting you today! ? ?Recommendations from today's visit: ?I am going to look through your chart and make sure I am up to date on all that is going on. I am also going to do some research on your thyroid medications.  ? ?Information on diet, exercise, and health maintenance recommendations are listed below. This is information to help you be sure you are on track for optimal health and monitoring.  ? ?Please look over this and let us know if you have any questions or if you have completed any of the health maintenance outside of [facility] so that we can be sure your records are up to date.  ?___________________________________________________________ ?About Me: ?I am an Adult-Geriatric Nurse Practitioner with a background in caring for patients for more than 20 years with a strong intensive care background. I provide primary care and sports medicine services to patients age 69 and older within this office. My education had a strong focus on caring for the older adult population, which I am passionate about. I am also the director of the APP Fellowship with [facility].  ? ?My desire is to provide you with the best service through preventive medicine and supportive care. I consider you a part of the medical team and value your input. I work diligently to ensure that you are heard and your needs are met in a safe and effective manner. I want you to feel comfortable with me as your provider and want you to know that your health concerns are important to me. ? ?For your information, our office hours are: ?Monday, Tuesday, and Thursday 8:00 AM - 5:00 PM ?Wednesday and Friday 8:00 AM - 12:00 PM.  ? ?In my time away from the office I am teaching new APP's within the system and am unavailable, but my partner, Dr. Burnard Bunting is  in the office for emergent needs.  ? ?If you have questions or concerns, please call our office at [phone] or send [place] a MyChart message and we will respond as quickly as possible.  ?____________________________________________________________ ?MyChart:  ?For all urgent or time sensitive needs we ask that you please call the office to avoid delays. Our number is [phone]. ?MyChart is not constantly monitored and due to the large volume of messages a day, replies may take up to 72 business hours. ? ?MyChart Policy: ?MyChart allows for you to see your visit notes, after visit summary, provider recommendations, lab and tests results, make an appointment, request refills, and contact your provider or the office for non-urgent questions or concerns. Providers are seeing patients during normal business hours and do not have built in time to review MyChart messages.  ?We ask that you allow a minimum of 3 business days for responses to [employer]. For this reason, please do not send urgent requests through [facility]. Please call the office at [phone]. ?New and ongoing conditions may require a visit. We have virtual and in person visit available for your convenience.  ?Complex MyChart concerns may require a visit. Your provider may request you schedule a virtual or in person visit to ensure we are providing the best care possible. ?MyChart messages sent after 11:00 AM on Friday will not be received by the provider until Monday morning.  ?  ?Lab and Test Results: ?You will  receive your lab and test results on MyChart as soon as they are completed and results have been sent by the lab or testing facility. Due to this service, you will receive your results BEFORE your provider.  ?I review lab and tests results each morning prior to seeing patients. Some results require collaboration with other providers to ensure you are receiving the most appropriate care. For this reason, we ask that you please allow a  minimum of 3-5 business days from the time the ALL results have been received for your provider to receive and review lab and test results and contact you about these.  ?Most lab and test result comments from the provider will be sent through [place]. Your provider may recommend changes to the plan of care, follow-up visits, repeat testing, ask questions, or request an office visit to discuss these results. You may reply directly to this message or call the office at [phone] to provide information for the provider or set up an appointment. ?In some instances, you will be called with test results and recommendations. Please let us know if this is preferred and we will make note of this in your chart to provide this for you.    ?If you have not heard a response to your lab or test results in 5 business days from all results returning to [facility], please call the office to let us know. We ask that you please avoid calling prior to this time unless there is an emergent concern. Due to high call volumes, this can delay the resulting process. ? ?After Hours: ?For all non-emergency after hours needs, please call the office at [phone] and select the option to reach the on-call provider service. On-call services are shared between multiple [facility] offices and therefore it will not be possible to speak directly with your provider. On-call providers may provide medical advice and recommendations, but are unable to provide refills for maintenance medications.  ?For all emergency or urgent medical needs after normal business hours, we recommend that you seek care at the closest Urgent Care or Emergency Department to ensure appropriate treatment in a timely manner.  ?[facility] has a 24 hour emergency room located on the ground floor for your convenience.  ? ?Urgent Concerns During the Business Day ?Providers are seeing patients from 8AM to [place] with a busy schedule and are most often not able  to respond to non-urgent calls until the end of the day or the next business day. ?If you should have URGENT concerns during the day, please call and speak to the nurse or schedule a same day appointment so that we can address your concern without delay.  ? ?Thank you, again, for choosing me as your health care partner. I appreciate your trust and look forward to learning more about you.  ? ?Worthy Keeler, DNP, AGNP-c ?___________________________________________________________ ? ?Health Maintenance Recommendations ?Screening Testing ?Mammogram ?Every 1 -2 years based on history and risk factors ?Starting at age 51 ?Pap Smear ?Ages 21-39 every 3 years ?Ages 94-65 every 5 years with HPV testing ?More frequent testing may be required based on results and history ?Colon Cancer Screening ?Every 1-10 years based on test performed, risk factors, and history ?Starting at age 71 ?Bone Density Screening ?Every 2-10 years based on history ?Starting at age 68 for women ?Recommendations for men differ based on medication usage, history, and risk factors ?AAA Screening ?One time ultrasound ?Men 54-71 years old who have every smoked ?Lung Cancer Screening ?Low  Dose Lung CT every 12 months ?Age 64-80 years with a 30 pack-year smoking history who still smoke or who have quit within the last 15 years ? ?Screening Labs ?Routine  Labs: Complete Blood Count (CBC), Complete Metabolic Panel (CMP), Cholesterol (Lipid Panel) ?Every 6-12 months based on history and medications ?May be recommended more frequently based on current conditions or previous results ?Hemoglobin A1c Lab ?Every 3-12 months based on history and previous results ?Starting at age 40 or earlier with diagnosis of diabetes, high cholesterol, BMI >26, and/or risk factors ?Frequent monitoring for patients with diabetes to ensure blood sugar control ?Thyroid Panel (TSH w/ T3 & T4) ?Every 6 months based on history, symptoms, and risk factors ?May be repeated more often if on  medication ?HIV ?One time testing for all patients 63 and older ?May be repeated more frequently for patients with increased risk factors or exposure ?Hepatitis C ?One time testing for all patients 18 an

## 2021-07-28 NOTE — Progress Notes (Signed)
?Orma Render, DNP, AGNP-c ?Primary Care & Sports Medicine ?[place], [place] [address] ?[phone] ? ?New patient visit ? ? ?Patient: Selena Scott   DOB: 18-Sep-1980   41 y.o. Female  MRN: [id number] ?Visit Date: 07/28/2021 ? ?Patient Care Team: ?Aqueelah Cotrell, Coralee Pesa, NP as PCP - General (Nurse Practitioner) ? ?Today's Vitals  ? 07/28/21 1417  ?BP: 122/72  ?Pulse: 77  ?SpO2: 97%  ?Weight: 164 lb 6.4 oz (74.6 kg)  ?Height: '5\' 3"'$  (1.6 m)  ? ?Body mass index is 29.12 kg/m?.  ? ?Today's healthcare provider: Orma Render, NP  ? ?No chief complaint on file. ? ?Subjective  ?  ?Selena Scott is a 41 y.o. female who presents today as a new patient to establish care.  ?  ?Patient endorses the following concerns presently: ?Postsurgical hypothyroidism  ?-Currently seeing endocrinology for management of her symptoms ?-She is taking levothyroxine and calcitriol for treatment as well as vitamin D replacement. ?-She reports that she is waiting 4 hours past her levothyroxine dose before she eats or take any other medication as advised by endocrinology. ?-She endorses chronic muscular and chest wall pain-it was determined that chest wall pain was attributed to low calcium levels ?-She is also experiencing some paresthesias in her hands and feet ?-She currently has dry skin ? ?History reviewed and reveals the following: ?Past Medical History:  ?Diagnosis Date  ? Anemia   ? Fibroid   ? GERD (gastroesophageal reflux disease)   ? hx of   ? HPV (human papilloma virus) anogenital infection   ? Medical history non-contributory   ? Thyroid disease   ? ?Past Surgical History:  ?Procedure Laterality Date  ? NO PAST SURGERIES    ? THYROIDECTOMY N/A 04/19/2017  ? Procedure: TOTAL THYROIDECTOMY;  Surgeon: Armandina Gemma, MD;  Location: [facility];  Service: General;  Laterality: N/A;  ? ?Family Status  ?Relation Name Status  ? Mother  Alive  ? Father  Alive  ? Neg Hx  (Not Specified)  ? ?Family History   ?Problem Relation Age of Onset  ? Diabetes type II Mother   ? Breast cancer Mother   ? Hypertension Mother   ? Thyroid disease Neg Hx   ? ?Social History  ? ?Socioeconomic History  ? Marital status: Married  ?  Spouse name: Not on file  ? Number of children: Not on file  ? Years of education: Not on file  ? Highest education level: Not on file  ?Occupational History  ? Not on file  ?Tobacco Use  ? Smoking status: Never  ? Smokeless tobacco: Never  ?Vaping Use  ? Vaping Use: Never used  ?Substance and Sexual Activity  ? Alcohol use: No  ? Drug use: No  ? Sexual activity: Yes  ?Other Topics Concern  ? Not on file  ?Social History Narrative  ? Not on file  ? ?Social Determinants of Health  ? ?Financial Resource Strain: Not on file  ?Food Insecurity: Not on file  ?Transportation Needs: Not on file  ?Physical Activity: Not on file  ?Stress: Not on file  ?Social Connections: Not on file  ? ?Outpatient Medications Prior to Visit  ?Medication Sig  ? cholecalciferol (VITAMIN D3) 25 MCG (1000 UNIT) tablet Take 2 tablets (2,000 Units total) by mouth daily.  ? levothyroxine (SYNTHROID) 88 MCG tablet Take 1 tablet (88 mcg total) by mouth daily before breakfast.  ? [DISCONTINUED] calcitRIOL (ROCALTROL) 0.25 MCG capsule Take 2 capsules (  0.5 mcg total) by mouth 2 (two) times daily.  ? methocarbamol (ROBAXIN) 500 MG tablet Take 1 tablet (500 mg total) by mouth in the morning and at bedtime. (Patient not taking: Reported on 07/28/2021)  ? ?No facility-administered medications prior to visit.  ? ?No Known Allergies ?There is no immunization history for the selected administration types on file for this patient. ? ?Review of Systems ?All review of systems negative except what is listed in the HPI ? ? Objective  ?  ?BP 122/72   Pulse 77   Ht '5\' 3"'$  (1.6 m)   Wt 164 lb 6.4 oz (74.6 kg)   SpO2 97%   BMI 29.12 kg/m?  ?Physical Exam ?Vitals and nursing note reviewed.  ?Constitutional:   ?   Appearance: Normal appearance.  ?HENT:  ?    Head: Normocephalic.  ?Eyes:  ?   Extraocular Movements: Extraocular movements intact.  ?   Conjunctiva/sclera: Conjunctivae normal.  ?   Pupils: Pupils are equal, round, and reactive to light.  ?Neck:  ?   Vascular: No carotid bruit.  ?Cardiovascular:  ?   Rate and Rhythm: Normal rate and regular rhythm.  ?   Pulses: Normal pulses.  ?   Heart sounds: No murmur heard. ?Pulmonary:  ?   Effort: Pulmonary effort is normal.  ?   Breath sounds: Normal breath sounds.  ?Abdominal:  ?   General: Bowel sounds are normal.  ?   Palpations: Abdomen is soft.  ?Musculoskeletal:     ?   General: Tenderness present.  ?   Cervical back: Normal range of motion. No tenderness.  ?   Comments: Chest wall tenderness is present with deep palpation.  ?Lymphadenopathy:  ?   Cervical: No cervical adenopathy.  ?Skin: ?   General: Skin is warm and dry.  ?Neurological:  ?   General: No focal deficit present.  ?   Mental Status: She is alert and oriented to person, place, and time.  ?   Cranial Nerves: No cranial nerve deficit.  ?   Sensory: No sensory deficit.  ?   Motor: No weakness.  ?   Gait: Gait normal.  ?Psychiatric:     ?   Mood and Affect: Mood normal.     ?   Behavior: Behavior normal.     ?   Thought Content: Thought content normal.     ?   Judgment: Judgment normal.  ? ? ?No results found for any visits on 07/28/21. ? Assessment & Plan   ?  ? ?Problem List Items Addressed This Visit   ? ? Iatrogenic hypocalcemia  ?  Continue with current dose of calcitriol and other calcium supplementations recommended by endocrinology.  We will continue to monitor closely. ?Recommend taking calcium supplementation at least 4 hours before or after levothyroxine dosage to allow for appropriate absorption. ?Follow-up if symptoms worsen or fail to improve. ? ?  ?  ? Postsurgical hypothyroidism - Primary  ?  Chronic.  Following with Dr. Kelton Pillar with endocrinology at this time. ?Currently taking calcitriol as well as other calcium supplementation to  help improve her calcium levels as these have been extensively low recently.  I do suspect that the lower levels of calcium are continuing to contribute to her muscle pain.  Recommend continuation with this therapy.  We will send refills in today. ?Patient expressed concerns with waiting for hours past her levothyroxine dose.  Discussed with patient that typically the weight.  Is 30 minutes to 1  hour after taking levothyroxine before any food, drink other than water, or other medication.  Encourage patient to speak with Dr. Link Snuffer let her. ?I did review chart today and it shows depression levers recommendations are 30 minutes as well.  This was likely a misunderstanding as it is typically a recommendation to wait 4 hours after taking levothyroxine and calcium/vitamin supplementation. ?We will plan to reach out to patient to ensure that she is understanding of this. ?We will continue to collaborate care with Dr. Link Snuffer liver.  Patient aware to reach out with any new or worsening symptoms. ? ?  ?  ? Other hypoparathyroidism ([place])  ? ? ? ?Return for TBD.  ?  ? ? ?Mats Jeanlouis, Coralee Pesa, NP, DNP, AGNP-C ?Primary Care & Sports Medicine at [facility] ?[facility]  ? ?

## 2021-08-04 ENCOUNTER — Other Ambulatory Visit (HOSPITAL_BASED_OUTPATIENT_CLINIC_OR_DEPARTMENT_OTHER): Payer: Self-pay

## 2021-08-04 DIAGNOSIS — E208 Other hypoparathyroidism: Secondary | ICD-10-CM

## 2021-08-04 MED ORDER — CALCITRIOL 0.5 MCG PO CAPS: 0.5000 ug | ORAL_CAPSULE | Freq: Every day | ORAL | 3 refills | Status: DC

## 2021-08-05 ENCOUNTER — Encounter (HOSPITAL_BASED_OUTPATIENT_CLINIC_OR_DEPARTMENT_OTHER): Payer: Self-pay | Admitting: Nurse Practitioner

## 2021-08-05 ENCOUNTER — Other Ambulatory Visit: Payer: No Typology Code available for payment source

## 2021-08-05 DIAGNOSIS — E208 Other hypoparathyroidism: Secondary | ICD-10-CM

## 2021-08-05 NOTE — Assessment & Plan Note (Addendum)
Chronic.  Following with Dr. Kelton Pillar with endocrinology at this time. ?Currently taking calcitriol as well as other calcium supplementation to help improve her calcium levels as these have been extensively low recently.  I do suspect that the lower levels of calcium are continuing to contribute to her muscle pain.  Recommend continuation with this therapy.  We will send refills in today. ?Patient expressed concerns with waiting for hours past her levothyroxine dose.  Discussed with patient that typically the weight.  Is 30 minutes to 1 hour after taking levothyroxine before any food, drink other than water, or other medication.  Encourage patient to speak with Dr. Link Snuffer let her. ?I did review chart today and it shows depression levers recommendations are 30 minutes as well.  This was likely a misunderstanding as it is typically a recommendation to wait 4 hours after taking levothyroxine and calcium/vitamin supplementation. ?We will plan to reach out to patient to ensure that she is understanding of this. ?We will continue to collaborate care with Dr. Link Snuffer liver.  Patient aware to reach out with any new or worsening symptoms. ?

## 2021-08-05 NOTE — Assessment & Plan Note (Signed)
Continue with current dose of calcitriol and other calcium supplementations recommended by endocrinology.  We will continue to monitor closely. ?Recommend taking calcium supplementation at least 4 hours before or after levothyroxine dosage to allow for appropriate absorption. ?Follow-up if symptoms worsen or fail to improve. ?

## 2021-08-11 ENCOUNTER — Other Ambulatory Visit (INDEPENDENT_AMBULATORY_CARE_PROVIDER_SITE_OTHER): Payer: No Typology Code available for payment source

## 2021-08-11 DIAGNOSIS — E208 Other hypoparathyroidism: Secondary | ICD-10-CM

## 2021-08-11 DIAGNOSIS — E890 Postprocedural hypothyroidism: Secondary | ICD-10-CM

## 2021-08-11 LAB — ALBUMIN: Albumin: 4.2 g/dL (ref 3.5–5.2)

## 2021-08-11 LAB — TSH: TSH: 1.72 u[IU]/mL (ref 0.35–5.50)

## 2021-08-11 LAB — CALCIUM: Calcium: 8.3 mg/dL — ABNORMAL LOW (ref 8.4–10.5)

## 2021-08-15 MED ORDER — CALCITRIOL 0.5 MCG PO CAPS: 1.0000 ug | ORAL_CAPSULE | Freq: Two times a day (BID) | ORAL | 3 refills | Status: DC

## 2021-08-15 MED ORDER — LEVOTHYROXINE SODIUM 88 MCG PO TABS: 88.0000 ug | ORAL_TABLET | Freq: Every day | ORAL | 3 refills | Status: DC

## 2021-09-23 ENCOUNTER — Ambulatory Visit: Payer: No Typology Code available for payment source | Admitting: Internal Medicine

## 2022-01-03 ENCOUNTER — Ambulatory Visit: Payer: No Typology Code available for payment source | Admitting: Internal Medicine

## 2022-01-05 ENCOUNTER — Ambulatory Visit: Payer: No Typology Code available for payment source | Admitting: Internal Medicine

## 2022-04-14 DIAGNOSIS — E039 Hypothyroidism, unspecified: Secondary | ICD-10-CM | POA: Diagnosis not present

## 2022-04-14 DIAGNOSIS — Z1231 Encounter for screening mammogram for malignant neoplasm of breast: Secondary | ICD-10-CM | POA: Diagnosis not present

## 2022-04-14 DIAGNOSIS — Z202 Contact with and (suspected) exposure to infections with a predominantly sexual mode of transmission: Secondary | ICD-10-CM | POA: Diagnosis not present

## 2022-04-14 DIAGNOSIS — Z1151 Encounter for screening for human papillomavirus (HPV): Secondary | ICD-10-CM | POA: Diagnosis not present

## 2022-04-14 DIAGNOSIS — Z124 Encounter for screening for malignant neoplasm of cervix: Secondary | ICD-10-CM | POA: Diagnosis not present

## 2022-04-14 DIAGNOSIS — Z01411 Encounter for gynecological examination (general) (routine) with abnormal findings: Secondary | ICD-10-CM | POA: Diagnosis not present

## 2022-04-14 DIAGNOSIS — N926 Irregular menstruation, unspecified: Secondary | ICD-10-CM | POA: Diagnosis not present

## 2022-04-14 DIAGNOSIS — Z130 Encounter for screening for diseases of the blood and blood-forming organs and certain disorders involving the immune mechanism: Secondary | ICD-10-CM | POA: Diagnosis not present

## 2022-04-28 DIAGNOSIS — N926 Irregular menstruation, unspecified: Secondary | ICD-10-CM | POA: Diagnosis not present

## 2022-04-28 DIAGNOSIS — N939 Abnormal uterine and vaginal bleeding, unspecified: Secondary | ICD-10-CM | POA: Diagnosis not present

## 2022-04-28 DIAGNOSIS — E890 Postprocedural hypothyroidism: Secondary | ICD-10-CM | POA: Diagnosis not present

## 2022-05-12 ENCOUNTER — Ambulatory Visit: Payer: BLUE CROSS/BLUE SHIELD | Admitting: Internal Medicine

## 2022-05-12 ENCOUNTER — Encounter: Payer: Self-pay | Admitting: Internal Medicine

## 2022-05-12 VITALS — BP 120/80 | HR 88 | Ht 63.0 in | Wt 160.4 lb

## 2022-05-12 DIAGNOSIS — E890 Postprocedural hypothyroidism: Secondary | ICD-10-CM | POA: Diagnosis not present

## 2022-05-12 DIAGNOSIS — E2089 Other specified hypoparathyroidism: Secondary | ICD-10-CM | POA: Diagnosis not present

## 2022-05-12 LAB — VITAMIN D 25 HYDROXY (VIT D DEFICIENCY, FRACTURES): VITD: 37.94 ng/mL (ref 30.00–100.00)

## 2022-05-12 LAB — ALBUMIN: Albumin: 4.1 g/dL (ref 3.5–5.2)

## 2022-05-12 LAB — CALCIUM: Calcium: 8.3 mg/dL — ABNORMAL LOW (ref 8.4–10.5)

## 2022-05-12 MED ORDER — LEVOTHYROXINE SODIUM 100 MCG PO TABS: 100.0000 ug | ORAL_TABLET | Freq: Every day | ORAL | 3 refills | Status: DC

## 2022-05-12 MED ORDER — CALTRATE 600+D PLUS MINERALS 600-800 MG-UNIT PO TABS: 2.0000 | ORAL_TABLET | Freq: Two times a day (BID) | ORAL | 11 refills | Status: AC

## 2022-05-12 MED ORDER — CALCITRIOL 0.5 MCG PO CAPS: 1.0000 ug | ORAL_CAPSULE | Freq: Every day | ORAL | 3 refills | Status: DC

## 2022-05-12 NOTE — Patient Instructions (Addendum)
Increase Levothyroxine 100 mcg daily   Take Calcitriol 0.5 mcg ,2 capsules daily  Take Caltrate 600 mg 2 tablets twice daily  Take Vitamin D3 2000 iu daily

## 2022-05-12 NOTE — Progress Notes (Signed)
Name: Selena Scott  MRN/ DOB: [id number], [birth year]    Age/ Sex: 42 y.o., female    PCP: Early, Coralee Pesa, NP   Reason for Endocrinology Evaluation: Postsurgical hypoparathyroidism     Date of Initial Endocrinology Evaluation: 06/20/2021    HPI: [name] is a 42 y.o. female with a past medical history of postoperative hypothyroidism and postoperative hypoparathyroidism. The patient presented for initial endocrinology clinic visit on 06/20/2021 for consultative assistance with her postsurgical hypoparathyroidism.   She is status post total thyroidectomy for multinodular goiter in 03/2017.  The surgery was complicated by prolonged hypocalcemia.   She was seen previously by Dr. Loanne Drilling  (2018 ) followed by Dr. Cruzita Lederer (2021)  with [facility] endocrinology followed by Dr. Denton Lank  (2022) with [facility]   Her previous endocrinologist switched  to calcitriol and was considering teriparatide as an off label use given that N[employer] was not back on the market.    SUBJECTIVE:    Today (05/12/22):  Selena Scott is here for follow-up on hypothyroidism and postsurgical hypocalcemia. She has NOT been to our clinic in 11 months.   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  She would like for me to fill FMLA forms No biotin   Home endocrine medications: Calcitriol 0.5 mcg , two caps once daily  Tums (1000 mg) 2 tabs twice daily Vitamin D 1000 IU , two daily  Levothyroxine 88 mcg daily      HISTORY:  Past Medical History:  Past Medical History:  Diagnosis Date   Anemia    Fibroid    GERD (gastroesophageal reflux disease)    hx of    HPV (human papilloma virus) anogenital infection    Medical history  non-contributory    Thyroid disease    Past Surgical History:  Past Surgical History:  Procedure Laterality Date   NO PAST SURGERIES     THYROIDECTOMY N/A 04/19/2017   Procedure: TOTAL THYROIDECTOMY;  Surgeon: Armandina Gemma, MD;  Location: [facility];  Service: General;  Laterality: N/A;    Social History:  reports that she has never smoked. She has never used smokeless tobacco. She reports that she does not drink alcohol and does not use drugs. Family History: family history includes Breast cancer in her mother; Diabetes type II in her mother; Hypertension in her mother.   HOME MEDICATIONS: Allergies as of 05/12/2022   No Known Allergies      Medication List        Accurate as of May 12, 2022 10:18 AM. If you have any questions, ask your nurse or doctor.          STOP taking these medications    methocarbamol 500 MG tablet Commonly known as: ROBAXIN Stopped by: Dorita Sciara, MD       TAKE these medications    calcitRIOL 0.5 MCG capsule Commonly known as: Rocaltrol Take 2 capsules (1 mcg total) by mouth 2 (two) times daily.   cholecalciferol 25 MCG (1000 UNIT) tablet Commonly known as: VITAMIN D3 Take 2 tablets (2,000 Units total) by mouth daily.   levothyroxine 88 MCG tablet Commonly known as: SYNTHROID Take 1 tablet (88 mcg total) by mouth daily before breakfast.  REVIEW OF SYSTEMS: A comprehensive ROS was conducted with the patient and is negative except as per HPI    OBJECTIVE:  VS: BP 120/80 (BP Location: Left Arm, Patient Position: Sitting, Cuff Size: Small)   Pulse 88   Ht 5' 3"$  (1.6 m)   Wt 160 lb 6.4 oz (72.8 kg)   SpO2 99%   BMI 28.41 kg/m    Wt Readings from Last 3 Encounters:  05/12/22 160 lb 6.4 oz (72.8 kg)  07/28/21 164 lb 6.4 oz (74.6 kg)  06/20/21 163 lb 9.6 oz (74.2 kg)     EXAM: General: Pt appears well and is in NAD Chvostek's sign negative   Neck: General: Supple without adenopathy. Thyroid: Thyroid  size normal.  No goiter or nodules appreciated.  Lungs: Clear with good BS bilat with no rales, rhonchi, or wheezes  Heart: Auscultation: RRR.  Abdomen: Normoactive bowel sounds, soft, nontender, without masses or organomegaly palpable  Extremities:  BL LE: No pretibial edema normal ROM and strength.  Mental Status: Judgment, insight: Intact Orientation: Oriented to time, place, and person Mood and affect: No depression, anxiety, or agitation     DATA REVIEWED:   04/14/2022 TSH 8.88 uIU/mL   Latest Reference Range & Units 06/20/21 13:27  Sodium 135 - 145 mEq/L 139  Potassium 3.5 - 5.1 mEq/L 4.3  Chloride 96 - 112 mEq/L 104  CO2 19 - 32 mEq/L 27  Glucose 70 - 99 mg/dL 86  BUN 6 - 23 mg/dL 10  Creatinine 0.40 - 1.20 mg/dL 0.97  Calcium 8.4 - 10.5 mg/dL 8.3 (L)  Phosphorus 2.3 - 4.6 mg/dL 4.8 (H)  Magnesium 1.5 - 2.5 mg/dL 1.8  Alkaline Phosphatase 39 - 117 U/L 56  Albumin 3.5 - 5.2 g/dL 4.4  AST 0 - 37 U/L 12  ALT 0 - 35 U/L 10  Total Protein 6.0 - 8.3 g/dL 7.1  Total Bilirubin 0.2 - 1.2 mg/dL 0.3  GFR >60.00 mL/min 71.75    Latest Reference Range & Units 06/20/21 13:27  VITD 30.00 - 100.00 ng/mL 31.53      Latest Reference Range & Units 06/20/21 13:27  PTH, Intact 16 - 77 pg/mL <6 (L)  TSH 0.35 - 5.50 uIU/mL 2.11    ASSESSMENT/PLAN/RECOMMENDATIONS:   Post -operative hypothyroidism   -Patient with multiple symptoms -She brought her most recent TSH levels from her gynecologist office which shows elevation at 8.88 u IU/mL, will increase levothyroxine as below -No local neck symptoms - Pt educated extensively on the correct way to take levothyroxine (first thing in the morning with water, 30 minutes before eating or taking other medications). - Pt encouraged to double dose the following day if she were to miss a dose given long half-life of levothyroxine.   Medication Stop levothyroxine 88 mcg daily Start levothyroxine 100 mcg daily    2. Postoperative  hypocalcemia:   -Patient with chronic hypocalcemia for years since surgery, over the years she has been having difficulties with keeping her calcium within the normal range -The goals of therapy in patients with hypoparathyroidism are to relieve symptoms, to raise and maintain the serum calcium concentration in the low-normal range( 8.0 to 9 mg/dL), and to prevent iatrogenic development of kidney stones. Attainment of higher serum calcium values is not necessary and is usually limited by the development of hypercalciuria due to the loss of renal calcium-retaining effects of parathyroid hormone .  -Serum calcium remains at goal and stable, but I have asked the patient to change her  calcium source from Tums to Caltrate as below -FMLA form was filled today  Medication Calcitriol 0.5 mcg, 2 caps daily Caltrate 600 mg, 2 tablets twice daily Vitamin D3 2000 IUs daily    Follow-up in 6 months Labs in 8 weeks  Signed electronically by: Mack Guise, MD  [facility] Endocrinology  [facility] [address] [place], [place] [address] Phone: [phone] FAX: [phone]   CC: Orma Render, NP [address] [place] [address] Phone: [phone] Fax: [phone]   Return to Endocrinology clinic as below: No future appointments.

## 2022-07-07 ENCOUNTER — Other Ambulatory Visit (INDEPENDENT_AMBULATORY_CARE_PROVIDER_SITE_OTHER): Payer: BLUE CROSS/BLUE SHIELD

## 2022-07-07 DIAGNOSIS — E2089 Other specified hypoparathyroidism: Secondary | ICD-10-CM

## 2022-07-07 DIAGNOSIS — E890 Postprocedural hypothyroidism: Secondary | ICD-10-CM | POA: Diagnosis not present

## 2022-07-07 LAB — TSH: TSH: 5.06 u[IU]/mL (ref 0.35–5.50)

## 2022-07-07 LAB — ALBUMIN: Albumin: 4.4 g/dL (ref 3.5–5.2)

## 2022-07-07 LAB — CALCIUM: Calcium: 9.2 mg/dL (ref 8.4–10.5)

## 2022-07-10 ENCOUNTER — Other Ambulatory Visit: Payer: Self-pay | Admitting: Internal Medicine

## 2022-07-10 MED ORDER — LEVOTHYROXINE SODIUM 112 MCG PO TABS: 112.0000 ug | ORAL_TABLET | Freq: Every day | ORAL | 3 refills | Status: DC

## 2022-08-22 DIAGNOSIS — Z113 Encounter for screening for infections with a predominantly sexual mode of transmission: Secondary | ICD-10-CM | POA: Diagnosis not present

## 2022-08-22 DIAGNOSIS — N911 Secondary amenorrhea: Secondary | ICD-10-CM | POA: Diagnosis not present

## 2022-11-24 ENCOUNTER — Ambulatory Visit: Payer: BLUE CROSS/BLUE SHIELD | Admitting: Internal Medicine

## 2022-11-24 ENCOUNTER — Encounter: Payer: Self-pay | Admitting: Internal Medicine

## 2022-11-24 VITALS — BP 120/76 | HR 62 | Ht 63.0 in | Wt 152.0 lb

## 2022-11-24 DIAGNOSIS — E890 Postprocedural hypothyroidism: Secondary | ICD-10-CM

## 2022-11-24 DIAGNOSIS — E2089 Other specified hypoparathyroidism: Secondary | ICD-10-CM

## 2022-11-24 LAB — BASIC METABOLIC PANEL
BUN: 11 mg/dL (ref 6–23)
CO2: 29 mEq/L (ref 19–32)
Calcium: 8.5 mg/dL (ref 8.4–10.5)
Chloride: 103 mEq/L (ref 96–112)
Creatinine, Ser: 0.84 mg/dL (ref 0.40–1.20)
GFR: 84.43 mL/min (ref >60.00)
Glucose, Bld: 77 mg/dL (ref 70–99)
Potassium: 4.4 mEq/L (ref 3.5–5.1)
Sodium: 141 mEq/L (ref 135–145)

## 2022-11-24 LAB — TSH: TSH: 1.05 u[IU]/mL (ref 0.35–5.50)

## 2022-11-24 LAB — ALBUMIN: Albumin: 4.2 g/dL (ref 3.5–5.2)

## 2022-11-24 LAB — VITAMIN D 25 HYDROXY (VIT D DEFICIENCY, FRACTURES): VITD: 54.8 ng/mL (ref 30.00–100.00)

## 2022-11-24 MED ORDER — LEVOTHYROXINE SODIUM 112 MCG PO TABS: 112.0000 ug | ORAL_TABLET | Freq: Every day | ORAL | 3 refills | Status: DC

## 2022-11-24 MED ORDER — CALCITRIOL 0.5 MCG PO CAPS
1.0000 ug | ORAL_CAPSULE | Freq: Every day | ORAL | 3 refills | Status: DC
Start: 2022-11-24 — End: 2024-02-04

## 2022-11-24 NOTE — Patient Instructions (Addendum)
Continue  Levothyroxine 112 mcg daily   Take Calcitriol 0.5 mcg ,2 capsules  with lunch  Take Caltrate 600 mg 2 tablets  with lunch and Supper Take Vitamin D3 2000 iu with Lunch

## 2022-11-24 NOTE — Progress Notes (Signed)
Name: Selena Scott  MRN/ DOB: [id number], Feb 25, 1981    Age/ Sex: 42 y.o., female    PCP: Early, Sung Amabile, NP   Reason for Endocrinology Evaluation: Postsurgical hypoparathyroidism     Date of Initial Endocrinology Evaluation: 06/20/2021    HPI: [name] is a 42 y.o. female with a past medical history of pre-diabetes, postoperative hypothyroidism and postoperative hypoparathyroidism. The patient presented for initial endocrinology clinic visit on 06/20/2021 for consultative assistance with her postsurgical hypoparathyroidism.   She is status post total thyroidectomy for multinodular goiter in 03/2017.  The surgery was complicated by prolonged hypocalcemia.   She was seen previously by Dr. Everardo All  (2018 ) followed by Dr. Elvera Lennox (2021)  with [facility] endocrinology followed by Dr. Kathreen Cosier  (2022) with [facility]   Her previous endocrinologist switched  to calcitriol and was considering teriparatide as an off label use given that [employer] as it was not back on the market.    SUBJECTIVE:    Today (11/24/22):  Selena Scott is here for follow-up on hypothyroidism and postsurgical hypocalcemia. She has NOT been to our clinic in 11 months.    She was started on Metformin by Gyn for pre-diabetes but she developed GI side effects    Denies local neck swelling  Denies palpitations Denies constipation but has bloating   She has noted left sided numbness last week, no dental issues , she stopped Biotin , unfortunately she has been taking calcium carbonate 2 tabs 3 times daily instead of 2 tabs twice daily Has chronic nasal issues  Continues with tingling of the tips  No biotin   Home endocrine medications: Levothyroxine 112 mcg daily Calcitriol 0.5 mcg, 2 caps daily Caltrate 600 mg, 2 tablets twice daily Vitamin D3 2000 IUs daily     HISTORY:  Past Medical History:  Past Medical History:  Diagnosis Date   Anemia    Fibroid    GERD (gastroesophageal reflux disease)     hx of    HPV (human papilloma virus) anogenital infection    Medical history non-contributory    Thyroid disease    Past Surgical History:  Past Surgical History:  Procedure Laterality Date   NO PAST SURGERIES     THYROIDECTOMY N/A 04/19/2017   Procedure: TOTAL THYROIDECTOMY;  Surgeon: Darnell Level, MD;  Location: [facility];  Service: General;  Laterality: N/A;    Social History:  reports that she has never smoked. She has never used smokeless tobacco. She reports that she does not drink alcohol and does not use drugs. Family History: family history includes Breast cancer in her mother; Diabetes type II in her mother; Hypertension in her mother.   HOME MEDICATIONS: Allergies as of 11/24/2022   No Known Allergies      Medication List        Accurate as of November 24, 2022  4:51 PM. If you have any questions, ask your nurse or doctor.          calcitRIOL 0.5 MCG capsule Commonly known as: Rocaltrol Take 2 capsules (1 mcg total) by mouth daily.   Caltrate 600+D Plus Minerals 600-800 MG-UNIT Tabs Take 2 tablets by mouth in the morning and at bedtime.   cholecalciferol 25 MCG (1000 UNIT) tablet Commonly known as: VITAMIN D3 Take 2 tablets (2,000 Units total) by mouth daily.   levothyroxine 112 MCG tablet Commonly known as: SYNTHROID Take 1 tablet (112 mcg total) by mouth daily.   metFORMIN 500 MG 24 hr tablet  Commonly known as: GLUCOPHAGE-XR Take 500 mg by mouth daily with breakfast.          REVIEW OF SYSTEMS: A comprehensive ROS was conducted with the patient and is negative except as per HPI    OBJECTIVE:  VS: BP 120/76 (BP Location: Left Arm, Patient Position: Sitting, Cuff Size: Small)   Pulse 62   Ht 5\' 3"  (1.6 m)   Wt 152 lb (68.9 kg)   SpO2 99%   BMI 26.93 kg/m    Wt Readings from Last 3 Encounters:  11/24/22 152 lb (68.9 kg)  05/12/22 160 lb 6.4 oz (72.8 kg)  07/28/21 164 lb 6.4 oz (74.6 kg)     EXAM: General: Pt appears well and is in  NAD Chvostek's sign negative   Neck: General: Supple without adenopathy. Thyroid: Thyroid size normal.  No goiter or nodules appreciated.  Lungs: Clear with good BS bilat with no rales, rhonchi, or wheezes  Heart: Auscultation: RRR.  Abdomen: Normoactive bowel sounds, soft, nontender, without masses or organomegaly palpable  Extremities:  BL LE: No pretibial edema normal ROM and strength.  Mental Status: Judgment, insight: Intact Orientation: Oriented to time, place, and person Mood and affect: No depression, anxiety, or agitation     DATA REVIEWED:  Latest Reference Range & Units 11/24/22 10:00  Potassium 3.5 - 5.1 mEq/L 4.4  Chloride 96 - 112 mEq/L 103  CO2 19 - 32 mEq/L 29  Glucose 70 - 99 mg/dL 77  BUN 6 - 23 mg/dL 11  Creatinine 4.69 - 6.29 mg/dL 5.28  Calcium 8.4 - 41.3 mg/dL 8.5  Albumin 3.5 - 5.2 g/dL 4.2  GFR >24.40 mL/min 84.43    Latest Reference Range & Units 11/24/22 10:00  VITD 30.00 - 100.00 ng/mL 54.80    Latest Reference Range & Units 11/24/22 10:00  TSH 0.35 - 5.50 uIU/mL 1.05      ASSESSMENT/PLAN/RECOMMENDATIONS:   Post -operative hypothyroidism   - Pt is clinically euthyroid -No local neck symptoms -TSH normal -No change  Medication  Continue levothyroxine 112 mcg daily    2. Postoperative hypocalcemia:   -Patient with chronic hypocalcemia for years since surgery, over the years she has been having difficulties with keeping her calcium within the normal range -The goals of therapy in patients with hypoparathyroidism are to relieve symptoms, to raise and maintain the serum calcium concentration in the low-normal range( 8.0 to 9 mg/dL), and to prevent iatrogenic development of kidney stones. Attainment of higher serum calcium values is not necessary and is usually limited by the development of hypercalciuria due to the loss of renal calcium-retaining effects of parathyroid hormone .  -Serum calcium remains at goal  -Somehow she has been  taking Caltrate 3 times daily, patient will decrease this to 2 tabs twice daily as below  Medication Continue calcitriol 0.5 mcg, 2 caps daily Continue Caltrate 600 mg, 2 tablets twice daily Continue Vitamin D3 2000 IUs daily    Follow-up in 6 months Labs in 8 weeks  Signed electronically by: Lyndle Herrlich, MD  [facility] Endocrinology  [facility] [address] [place] [place], [place] [address] Phone: [phone] FAX: [phone]   CC: Tollie Eth, NP [address] [place] [address] Phone: [phone] Fax: [phone]   Return to Endocrinology clinic as below: Future Appointments  Date Time Provider Department Center  05/25/2023  9:30 AM Margaurite Salido, Konrad Dolores, MD [id number] None

## 2022-12-04 DIAGNOSIS — R7303 Prediabetes: Secondary | ICD-10-CM | POA: Diagnosis not present

## 2022-12-04 DIAGNOSIS — Z113 Encounter for screening for infections with a predominantly sexual mode of transmission: Secondary | ICD-10-CM | POA: Diagnosis not present

## 2023-03-06 ENCOUNTER — Ambulatory Visit
Admission: EM | Admit: 2023-03-06 | Discharge: 2023-03-06 | Disposition: A | Discharge status: Home / Self Care | Payer: BLUE CROSS/BLUE SHIELD | Attending: Family Medicine | Admitting: Family Medicine

## 2023-03-06 DIAGNOSIS — T7840XA Allergy, unspecified, initial encounter: Secondary | ICD-10-CM | POA: Diagnosis not present

## 2023-03-06 MED ORDER — FLUOCINONIDE 0.05 % EX CREA: 1.0000 | TOPICAL_CREAM | Freq: Two times a day (BID) | CUTANEOUS | 0 refills | Status: DC

## 2023-03-06 NOTE — ED Provider Notes (Signed)
[facility]   [id number] 03/06/23 Arrival Time: 1128  ASSESSMENT & PLAN:  1. Allergic reaction, initial encounter    Begin: Meds ordered this encounter  Medications   fluocinonide cream (LIDEX) 0.05 %    Sig: Apply 1 Application topically 2 (two) times daily.    Dispense:  60 g    Refill:  0   Declines IM/PO steroid.  Will follow up with PCP or here if worsening or failing to improve as anticipated. Reviewed expectations re: course of current medical issues. Questions answered. Outlined signs and symptoms indicating need for more acute intervention. Patient verbalized understanding. After Visit Summary given.   SUBJECTIVE:  Kathia Gollihar is a 42 y.o. female who presents with a skin complaint. Bilateral axillae irritation/itching s/p using OTC Nair product. Otherwise well.  OBJECTIVE: Vitals:   03/06/23 1252 03/06/23 1253  BP:  106/77  Pulse:  71  Resp:  16  Temp:  98.7 F (37.1 C)  TempSrc:  Oral  SpO2:  100%  Weight: 72.6 kg   Height: 5\' 3"  (1.6 m)     General appearance: alert; no distress HEENT: [place]; AT Neck: supple with FROM Extremities: no edema; moves all extremities normally Skin: warm and dry; bilateral axillae with skin irritation/erythema Psychological: alert and cooperative; normal mood and affect  No Known Allergies  Past Medical History:  Diagnosis Date   Anemia    Fibroid    GERD (gastroesophageal reflux disease)    hx of    HPV (human papilloma virus) anogenital infection    Medical history non-contributory    Thyroid disease    Social History   Socioeconomic History   Marital status: Married    Spouse name: Not on file   Number of children: Not on file   Years of education: Not on file   Highest education level: Not on file  Occupational History   Not on file  Tobacco Use   Smoking status: Never   Smokeless tobacco: Never  Vaping Use   Vaping status: Never Used  Substance and Sexual Activity   Alcohol use: No    Drug use: No   Sexual activity: Yes  Other Topics Concern   Not on file  Social History Narrative   Not on file   Social Determinants of Health   Financial Resource Strain: Low Risk  (11/03/2020)   Received from [facility], [facility]   Overall Financial Resource Strain (CARDIA)    Difficulty of Paying Living Expenses: Not hard at all  Food Insecurity: No Food Insecurity (11/03/2020)   Received from [facility], [facility]   Hunger Vital Sign    Worried About Running Out of Food in the Last Year: Never true    Ran Out of Food in the Last Year: Never true  Transportation Needs: No Transportation Needs (11/03/2020)   Received from [facility], [facility]   PRAPARE - Transportation    Lack of Transportation (Medical): No    Lack of Transportation (Non-Medical): No  Physical Activity: Unknown (11/03/2020)   Received from [facility], [facility]   Exercise Vital Sign    Days of Exercise per Week: 0 days    Minutes of Exercise per Session: Not on file  Stress: No Stress Concern Present (11/03/2020)   Received from [facility], [facility] - Occupational Stress Questionnaire    Feeling of Stress : Not at all  Social Connections: Unknown (07/27/2021)   Received from [facility], [facility]   Social Network    Social Network: Not on file  Intimate Partner Violence: Unknown (07/01/2021)   Received from [facility], [facility]   HITS    Physically Hurt: Not on file    Insult or Talk Down To: Not on file    Threaten Physical Harm: Not on file    Scream or Curse: Not on file   Family History  Problem Relation Age of Onset   Diabetes type II Mother    Breast cancer Mother    Hypertension Mother    Thyroid disease Neg Hx    Past Surgical History:  Procedure Laterality Date   NO PAST SURGERIES     THYROIDECTOMY N/A 04/19/2017   Procedure: TOTAL THYROIDECTOMY;  Surgeon: Darnell Level, MD;  Location: [facility];   Service: General;  Laterality: Vertis Kelch, MD 03/06/23 1346

## 2023-03-06 NOTE — ED Triage Notes (Signed)
Patient states she used Darene Lamer body cream and her under arms on Saturday, started burning, treated with Hydrocortisone Cream 2.5% without relief, Rash is spreading more, itching.

## 2023-04-20 DIAGNOSIS — Z202 Contact with and (suspected) exposure to infections with a predominantly sexual mode of transmission: Secondary | ICD-10-CM | POA: Diagnosis not present

## 2023-04-20 DIAGNOSIS — R87610 Atypical squamous cells of undetermined significance on cytologic smear of cervix (ASC-US): Secondary | ICD-10-CM | POA: Diagnosis not present

## 2023-04-20 DIAGNOSIS — Z01419 Encounter for gynecological examination (general) (routine) without abnormal findings: Secondary | ICD-10-CM | POA: Diagnosis not present

## 2023-04-20 DIAGNOSIS — Z113 Encounter for screening for infections with a predominantly sexual mode of transmission: Secondary | ICD-10-CM | POA: Diagnosis not present

## 2023-04-20 DIAGNOSIS — Z1231 Encounter for screening mammogram for malignant neoplasm of breast: Secondary | ICD-10-CM | POA: Diagnosis not present

## 2023-04-20 DIAGNOSIS — E890 Postprocedural hypothyroidism: Secondary | ICD-10-CM | POA: Diagnosis not present

## 2023-04-20 DIAGNOSIS — Z130 Encounter for screening for diseases of the blood and blood-forming organs and certain disorders involving the immune mechanism: Secondary | ICD-10-CM | POA: Diagnosis not present

## 2023-04-23 DIAGNOSIS — Z01419 Encounter for gynecological examination (general) (routine) without abnormal findings: Secondary | ICD-10-CM | POA: Diagnosis not present

## 2023-04-23 DIAGNOSIS — R7303 Prediabetes: Secondary | ICD-10-CM | POA: Diagnosis not present

## 2023-05-07 ENCOUNTER — Telehealth: Payer: Self-pay | Admitting: Internal Medicine

## 2023-05-07 NOTE — Telephone Encounter (Signed)
 Patient came by and dropped off leave of absence form similar to the one she had done last year. Once completed it needs to be faxed to [phone]. Patient request a mychart message be sent to her as well to let her know when it has been faxed. She said it has to be submitted by this Friday 05/11/23. I placed in Dr [facility] folder up front.

## 2023-05-08 NOTE — Telephone Encounter (Signed)
Paperwork has been placed in provider basket

## 2023-05-10 NOTE — Telephone Encounter (Signed)
Forms faxed and copy placed up front

## 2023-05-25 ENCOUNTER — Ambulatory Visit: Payer: BLUE CROSS/BLUE SHIELD | Admitting: Internal Medicine

## 2023-06-22 DIAGNOSIS — R590 Localized enlarged lymph nodes: Secondary | ICD-10-CM | POA: Diagnosis not present

## 2023-06-22 DIAGNOSIS — B369 Superficial mycosis, unspecified: Secondary | ICD-10-CM | POA: Diagnosis not present

## 2023-08-27 ENCOUNTER — Ambulatory Visit (HOSPITAL_BASED_OUTPATIENT_CLINIC_OR_DEPARTMENT_OTHER): Admitting: Family Medicine

## 2023-08-27 VITALS — BP 119/70 | HR 80 | Ht 63.0 in | Wt 157.6 lb

## 2023-08-27 DIAGNOSIS — R1031 Right lower quadrant pain: Secondary | ICD-10-CM | POA: Diagnosis not present

## 2023-08-27 DIAGNOSIS — Z1211 Encounter for screening for malignant neoplasm of colon: Secondary | ICD-10-CM

## 2023-08-27 NOTE — Progress Notes (Signed)
 Acute Care Office Visit  Subjective:   Selena Scott May 11, 1980 08/27/2023  Chief Complaint  Patient presents with   Abdominal Pain    Pt states for the past two weeks, she has been having pain above her umbilical area. Will feel it with certain movements and even if she gets up out of bed.    HPI: ABDOMINAL PAIN: Onset: 2 weeks ago. Patient states she felt a sharp epigastric pain upon waking up and getting out of bed. She states the pain worsened with movement, coughing and sneezing.  Location:  Right epigastric pain Description of pain: Sharp, palpable and reproducible Radiation: None Severity: Mild Alleviating factors: Tylenol   Aggravating factors: Movement  No LMP recorded. Patient is perimenopausal. Patient reports last period was in February 2025. She does see OBGYN who is managing perimenopausal period.    Fever: no Nausea: no Vomiting: no Weight loss: no Decreased appetite: no Diarrhea: no Constipation: no Blood in stool: no Heartburn: no Dysuria/urinary frequency: no Hematuria: no History of sexually transmitted disease: no Recurrent NSAID use: no    The following portions of the patient's history were reviewed and updated as appropriate: past medical history, past surgical history, family history, social history, allergies, medications, and problem list.   Patient Active Problem List   Diagnosis Date Noted   Other hypoparathyroidism (HCC) 06/20/2021   Missed period 06/16/2021   History of thyroidectomy 01/25/2018   Postsurgical hypothyroidism 06/15/2017   Iatrogenic hypocalcemia 04/26/2017   Genital herpes simplex 06/04/2015   Past Medical History:  Diagnosis Date   Anemia    Fibroid    GERD (gastroesophageal reflux disease)    hx of    HPV (human papilloma virus) anogenital infection    Medical history non-contributory    Thyroid  disease    Past Surgical History:  Procedure Laterality Date   NO PAST SURGERIES     THYROIDECTOMY N/A  04/19/2017   Procedure: TOTAL THYROIDECTOMY;  Surgeon: Oralee Billow, MD;  Location: [facility];  Service: General;  Laterality: N/A;   Family History  Problem Relation Age of Onset   Diabetes type II Mother    Breast cancer Mother    Hypertension Mother    Thyroid  disease Neg Hx    Outpatient Medications Prior to Visit  Medication Sig Dispense Refill   calcitRIOL  (ROCALTROL ) 0.5 MCG capsule Take 2 capsules (1 mcg total) by mouth daily. 180 capsule 3   Calcium  Carbonate-Vit D-Min (CALTRATE 600+D PLUS MINERALS) 600-800 MG-UNIT TABS Take 2 tablets by mouth in the morning and at bedtime. 120 tablet 11   cholecalciferol (VITAMIN D3) 25 MCG (1000 UNIT) tablet Take 2 tablets (2,000 Units total) by mouth daily. 180 tablet 2   fluocinonide  cream (LIDEX ) 0.05 % Apply 1 Application topically 2 (two) times daily. 60 g 0   levothyroxine  (SYNTHROID ) 112 MCG tablet Take 1 tablet (112 mcg total) by mouth daily. 90 tablet 3   metFORMIN (GLUCOPHAGE-XR) 500 MG 24 hr tablet Take 500 mg by mouth daily with breakfast.     No facility-administered medications prior to visit.   No Known Allergies   ROS: A complete ROS was performed with pertinent positives/negatives noted in the HPI. The remainder of the ROS are negative.    Objective:   Today's Vitals   08/27/23 0907  BP: 119/70  Pulse: 80  SpO2: 100%  Weight: 157 lb 9.6 oz (71.5 kg)  Height: 5\' 3"  (1.6 m)    GENERAL: Well-appearing, in NAD. Well nourished.  SKIN: Pink, warm and dry. No rash, lesion, ulceration, or ecchymoses.  Head: Normocephalic. NECK: Trachea midline. Full ROM w/o pain or tenderness.  RESPIRATORY: Chest wall symmetrical. Respirations even and non-labored. Breath sounds clear to auscultation bilaterally.  CARDIAC: S1, S2 present, regular rate and rhythm without murmur or gallops. Peripheral pulses 2+ bilaterally.  GI: Abdomen soft. Mild tenderness to RLQ with palpation. No acute abdomen, no distention.  Normoactive bowel sounds. No  rebound tenderness. No hepatomegaly or splenomegaly. No CVA tenderness. Negative obturator, psoas signs.  MSK: Muscle tone and strength appropriate for age. NEUROLOGIC: No motor or sensory deficits. Steady, even gait. C2-C12 intact.  PSYCH/MENTAL STATUS: Alert, oriented x 3. Cooperative, appropriate mood and affect.    No results found for any visits on 08/27/23.    Assessment & Plan:  1. RLQ abdominal pain (Primary) Will obtain US  abd and transvaginal/pelvic given location and symptoms. Will obtain labs and UA as part of workup today. Possible appendix inflammation, ovarian cyst versus fibroid. Discussed red flag signs and symptoms to seek ER for and patient verbalized understanding.   - CBC with Differential/Platelet - Comprehensive metabolic panel with GFR - Lipase - Urinalysis, Routine w reflex microscopic - US  Abdomen Complete; Future - US  Pelvic Complete With Transvaginal; Future  2. Screening for colon cancer Due for screening given age. Referral placed.  - Ambulatory referral to Gastroenterology   No orders of the defined types were placed in this encounter.  Lab Orders         CBC with Differential/Platelet         Comprehensive metabolic panel with GFR         Lipase         Urinalysis, Routine w reflex microscopic      Return in about 3 months (around 11/27/2023) for ANNUAL PHYSICAL.    Patient to reach out to office if new, worrisome, or unresolved symptoms arise or if no improvement in patient's condition. Patient verbalized understanding and is agreeable to treatment plan. All questions answered to patient's satisfaction.    Nonda Bays, [place]

## 2023-08-28 ENCOUNTER — Ambulatory Visit (HOSPITAL_BASED_OUTPATIENT_CLINIC_OR_DEPARTMENT_OTHER): Payer: Self-pay | Admitting: Family Medicine

## 2023-08-28 LAB — URINALYSIS, ROUTINE W REFLEX MICROSCOPIC
Bilirubin, UA: NEGATIVE
Glucose, UA: NEGATIVE
Ketones, UA: NEGATIVE
Nitrite, UA: NEGATIVE
Protein,UA: NEGATIVE
RBC, UA: NEGATIVE
Specific Gravity, UA: 1.013 (ref 1.005–1.030)
Urobilinogen, Ur: 0.2 mg/dL (ref 0.2–1.0)
pH, UA: 7 (ref 5.0–7.5)

## 2023-08-28 LAB — CBC WITH DIFFERENTIAL/PLATELET
Basophils Absolute: 0.1 10*3/uL (ref 0.0–0.2)
Basos: 1 %
EOS (ABSOLUTE): 0.1 10*3/uL (ref 0.0–0.4)
Eos: 2 %
Hematocrit: 38.1 % (ref 34.0–46.6)
Hemoglobin: 12 g/dL (ref 11.1–15.9)
Immature Grans (Abs): 0 10*3/uL (ref 0.0–0.1)
Immature Granulocytes: 0 %
Lymphocytes Absolute: 2 10*3/uL (ref 0.7–3.1)
Lymphs: 38 %
MCH: 25.6 pg — ABNORMAL LOW (ref 26.6–33.0)
MCHC: 31.5 g/dL (ref 31.5–35.7)
MCV: 81 fL (ref 79–97)
Monocytes Absolute: 0.5 10*3/uL (ref 0.1–0.9)
Monocytes: 9 %
Neutrophils Absolute: 2.6 10*3/uL (ref 1.4–7.0)
Neutrophils: 50 %
Platelets: 305 10*3/uL (ref 150–450)
RBC: 4.68 x10E6/uL (ref 3.77–5.28)
RDW: 15.2 % (ref 11.7–15.4)
WBC: 5.2 10*3/uL (ref 3.4–10.8)

## 2023-08-28 LAB — COMPREHENSIVE METABOLIC PANEL WITH GFR
ALT: 29 IU/L (ref 0–32)
AST: 23 IU/L (ref 0–40)
Albumin: 4.3 g/dL (ref 3.9–4.9)
Alkaline Phosphatase: 84 IU/L (ref 44–121)
BUN/Creatinine Ratio: 14 (ref 9–23)
BUN: 13 mg/dL (ref 6–24)
Bilirubin Total: 0.5 mg/dL (ref 0.0–1.2)
CO2: 23 mmol/L (ref 20–29)
Calcium: 9 mg/dL (ref 8.7–10.2)
Chloride: 103 mmol/L (ref 96–106)
Creatinine, Ser: 0.9 mg/dL (ref 0.57–1.00)
Globulin, Total: 2.5 g/dL (ref 1.5–4.5)
Glucose: 81 mg/dL (ref 70–99)
Potassium: 4.1 mmol/L (ref 3.5–5.2)
Sodium: 142 mmol/L (ref 134–144)
Total Protein: 6.8 g/dL (ref 6.0–8.5)
eGFR: 80 mL/min/{1.73_m2} (ref >59)

## 2023-08-28 LAB — MICROSCOPIC EXAMINATION
Casts: NONE SEEN /LPF
Epithelial Cells (non renal): >10 /HPF — AB (ref 0–10)

## 2023-08-28 LAB — LIPASE: Lipase: 48 U/L (ref 14–72)

## 2023-08-28 NOTE — Progress Notes (Signed)
 Lipase, CMP unremarkable.  Urine unremarkable for signs of acute infection or hematuria.  CBC unremarkable for signs of infection or inflammation.  Anemia corrected.

## 2023-08-31 ENCOUNTER — Ambulatory Visit (HOSPITAL_BASED_OUTPATIENT_CLINIC_OR_DEPARTMENT_OTHER): Admitting: Family Medicine

## 2023-09-02 ENCOUNTER — Ambulatory Visit (HOSPITAL_BASED_OUTPATIENT_CLINIC_OR_DEPARTMENT_OTHER)
Admission: RE | Admit: 2023-09-02 | Discharge: 2023-09-02 | Disposition: A | Discharge status: Home / Self Care | Source: Ambulatory Visit | Attending: Family Medicine | Admitting: Family Medicine

## 2023-09-02 DIAGNOSIS — R1031 Right lower quadrant pain: Secondary | ICD-10-CM | POA: Diagnosis not present

## 2023-09-03 NOTE — Telephone Encounter (Signed)
 Copied from CRM [id number]. Topic: Clinical - Request for Lab/Test Order >> Aug 31, 2023  4:08 PM Everette C wrote: Reason for CRM: Miki Alert with [facility] has called to request clarity for recently received imaging orders for the patient  Please call at [phone] The urgency has been stressed with this imaging being scheduled for over the weekend

## 2023-09-04 NOTE — Progress Notes (Signed)
 Hi Halynn,  There are no acute concerns to account for your recurring pain on your exam. There was some confusion regarding the ultrasound orders as I did place the pelvic ultrasound and an abdominal ultrasound which was cancelled. At this time, if you are still having recurring pain, I would recommend further imaging and follow up. Please let me know how you are feeling and if you have decided to allow [facility] to be your PCP.

## 2023-09-14 ENCOUNTER — Encounter (HOSPITAL_BASED_OUTPATIENT_CLINIC_OR_DEPARTMENT_OTHER): Payer: Self-pay | Admitting: Family Medicine

## 2023-09-17 ENCOUNTER — Encounter: Payer: Self-pay | Admitting: Pediatrics

## 2023-09-17 ENCOUNTER — Encounter: Payer: Self-pay | Admitting: Physician Assistant

## 2023-09-17 ENCOUNTER — Ambulatory Visit: Payer: Self-pay | Admitting: Physician Assistant

## 2023-09-17 ENCOUNTER — Other Ambulatory Visit (INDEPENDENT_AMBULATORY_CARE_PROVIDER_SITE_OTHER)

## 2023-09-17 ENCOUNTER — Ambulatory Visit: Admitting: Physician Assistant

## 2023-09-17 VITALS — BP 100/60 | HR 88 | Ht 63.5 in | Wt 159.0 lb

## 2023-09-17 DIAGNOSIS — G8929 Other chronic pain: Secondary | ICD-10-CM

## 2023-09-17 DIAGNOSIS — R1031 Right lower quadrant pain: Secondary | ICD-10-CM | POA: Diagnosis not present

## 2023-09-17 DIAGNOSIS — M2550 Pain in unspecified joint: Secondary | ICD-10-CM | POA: Diagnosis not present

## 2023-09-17 DIAGNOSIS — K6289 Other specified diseases of anus and rectum: Secondary | ICD-10-CM | POA: Diagnosis not present

## 2023-09-17 DIAGNOSIS — D649 Anemia, unspecified: Secondary | ICD-10-CM

## 2023-09-17 LAB — IBC + FERRITIN
Ferritin: 5.1 ng/mL — ABNORMAL LOW (ref 10.0–291.0)
Iron: 69 ug/dL (ref 42–145)
Saturation Ratios: 13.3 % — ABNORMAL LOW (ref 20.0–50.0)
TIBC: 519.4 ug/dL — ABNORMAL HIGH (ref 250.0–450.0)
Transferrin: 371 mg/dL — ABNORMAL HIGH (ref 212.0–360.0)

## 2023-09-17 LAB — CBC WITH DIFFERENTIAL/PLATELET
Basophils Absolute: 0 10*3/uL (ref 0.0–0.1)
Basophils Relative: 0.6 % (ref 0.0–3.0)
Eosinophils Absolute: 0.1 10*3/uL (ref 0.0–0.7)
Eosinophils Relative: 1.9 % (ref 0.0–5.0)
HCT: 35.6 % — ABNORMAL LOW (ref 36.0–46.0)
Hemoglobin: 11.7 g/dL — ABNORMAL LOW (ref 12.0–15.0)
Lymphocytes Relative: 46.1 % — ABNORMAL HIGH (ref 12.0–46.0)
Lymphs Abs: 2.4 10*3/uL (ref 0.7–4.0)
MCHC: 32.8 g/dL (ref 30.0–36.0)
MCV: 77 fl — ABNORMAL LOW (ref 78.0–100.0)
Monocytes Absolute: 0.4 10*3/uL (ref 0.1–1.0)
Monocytes Relative: 8.3 % (ref 3.0–12.0)
Neutro Abs: 2.2 10*3/uL (ref 1.4–7.7)
Neutrophils Relative %: 43.1 % (ref 43.0–77.0)
Platelets: 259 10*3/uL (ref 150.0–400.0)
RBC: 4.63 Mil/uL (ref 3.87–5.11)
RDW: 15.8 % — ABNORMAL HIGH (ref 11.5–15.5)
WBC: 5.1 10*3/uL (ref 4.0–10.5)

## 2023-09-17 LAB — COMPREHENSIVE METABOLIC PANEL WITH GFR
ALT: 19 U/L (ref 0–35)
AST: 20 U/L (ref 0–37)
Albumin: 4.1 g/dL (ref 3.5–5.2)
Alkaline Phosphatase: 63 U/L (ref 39–117)
BUN: 15 mg/dL (ref 6–23)
CO2: 29 meq/L (ref 19–32)
Calcium: 9.1 mg/dL (ref 8.4–10.5)
Chloride: 103 meq/L (ref 96–112)
Creatinine, Ser: 1.04 mg/dL (ref 0.40–1.20)
GFR: 64.97 mL/min (ref >60.00)
Glucose, Bld: 88 mg/dL (ref 70–99)
Potassium: 4.1 meq/L (ref 3.5–5.1)
Sodium: 142 meq/L (ref 135–145)
Total Bilirubin: 0.6 mg/dL (ref 0.2–1.2)
Total Protein: 7.4 g/dL (ref 6.0–8.3)

## 2023-09-17 LAB — VITAMIN B12: Vitamin B-12: 410 pg/mL (ref 211–911)

## 2023-09-17 LAB — SEDIMENTATION RATE: Sed Rate: 29 mm/h — ABNORMAL HIGH (ref 0–20)

## 2023-09-17 LAB — C-REACTIVE PROTEIN: CRP: <1 mg/dL (ref 0.5–20.0)

## 2023-09-17 MED ORDER — NA SULFATE-K SULFATE-MG SULF 17.5-3.13-1.6 GM/177ML PO SOLN: 1.0000 | Freq: Once | ORAL | 0 refills | Status: AC

## 2023-09-17 MED ORDER — HYDROCORTISONE ACETATE 25 MG RE SUPP: 25.0000 mg | Freq: Two times a day (BID) | RECTAL | 0 refills | Status: DC

## 2023-09-17 NOTE — Progress Notes (Signed)
 09/17/2023 Selena Scott [id number] 1981/03/01  Referring provider: Oris Camie BRAVO, NP Primary GI doctor: Dr. Suzann  ASSESSMENT AND PLAN:  Right lower quadrant abdominal pain Worse with bending over, laughing, no associated symptoms Sounds more MSK, + carrnett on exam 05/21/2021 RUQ US  unremarkable 09/02/2023 transvaginal ultrasound unremarkable Having polyarthalgia.no rashes  Schedule colonoscopy to rule out gastrointestinal pathology.We have discussed the risks of bleeding, infection, perforation, medication reactions, and remote risk of death associated with colonoscopy. All questions were answered and the patient acknowledges these risk and wishes to proceed. - Order inflammatory markers (sed rate, CRP). - Recommend Salonpas patches for muscular pain relief.  Rectal pain/tearing pain at times Has had rectal pain last 6 months States she has normal Bm's 4-5 days, not worse with stools, can be painful with sitting, no hematochezia, hydrocortisone helps Saw GYN and given nystatin and hydrocortisone Possible hemorrhoids,no fissure, negative FOBT on exam -Sitz baths, increase fiber, increase water -Hydrocortisone supp given and external cream sent in.  -We discussed hemorrhoid banding here in the office for internal hemorrhoids if not improving with conservative treatment. - follow up for evaluation here in the office after colonoscopy.   Normocytic anemia History from 2019 with baseline hemoglobin 11 recent normal HGB 08/27/2023  HGB 12.0 MCV 81 Platelets 305 Recent Labs    08/27/23 0945  HGB 12.0  Has not had menses since April and a year before that Check Iron, ferritin, B12 Possible thalassemia if normal   History of thyroidectomy Postsurgical hypothyroidism   Patient Care Team: Early, Camie BRAVO, NP as PCP - General (Nurse Practitioner)  HISTORY OF PRESENT ILLNESS: 43 y.o. female with a past medical history listed below presents as a new patient for evaluation of  right lower quadrant abdominal pain and discuss colonoscopy.   Discussed the use of AI scribe software for clinical note transcription with the patient, who gave verbal consent to proceed.  History of Present Illness   Selena Scott is a 43 year old female who presents with right-sided abdominal pain and rectal discomfort.  She experiences sharp, striking pain on the right side of her abdomen, which worsens with movement, bending over, or laughing. This pain has persisted for four days. She underwent a vaginal ultrasound but did not complete all recommended tests. No fever, chills, or changes in bowel habits.  She reports joint pain that began after her abdominal pain, affecting her wrists, back, neck, and ankles. The pain is widespread and was not present prior to her abdominal issues. No associated fever, chills, weight loss, or rashes.  For the past six months, she has experienced irritation and inflammation in her anal area, with occasional tearing between her buttocks. This discomfort is not associated with bowel movements and occurs intermittently for four to five days before subsiding. She uses ibuprofen  and a prescribed serum for relief. No rectal bleeding or changes in bowel habits.  Her past medical history includes thyroid  surgery for nodules and a history of anemia diagnosed in childhood. She reports craving ice, which she associates with her anemia. Her menstrual history includes irregular periods, with the last one occurring in April, and prior to that, almost a year without menstruation.  She denies any family history of colon cancer or autoimmune diseases such as Crohn's disease, ulcerative colitis, or psoriasis.      She  reports that she has never smoked. She has never used smokeless tobacco. She reports current alcohol use. She reports that she does not use drugs. Has  63 year old son  RELEVANT GI HISTORY, IMAGING AND LABS: Results   LABS CBC: Within normal limits       CBC    Component Value Date/Time   WBC 5.2 08/27/2023 0945   WBC 5.3 05/16/2021 1225   RBC 4.68 08/27/2023 0945   RBC 4.56 05/16/2021 1225   HGB 12.0 08/27/2023 0945   HCT 38.1 08/27/2023 0945   PLT 305 08/27/2023 0945   MCV 81 08/27/2023 0945   MCH 25.6 (L) 08/27/2023 0945   MCH 25.7 (L) 05/16/2021 1225   MCHC 31.5 08/27/2023 0945   MCHC 33.0 05/16/2021 1225   RDW 15.2 08/27/2023 0945   LYMPHSABS 2.0 08/27/2023 0945   MONOABS 0.5 04/17/2018 1835   EOSABS 0.1 08/27/2023 0945   BASOSABS 0.1 08/27/2023 0945   Recent Labs    08/27/23 0945  HGB 12.0    CMP     Component Value Date/Time   NA 142 08/27/2023 0945   K 4.1 08/27/2023 0945   CL 103 08/27/2023 0945   CO2 23 08/27/2023 0945   GLUCOSE 81 08/27/2023 0945   GLUCOSE 77 11/24/2022 1000   BUN 13 08/27/2023 0945   CREATININE 0.90 08/27/2023 0945   CALCIUM  9.0 08/27/2023 0945   CALCIUM  7.3 (L) 04/28/2017 0956   PROT 6.8 08/27/2023 0945   ALBUMIN 4.3 08/27/2023 0945   AST 23 08/27/2023 0945   ALT 29 08/27/2023 0945   ALKPHOS 84 08/27/2023 0945   BILITOT 0.5 08/27/2023 0945   GFRNONAA >60 05/16/2021 1225   GFRAA >60 04/17/2018 1835      Latest Ref Rng & Units 08/27/2023    9:45 AM 11/24/2022   10:00 AM 07/07/2022    8:55 AM  Hepatic Function  Total Protein 6.0 - 8.5 g/dL 6.8     Albumin 3.9 - 4.9 g/dL 4.3  4.2  4.4   AST 0 - 40 IU/L 23     ALT 0 - 32 IU/L 29     Alk Phosphatase 44 - 121 IU/L 84     Total Bilirubin 0.0 - 1.2 mg/dL 0.5         Current Medications:   Current Outpatient Medications (Endocrine & Metabolic):    calcitRIOL  (ROCALTROL ) 0.5 MCG capsule, Take 2 capsules (1 mcg total) by mouth daily.   levothyroxine  (SYNTHROID ) 112 MCG tablet, Take 1 tablet (112 mcg total) by mouth daily.      Current Outpatient Medications (Other):    Calcium  Carbonate-Vit D-Min (CALTRATE 600+D PLUS MINERALS) 600-800 MG-UNIT TABS, Take 2 tablets by mouth in the morning and at bedtime.   cholecalciferol  (VITAMIN D3) 25 MCG (1000 UNIT) tablet, Take 2 tablets (2,000 Units total) by mouth daily.   hydrocortisone (ANUSOL-HC) 2.5 % rectal cream, Place 1 Application rectally as needed for hemorrhoids or anal itching.   hydrocortisone (ANUSOL-HC) 25 MG suppository, Place 1 suppository (25 mg total) rectally 2 (two) times daily.   nystatin cream (MYCOSTATIN), Apply 1 Application topically as needed for dry skin.  Medical History:  Past Medical History:  Diagnosis Date   Anemia    Fibroid    GERD (gastroesophageal reflux disease)    hx of    HPV (human papilloma virus) anogenital infection    Medical history non-contributory    Thyroid  disease    Allergies: No Known Allergies   Surgical History:  She  has a past surgical history that includes No past surgeries and Thyroidectomy (N/A, 04/19/2017). Family History:  Her family history includes Breast  cancer in her mother; Cataracts in her maternal grandmother; Diabetes in her maternal grandfather and maternal grandmother; Diabetes type II in her mother; Fibroids in her mother; Heart attack in her maternal grandfather; Hypertension in her mother; Other in her brother; Scoliosis in her daughter.  REVIEW OF SYSTEMS  : All other systems reviewed and negative except where noted in the History of Present Illness.  PHYSICAL EXAM: BP 100/60 (BP Location: Left Arm, Patient Position: Sitting, Cuff Size: Normal)   Pulse 88   Ht 5' 3.5 (1.613 m) Comment: height measured without shoes  Wt 159 lb (72.1 kg)   BMI 27.72 kg/m  Physical Exam   GENERAL APPEARANCE: Well nourished, in no apparent distress. HEENT: No cervical lymphadenopathy, unremarkable thyroid , sclerae anicteric, conjunctiva pink. RESPIRATORY: Respiratory effort normal, breath sounds equal bilateral without rales, rhonchi, wheezing. CARDIO: Regular rate and rhythm with no murmurs, rubs, or gallops, peripheral pulses intact. ABDOMEN: Soft, non-distended, active bowel sounds in all four  quadrants, tenderness to palpation on the right side, no rebound, no mass appreciated. RECTAL: External anal skin tags observed, no fissures, no rectal masses palpated, hemoccult test negative. MUSCULOSKELETAL: Full range of motion, normal gait, without edema. SKIN: Dry, intact without rashes or lesions. No jaundice. NEURO: Alert, oriented, no focal deficits. PSYCH: Cooperative, normal mood and affect.      Alan JONELLE Coombs, PA-C 11:51 AM

## 2023-09-17 NOTE — Patient Instructions (Addendum)
 Your provider has requested that you go to the basement level for lab work before leaving today. Press B on the elevator. The lab is located at the first door on the left as you exit the elevator.  Try a salon pas patches are over the counter and voltern gel is topical antiinflammatory that is safe.   Please do sitz baths- these can be found at the pharmacy. It is a Chief Operating Officer that is put in your toliet.  Please increase fiber or add benefiber, increase water and increase acitivity.  Will send in hydrocoritsone suppository, cheapest with GOODRX from [employer], [employer], [employer] or [facility] if your insurance does not pay for it. If the hemorrhoid suppository sent in is too expensive you can do this over the counter trick.  Apply a pea size amount of generic prescription Anusol HC cream that has been sent into your pharmacy to the tip of an over the counter PrepH suppository and insert rectally once every night for at least 7 nights.  If this does not improve there are procedures that can be done.   About Hemorrhoids  Hemorrhoids are swollen veins in the lower rectum and anus.  Also called piles, hemorrhoids are a common problem.  Hemorrhoids may be internal (inside the rectum) or external (around the anus).  Internal Hemorrhoids  Internal hemorrhoids are often painless, but they rarely cause bleeding.  The internal veins may stretch and fall down (prolapse) through the anus to the outside of the body.  The veins may then become irritated and painful.  External Hemorrhoids  External hemorrhoids can be easily seen or felt around the anal opening.  They are under the skin around the anus.  When the swollen veins are scratched or broken by straining, rubbing or wiping they sometimes bleed.  How Hemorrhoids Occur  Veins in the rectum and around the anus tend to swell under pressure.  Hemorrhoids can result from increased pressure in the veins of your anus or rectum.  Some sources of  pressure are:  Straining to have a bowel movement because of constipation Waiting too long to have a bowel movement Coughing and sneezing often Sitting for extended periods of time, including on the toilet Diarrhea Obesity Trauma or injury to the anus Some liver diseases Stress Family history of hemorrhoids Pregnancy  Pregnant women should try to avoid becoming constipated, because they are more likely to have hemorrhoids during pregnancy.  In the last trimester of pregnancy, the enlarged uterus may press on blood vessels and causes hemorrhoids.  In addition, the strain of childbirth sometimes causes hemorrhoids after the birth.  Symptoms of Hemorrhoids  Some symptoms of hemorrhoids include: Swelling and/or a tender lump around the anus Itching, mild burning and bleeding around the anus Painful bowel movements with or without constipation Bright red blood covering the stool, on toilet paper or in the toilet bowel.   Symptoms usually go away within a few days.  Always talk to your doctor about any bleeding to make sure it is not from some other causes.  Here some information about pelvic floor dysfunction. This may be contributing to some of your symptoms. We will continue with our evaluation but I do want you to consider adding on fiber supplement with low-dose MiraLAX daily. We could also refer to pelvic floor physical therapy.   Pelvic Floor Dysfunction, Female Pelvic floor dysfunction (PFD) is a condition that results when the group of muscles and connective tissues that support the organs in the pelvis (  pelvic floor muscles) do not work well. These muscles and their connections form a sling that supports the colon and bladder. In women, they also support the uterus. PFD causes pelvic floor muscles to be too weak, too tight, or both. In PFD, muscle movements are not coordinated. This may cause bowel or bladder problems. It may also cause pain. What are the causes? This condition  may be caused by an injury to the pelvic area or by a weakening of pelvic muscles. This often results from pregnancy and childbirth or other types of strain. In many cases, the exact cause is not known. What increases the risk? The following factors may make you more likely to develop this condition: Having chronic bladder tissue inflammation (interstitial cystitis). Being an older person. Being overweight. History of radiation treatment for cancer in the pelvic region. Previous pelvic surgery, such as removal of the uterus (hysterectomy). What are the signs or symptoms? Symptoms of this condition vary and may include: Bladder symptoms, such as: Trouble starting urination and emptying the bladder. Frequent urinary tract infections. Leaking urine when coughing, laughing, or exercising (stress incontinence). Having to pass urine urgently or frequently. Pain when passing urine. Bowel symptoms, such as: Constipation. Urgent or frequent bowel movements. Incomplete bowel movements. Painful bowel movements. Leaking stool or gas. Unexplained genital or rectal pain. Genital or rectal muscle spasms. Low back pain. Other symptoms may include: A heavy, full, or aching feeling in the vagina. A bulge that protrudes into the vagina. Pain during or after sex. How is this diagnosed? This condition may be diagnosed based on: Your symptoms and medical history. A physical exam. During the exam, your health care provider may check your pelvic muscles for tightness, spasm, pain, or weakness. This may include a rectal exam and a pelvic exam. In some cases, you may have diagnostic tests, such as: Electrical muscle function tests. Urine flow testing. X-ray tests of bowel function. Ultrasound of the pelvic organs. How is this treated? Treatment for this condition depends on the symptoms. Treatment options include: Physical therapy. This may include Kegel exercises to help relax or strengthen the pelvic  floor muscles. Biofeedback. This type of therapy provides feedback on how tight your pelvic floor muscles are so that you can learn to control them. Internal or external massage therapy. A treatment that involves electrical stimulation of the pelvic floor muscles to help control pain (transcutaneous electrical nerve stimulation, or TENS). Sound wave therapy (ultrasound) to reduce muscle spasms. Medicines, such as: Muscle relaxants. Bladder control medicines. Surgery to reconstruct or support pelvic floor muscles may be an option if other treatments do not help. Follow these instructions at home: Activity Do your usual activities as told by your health care provider. Ask your health care provider if you should modify any activities. Do pelvic floor strengthening or relaxing exercises at home as told by your physical therapist. Lifestyle Maintain a healthy weight. Eat foods that are high in fiber, such as beans, whole grains, and fresh fruits and vegetables. Limit foods that are high in fat and processed sugars, such as fried or sweet foods. Manage stress with relaxation techniques such as yoga or meditation. General instructions If you have problems with leakage: Use absorbable pads or wear padded underwear. Wash frequently with mild soap. Keep your genital and anal area as clean and dry as possible. Ask your health care provider if you should try a barrier cream to prevent skin irritation. Take warm baths to relieve pelvic muscle tension or spasms. Take  over-the-counter and prescription medicines only as told by your health care provider. Keep all follow-up visits. How is this prevented? The cause of PFD is not always known, but there are a few things you can do to reduce the risk of developing this condition, including: Staying at a healthy weight. Getting regular exercise. Managing stress. Contact a health care provider if: Your symptoms are not improving with home care. You have  signs or symptoms of PFD that get worse at home. You develop new signs or symptoms. You have signs of a urinary tract infection, such as: Fever. Chills. Increased urinary frequency. A burning feeling when urinating. You have not had a bowel movement in 3 days (constipation). Summary Pelvic floor dysfunction results when the muscles and connective tissues in your pelvic floor do not work well. These muscles and their connections form a sling that supports your colon and bladder. In women, they also support the uterus. PFD may be caused by an injury to the pelvic area or by a weakening of pelvic muscles. PFD causes pelvic floor muscles to be too weak, too tight, or a combination of both. Symptoms may vary from person to person. In most cases, PFD can be treated with physical therapies and medicines. Surgery may be an option if other treatments do not help. This information is not intended to replace advice given to you by your health care provider. Make sure you discuss any questions you have with your health care provider. Document Revised: 07/21/2020 Document Reviewed: 07/21/2020 Elsevier Patient Education  2022 Elsevier Inc.  You have been scheduled for a colonoscopy. Please follow written instructions given to you at your visit today.   If you use inhalers (even only as needed), please bring them with you on the day of your procedure.  DO NOT TAKE 7 DAYS PRIOR TO TEST- Trulicity (dulaglutide) Ozempic, Wegovy (semaglutide) Mounjaro (tirzepatide) Bydureon Bcise (exanatide extended release)  DO NOT TAKE 1 DAY PRIOR TO YOUR TEST Rybelsus (semaglutide) Adlyxin (lixisenatide) Victoza (liraglutide) Byetta (exanatide) ___________________________________________________________________________    Due to recent changes in healthcare laws, you may see the results of your imaging and laboratory studies on MyChart before your provider has had a chance to review them.  We understand that in  some cases there may be results that are confusing or concerning to you. Not all laboratory results come back in the same time frame and the provider may be waiting for multiple results in order to interpret others.  Please give us  48 hours in order for your provider to thoroughly review all the results before contacting the office for clarification of your results.    I appreciate the  opportunity to care for you  Thank You   [facility]

## 2023-09-18 NOTE — Progress Notes (Signed)
 [facility] History and Physical   Primary Care Physician:  Oris Camie BRAVO, NP   Reason for Procedure:  Right lower quadrant abdominal pain, rectal pain, normocytic anemia  Plan:    Upper endoscopy and colonoscopy     HPI: Selena Scott is a 43 y.o. female undergoing upper endoscopy and colonoscopy for evaluation and management of normocytic anemia, right lower quadrant abdominal pain and rectal pain.  Laboratory studies have shown mild anemia with hemoglobin ranging 11-12.  Labs with normal iron but low ferritin and elevated TIBC.  Reports right lower quadrant abdominal pain that is exacerbated by movement and bending.  Has had rectal pain x 6 months.  Having normal bowel movements but notes that rectal pain occur and occur with sitting.  Negative FOBT in clinic.  No prior colonoscopy.  No family history of colorectal cancer or polyps.   Past Medical History:  Diagnosis Date   Anemia    Fibroid    GERD (gastroesophageal reflux disease)    hx of    HPV (human papilloma virus) anogenital infection    Medical history non-contributory    Thyroid  disease     Past Surgical History:  Procedure Laterality Date   NO PAST SURGERIES     THYROIDECTOMY N/A 04/19/2017   Procedure: TOTAL THYROIDECTOMY;  Surgeon: Eletha Boas, MD;  Location: [facility];  Service: General;  Laterality: N/A;    Prior to Admission medications   Medication Sig Start Date End Date Taking? Authorizing Provider  calcitRIOL  (ROCALTROL ) 0.5 MCG capsule Take 2 capsules (1 mcg total) by mouth daily. 11/24/22   Shamleffer, Ibtehal Jaralla, MD  Calcium  Carbonate-Vit D-Min (CALTRATE 600+D PLUS MINERALS) 600-800 MG-UNIT TABS Take 2 tablets by mouth in the morning and at bedtime. 05/12/22   Shamleffer, Ibtehal Jaralla, MD  cholecalciferol (VITAMIN D3) 25 MCG (1000 UNIT) tablet Take 2 tablets (2,000 Units total) by mouth daily. 06/21/21   Shamleffer, Ibtehal Jaralla, MD  hydrocortisone  (ANUSOL -HC) 2.5 % rectal cream Place  1 Application rectally as needed for hemorrhoids or anal itching.    [provider]  hydrocortisone  (ANUSOL -HC) 25 MG suppository Place 1 suppository (25 mg total) rectally 2 (two) times daily. 09/17/23   Craig Alan SAUNDERS, PA-C  levothyroxine  (SYNTHROID ) 112 MCG tablet Take 1 tablet (112 mcg total) by mouth daily. 11/24/22   Shamleffer, Ibtehal Jaralla, MD  nystatin cream (MYCOSTATIN) Apply 1 Application topically as needed for dry skin.    [provider]    Current Outpatient Medications  Medication Sig Dispense Refill   calcitRIOL  (ROCALTROL ) 0.5 MCG capsule Take 2 capsules (1 mcg total) by mouth daily. 180 capsule 3   Calcium  Carbonate-Vit D-Min (CALTRATE 600+D PLUS MINERALS) 600-800 MG-UNIT TABS Take 2 tablets by mouth in the morning and at bedtime. 120 tablet 11   cholecalciferol (VITAMIN D3) 25 MCG (1000 UNIT) tablet Take 2 tablets (2,000 Units total) by mouth daily. 180 tablet 2   levothyroxine  (SYNTHROID ) 112 MCG tablet Take 1 tablet (112 mcg total) by mouth daily. 90 tablet 3   hydrocortisone  (ANUSOL -HC) 2.5 % rectal cream Place 1 Application rectally as needed for hemorrhoids or anal itching.     hydrocortisone  (ANUSOL -HC) 25 MG suppository Place 1 suppository (25 mg total) rectally 2 (two) times daily. 12 suppository 0   nystatin cream (MYCOSTATIN) Apply 1 Application topically as needed for dry skin.     No current facility-administered medications for this visit.    Allergies as of 09/21/2023   (No Known Allergies)  Family History  Problem Relation Age of Onset   Diabetes type II Mother    Breast cancer Mother    Hypertension Mother    Fibroids Mother    Other Brother        48 brothers   Cataracts Maternal Grandmother    Diabetes Maternal Grandmother    Heart attack Maternal Grandfather    Diabetes Maternal Grandfather    Scoliosis Daughter    Thyroid  disease Neg Hx    Colon cancer Neg Hx    Esophageal cancer Neg Hx    Rectal cancer Neg Hx     Stomach cancer Neg Hx     Social History   Socioeconomic History   Marital status: Married    Spouse name: Not on file   Number of children: 3   Years of education: Not on file   Highest education level: Bachelor's degree (e.g., BA, AB, BS)  Occupational History   Not on file  Tobacco Use   Smoking status: Never   Smokeless tobacco: Never  Vaping Use   Vaping status: Never Used  Substance and Sexual Activity   Alcohol use: Yes    Comment: occasional wine   Drug use: No   Sexual activity: Yes  Other Topics Concern   Not on file  Social History Narrative   Not on file   Social Drivers of Health   Financial Resource Strain: Low Risk  (08/27/2023)   Overall Financial Resource Strain (CARDIA)    Difficulty of Paying Living Expenses: Not hard at all  Food Insecurity: No Food Insecurity (08/27/2023)   Hunger Vital Sign    Worried About Running Out of Food in the Last Year: Never true    Ran Out of Food in the Last Year: Never true  Transportation Needs: No Transportation Needs (08/27/2023)   PRAPARE - Administrator, Civil Service (Medical): No    Lack of Transportation (Non-Medical): No  Physical Activity: Insufficiently Active (08/27/2023)   Exercise Vital Sign    Days of Exercise per Week: 1 day    Minutes of Exercise per Session: 10 min  Stress: Stress Concern Present (08/27/2023)   [employer] of Occupational Health - Occupational Stress Questionnaire    Feeling of Stress : To some extent  Social Connections: Moderately Integrated (08/27/2023)   Social Connection and Isolation Panel    Frequency of Communication with Friends and Family: More than three times a week    Frequency of Social Gatherings with Friends and Family: Once a week    Attends Religious Services: 1 to 4 times per year    Active Member of [employer] or Organizations: No    Attends Engineer, structural: Not on file    Marital Status: Married  Intimate Partner Violence: Unknown (07/01/2021)    Received from [facility]   HITS    Physically Hurt: Not on file    Insult or Talk Down To: Not on file    Threaten Physical Harm: Not on file    Scream or Curse: Not on file    Review of Systems:  All other review of systems negative except as mentioned in the HPI.  Physical Exam: Vital signs BP 116/72   Pulse 79   Temp 98.2 F (36.8 C) (Temporal)   Resp 16   Ht 5' 3 (1.6 m)   Wt 159 lb (72.1 kg)   SpO2 100%   BMI 28.17 kg/m   General:   Alert,  Well-developed, well-nourished,  pleasant and cooperative in NAD Airway:  Mallampati 2 Lungs:  Clear throughout to auscultation.   Heart:  Regular rate and rhythm; no murmurs, clicks, rubs,  or gallops. Abdomen:  Soft, nontender and nondistended. Normal bowel sounds.   Neuro/Psych:  Normal mood and affect. A and O x 3  Inocente Hausen, MD [facility]

## 2023-09-19 LAB — IGA: Immunoglobulin A: 177 mg/dL (ref 47–310)

## 2023-09-19 LAB — TISSUE TRANSGLUTAMINASE, IGA: (tTG) Ab, IgA: <1 U/mL

## 2023-09-21 ENCOUNTER — Encounter: Payer: Self-pay | Admitting: Pediatrics

## 2023-09-21 ENCOUNTER — Telehealth: Payer: Self-pay

## 2023-09-21 ENCOUNTER — Ambulatory Visit: Admitting: Pediatrics

## 2023-09-21 VITALS — BP 100/59 | HR 81 | Temp 98.2°F | Resp 16 | Ht 63.0 in | Wt 159.0 lb

## 2023-09-21 DIAGNOSIS — K317 Polyp of stomach and duodenum: Secondary | ICD-10-CM

## 2023-09-21 DIAGNOSIS — K644 Residual hemorrhoidal skin tags: Secondary | ICD-10-CM

## 2023-09-21 DIAGNOSIS — K449 Diaphragmatic hernia without obstruction or gangrene: Secondary | ICD-10-CM | POA: Diagnosis not present

## 2023-09-21 DIAGNOSIS — K6289 Other specified diseases of anus and rectum: Secondary | ICD-10-CM

## 2023-09-21 DIAGNOSIS — B9681 Helicobacter pylori [H. pylori] as the cause of diseases classified elsewhere: Secondary | ICD-10-CM

## 2023-09-21 DIAGNOSIS — G8929 Other chronic pain: Secondary | ICD-10-CM

## 2023-09-21 DIAGNOSIS — K648 Other hemorrhoids: Secondary | ICD-10-CM | POA: Diagnosis not present

## 2023-09-21 DIAGNOSIS — D649 Anemia, unspecified: Secondary | ICD-10-CM | POA: Diagnosis not present

## 2023-09-21 DIAGNOSIS — K2950 Unspecified chronic gastritis without bleeding: Secondary | ICD-10-CM

## 2023-09-21 MED ORDER — SODIUM CHLORIDE 0.9 % IV SOLN: 500.0000 mL | INTRAVENOUS | Status: DC

## 2023-09-21 NOTE — Op Note (Signed)
 [facility] Patient Name: Selena Scott Procedure Date: 09/21/2023 7:38 AM MRN: [id number] Endoscopist: Inocente Hausen , MD, [id number] Age: 43 Referring MD:  Date of Birth: 1980/08/08 Gender: Female Account #: [id number] Procedure:                Upper GI endoscopy Indications:              Abdominal pain, Normocytic anemia Procedure:                Pre-Anesthesia Assessment:                           - Prior to the procedure, a History and Physical                            was performed, and patient medications and                            allergies were reviewed. The patient's tolerance of                            previous anesthesia was also reviewed. The risks                            and benefits of the procedure and the sedation                            options and risks were discussed with the patient.                            All questions were answered, and informed consent                            was obtained. Prior Anticoagulants: The patient has                            taken no anticoagulant or antiplatelet agents. ASA                            Grade Assessment: II - A patient with mild systemic                            disease. After reviewing the risks and benefits,                            the patient was deemed in satisfactory condition to                            undergo the procedure.                           After obtaining informed consent, the endoscope was                            passed under direct vision. Throughout the  procedure, the patient's blood pressure, pulse, and                            oxygen saturations were monitored continuously. The                            Olympus Scope [id number] was introduced through the                            mouth, and advanced to the second part of duodenum.                            The upper GI endoscopy was accomplished without                             difficulty. The patient tolerated the procedure                            well. Scope In: Scope Out: Findings:                 The examined esophagus was normal.                           A single 5 mm sessile polyp was found in the                            gastric fundus. Biopsies were taken with a cold                            forceps for histology.                           The gastric body, gastric antrum, cardia (on                            retroflexion) and gastric fundus (on retroflexion)                            were normal. Biopsies were taken with a cold                            forceps for Helicobacter pylori testing.                           A small hiatal hernia was present.                           The duodenal bulb and second portion of the                            duodenum were normal. Complications:            No immediate complications. Estimated blood loss:  Minimal. Estimated Blood Loss:     Estimated blood loss was minimal. Impression:               - Normal esophagus.                           - A single gastric polyp. Biopsied.                           - Normal gastric body, antrum, cardia and gastric                            fundus. Biopsied.                           - Small hiatal hernia.                           - Normal duodenal bulb and second portion of the                            duodenum. Recommendation:           - Await pathology results.                           - Perform a colonoscopy today.                           - The findings and recommendations were discussed                            with the patient's family. Inocente Hausen, MD 09/21/2023 8:27:33 AM This report has been signed electronically.

## 2023-09-21 NOTE — Op Note (Signed)
 [facility] Patient Name: Selena Scott Procedure Date: 09/21/2023 7:37 AM MRN: [id number] Endoscopist: Inocente Hausen , MD, [id number] Age: 43 Referring MD:  Date of Birth: 1980-04-08 Gender: Female Account #: [id number] Procedure:                Colonoscopy Indications:              This is the patient's first colonoscopy, Abdominal                            pain in the right lower quadrant, Anemia, Rectal                            pain Medicines:                Monitored Anesthesia Care Procedure:                Pre-Anesthesia Assessment:                           - Prior to the procedure, a History and Physical                            was performed, and patient medications and                            allergies were reviewed. The patient's tolerance of                            previous anesthesia was also reviewed. The risks                            and benefits of the procedure and the sedation                            options and risks were discussed with the patient.                            All questions were answered, and informed consent                            was obtained. Prior Anticoagulants: The patient has                            taken no anticoagulant or antiplatelet agents. ASA                            Grade Assessment: II - A patient with mild systemic                            disease. After reviewing the risks and benefits,                            the patient was deemed in satisfactory condition to  undergo the procedure.                           After obtaining informed consent, the colonoscope                            was passed under direct vision. Throughout the                            procedure, the patient's blood pressure, pulse, and                            oxygen saturations were monitored continuously. The                            Olympus Scope SN: [id number] was introduced through                             the anus and advanced to the terminal ileum. The                            colonoscopy was performed without difficulty. The                            patient tolerated the procedure well. The quality                            of the bowel preparation was good. The terminal                            ileum, ileocecal valve, appendiceal orifice, and                            rectum were photographed. Scope In: 8:08:58 AM Scope Out: 8:26:29 AM Scope Withdrawal Time: 0 hours 9 minutes 50 seconds  Total Procedure Duration: 0 hours 17 minutes 31 seconds  Findings:                 Hemorrhoids were found on perianal exam.                           The digital rectal exam was normal. Pertinent                            negatives include normal sphincter tone and no                            palpable rectal lesions.                           The colon (entire examined portion) appeared normal.                           The terminal ileum appeared normal.  Internal hemorrhoids were found. Complications:            No immediate complications. Estimated blood loss:                            None. Estimated Blood Loss:     Estimated blood loss: none. Impression:               - Hemorrhoids found on perianal exam. This may                            explain the patient's symptoms of rectal pain.                           - The entire examined colon is normal.                           - The examined portion of the ileum was normal.                           - Internal hemorrhoids.                           - No specimens collected. Recommendation:           - Discharge patient to home (ambulatory).                           - If RLQ abdominal pain persists recommend CT                            abdomen/pelvis with contrast.                           - Return to GI clinic in 6-8 weeks with Dr. Suzann                            or APP                            - The findings and recommendations were discussed                            with the patient's family.                           - Patient has a contact number available for                            emergencies. The signs and symptoms of potential                            delayed complications were discussed with the                            patient. Return to normal activities tomorrow.  Written discharge instructions were provided to the                            patient. Inocente Hausen, MD 09/21/2023 8:33:01 AM This report has been signed electronically.

## 2023-09-21 NOTE — Telephone Encounter (Signed)
-----   Message from Inocente CHRISTELLA Hausen sent at 09/21/2023 12:20 PM EDT ----- Regarding: CT scan of abdomen and pelvis with contrast Please schedule Ms. Mccartney for a CT scan of the abdomen and pelvis with contrast for investigation of right lower quadrant abdominal pain  Thanks,  Inocente

## 2023-09-21 NOTE — Telephone Encounter (Signed)
 CT order in epic. Secure staff message sent to radiology scheduling to contact patient to set up appt.   MyChart message sent to patient with information.

## 2023-09-21 NOTE — Patient Instructions (Addendum)
 Please see Follow up appointment scheduled.   YOU HAD AN ENDOSCOPIC PROCEDURE TODAY AT THE [facility]:   Refer to the procedure report that was given to you for any specific questions about what was found during the examination.  If the procedure report does not answer your questions, please call your gastroenterologist to clarify.  If you requested that your care partner not be given the details of your procedure findings, then the procedure report has been included in a sealed envelope for you to review at your convenience later.  YOU SHOULD EXPECT: Some feelings of bloating in the abdomen. Passage of more gas than usual.  Walking can help get rid of the air that was put into your GI tract during the procedure and reduce the bloating. If you had a lower endoscopy (such as a colonoscopy or flexible sigmoidoscopy) you may notice spotting of blood in your stool or on the toilet paper. If you underwent a bowel prep for your procedure, you may not have a normal bowel movement for a few days.  Please Note:  You might notice some irritation and congestion in your nose or some drainage.  This is from the oxygen used during your procedure.  There is no need for concern and it should clear up in a day or so.  SYMPTOMS TO REPORT IMMEDIATELY:  Following lower endoscopy (colonoscopy or flexible sigmoidoscopy):  Excessive amounts of blood in the stool  Significant tenderness or worsening of abdominal pains  Swelling of the abdomen that is new, acute  Fever of 100F or higher  Following upper endoscopy (EGD)  Vomiting of blood or coffee ground material  New chest pain or pain under the shoulder blades  Painful or persistently difficult swallowing  New shortness of breath  Fever of 100F or higher  Black, tarry-looking stools  For urgent or emergent issues, a gastroenterologist can be reached at any hour by calling [phone]. Do not use MyChart messaging for urgent concerns.    DIET:   We do recommend a small meal at first, but then you may proceed to your regular diet.  Drink plenty of fluids but you should avoid alcoholic beverages for 24 hours.  ACTIVITY:  You should plan to take it easy for the rest of today and you should NOT DRIVE or use heavy machinery until tomorrow (because of the sedation medicines used during the test).    FOLLOW UP: Our staff will call the number listed on your records the next business day following your procedure.  We will call around 7:15- 8:00 am to check on you and address any questions or concerns that you may have regarding the information given to you following your procedure. If we do not reach you, we will leave a message.     If any biopsies were taken you will be contacted by phone or by letter within the next 1-3 weeks.  Please call us  at [phone] if you have not heard about the biopsies in 3 weeks.    SIGNATURES/CONFIDENTIALITY: You and/or your care partner have signed paperwork which will be entered into your electronic medical record.  These signatures attest to the fact that that the information above on your After Visit Summary has been reviewed and is understood.  Full responsibility of the confidentiality of this discharge information lies with you and/or your care-partner.

## 2023-09-21 NOTE — Progress Notes (Signed)
 Report to PACU, RN, vss, BBS= Clear.

## 2023-09-21 NOTE — Progress Notes (Signed)
 Called to room to assist during endoscopic procedure.  Patient ID and intended procedure confirmed with present staff. Received instructions for my participation in the procedure from the performing physician.

## 2023-09-24 ENCOUNTER — Telehealth: Payer: Self-pay

## 2023-09-24 NOTE — Telephone Encounter (Signed)
  Follow up Call-     09/21/2023    7:22 AM  Call back number  Post procedure Call Back phone  # [phone]  Permission to leave phone message Yes     Patient questions:  Do you have a fever, pain , or abdominal swelling? No. Pain Score  0 *  Have you tolerated food without any problems? Yes.    Have you been able to return to your normal activities? Yes.    Do you have any questions about your discharge instructions: Diet   No. Medications  No. Follow up visit  No.  Do you have questions or concerns about your Care? No.  Actions: * If pain score is 4 or above: No action needed, pain <4.

## 2023-09-27 LAB — SURGICAL PATHOLOGY

## 2023-10-01 ENCOUNTER — Ambulatory Visit: Payer: Self-pay | Admitting: Pediatrics

## 2023-10-01 DIAGNOSIS — A048 Other specified bacterial intestinal infections: Secondary | ICD-10-CM

## 2023-10-02 MED ORDER — BISMUTH/METRONIDAZ/TETRACYCLIN 140-125-125 MG PO CAPS: 3.0000 | ORAL_CAPSULE | Freq: Four times a day (QID) | ORAL | 0 refills | Status: DC

## 2023-10-02 MED ORDER — OMEPRAZOLE 40 MG PO CPDR: 40.0000 mg | DELAYED_RELEASE_CAPSULE | Freq: Two times a day (BID) | ORAL | Status: DC

## 2023-10-02 NOTE — Addendum Note (Signed)
 Addended by: MERCER CRISTINO SAILOR on: 10/02/2023 09:31 AM   Modules accepted: Orders

## 2023-11-07 ENCOUNTER — Ambulatory Visit (HOSPITAL_COMMUNITY)
Admission: RE | Admit: 2023-11-07 | Discharge: 2023-11-07 | Disposition: A | Discharge status: Home / Self Care | Source: Ambulatory Visit | Attending: Internal Medicine | Admitting: Internal Medicine

## 2023-11-07 ENCOUNTER — Encounter (HOSPITAL_COMMUNITY): Payer: Self-pay

## 2023-11-07 VITALS — BP 115/77 | HR 70 | Temp 97.6°F | Resp 18

## 2023-11-07 DIAGNOSIS — J069 Acute upper respiratory infection, unspecified: Secondary | ICD-10-CM

## 2023-11-07 DIAGNOSIS — R0981 Nasal congestion: Secondary | ICD-10-CM

## 2023-11-07 LAB — POC COVID19/FLU A&B COMBO
Covid Antigen, POC: NEGATIVE
Influenza A Antigen, POC: NEGATIVE
Influenza B Antigen, POC: NEGATIVE

## 2023-11-07 LAB — POCT RAPID STREP A (OFFICE): Rapid Strep A Screen: NEGATIVE

## 2023-11-07 NOTE — Discharge Instructions (Signed)
 Testing today was negative for covid, flu, and strep throat. I believe you have an viral upper respiratory infection. As we discussed, I recommend using cough/cold medication, tylenol , and staying well-hydrated. Return to care if symptoms worsen, you develop fever, or symptom persist into next week.

## 2023-11-07 NOTE — ED Triage Notes (Signed)
 Pt c/o sore throat since yesterday. States today her throat is more inflamed and red. States feels sluggish today but could be jet lag from traveling. Denies taking any meds.

## 2023-11-07 NOTE — ED Provider Notes (Signed)
 [facility]    CSN: [id number] Arrival date & time: 11/07/23  1200      History   Chief Complaint Chief Complaint  Patient presents with   Sore Throat    Traveled abroad and became sick. - Entered by patient   appt-sore throat    HPI Selena Scott is a 43 y.o. female presenting today with a 1 day history of throat irritation and sinus congestion.  She has been on vacation in [place] and noted onset of symptoms prior to returning home yesterday.  She denies fever/chills, nausea/vomiting, and diarrhea.  She endorses cough with scant sputum production.  She is unaware of any specific sick contacts but has been around crowds while on vacation and traveling.  She has not tried any medication for symptom relief.  She is able to tolerate both solids and liquids with mild discomfort.    Past Medical History:  Diagnosis Date   Anemia    Fibroid    GERD (gastroesophageal reflux disease)    hx of    HPV (human papilloma virus) anogenital infection    Medical history non-contributory    Thyroid  disease     Patient Active Problem List   Diagnosis Date Noted   Other hypoparathyroidism (HCC) 06/20/2021   Missed period 06/16/2021   History of thyroidectomy 01/25/2018   Postsurgical hypothyroidism 06/15/2017   Iatrogenic hypocalcemia 04/26/2017   Genital herpes simplex 06/04/2015    Past Surgical History:  Procedure Laterality Date   NO PAST SURGERIES     THYROIDECTOMY N/A 04/19/2017   Procedure: TOTAL THYROIDECTOMY;  Surgeon: Eletha Boas, MD;  Location: [facility];  Service: General;  Laterality: N/A;    OB History     Gravida  3   Para  3   Term  3   Preterm      AB      Living  3      SAB      IAB      Ectopic      Multiple  0   Live Births  3            Home Medications    Prior to Admission medications   Medication Sig Start Date End Date Taking? Authorizing Provider  Bismuth /Metronidaz/Tetracyclin (PYLERA) 140-125-125 MG CAPS Take 3  capsules by mouth in the morning, at noon, in the evening, and at bedtime for 10 days. 10/02/23 10/12/23  Suzann Inocente HERO, MD  calcitRIOL  (ROCALTROL ) 0.5 MCG capsule Take 2 capsules (1 mcg total) by mouth daily. 11/24/22   Shamleffer, Ibtehal Jaralla, MD  Calcium  Carbonate-Vit D-Min (CALTRATE 600+D PLUS MINERALS) 600-800 MG-UNIT TABS Take 2 tablets by mouth in the morning and at bedtime. 05/12/22   Shamleffer, Ibtehal Jaralla, MD  cholecalciferol (VITAMIN D3) 25 MCG (1000 UNIT) tablet Take 2 tablets (2,000 Units total) by mouth daily. 06/21/21   Shamleffer, Ibtehal Jaralla, MD  hydrocortisone  (ANUSOL -HC) 2.5 % rectal cream Place 1 Application rectally as needed for hemorrhoids or anal itching.    [provider]  hydrocortisone  (ANUSOL -HC) 25 MG suppository Place 1 suppository (25 mg total) rectally 2 (two) times daily. 09/17/23   Craig Alan SAUNDERS, PA-C  levothyroxine  (SYNTHROID ) 112 MCG tablet Take 1 tablet (112 mcg total) by mouth daily. 11/24/22   Shamleffer, Ibtehal Jaralla, MD  nystatin cream (MYCOSTATIN) Apply 1 Application topically as needed for dry skin.    [provider]  omeprazole  (PRILOSEC) 40 MG capsule Take 1 capsule (40 mg total) by mouth  in the morning and at bedtime for 10 days. 10/02/23 10/12/23  McGrealInocente HERO, MD    Family History Family History  Problem Relation Age of Onset   Diabetes type II Mother    Breast cancer Mother    Hypertension Mother    Fibroids Mother    Other Brother        34 brothers   Cataracts Maternal Grandmother    Diabetes Maternal Grandmother    Heart attack Maternal Grandfather    Diabetes Maternal Grandfather    Scoliosis Daughter    Thyroid  disease Neg Hx    Colon cancer Neg Hx    Esophageal cancer Neg Hx    Rectal cancer Neg Hx    Stomach cancer Neg Hx     Social History Social History   Tobacco Use   Smoking status: Never   Smokeless tobacco: Never  Vaping Use   Vaping status: Never Used  Substance Use Topics    Alcohol use: Yes    Comment: occasional wine   Drug use: No     Allergies   Patient has no known allergies.   Review of Systems Review of Systems  Constitutional:  Positive for fatigue. Negative for chills and fever.  HENT:  Positive for congestion, rhinorrhea, sinus pressure, sinus pain and sore throat. Negative for ear pain, facial swelling and trouble swallowing.   Respiratory:  Positive for cough. Negative for shortness of breath.   Gastrointestinal:  Negative for abdominal pain, diarrhea, nausea and vomiting.     Physical Exam Triage Vital Signs ED Triage Vitals [11/07/23 1221]  Encounter Vitals Group     BP 115/77     Girls Systolic BP Percentile      Girls Diastolic BP Percentile      Boys Systolic BP Percentile      Boys Diastolic BP Percentile      Pulse Rate 70     Resp 18     Temp 97.6 F (36.4 C)     Temp Source Oral     SpO2 98 %     Weight      Height      Head Circumference      Peak Flow      Pain Score 0     Pain Loc      Pain Education      Exclude from Growth Chart    No data found.  Updated Vital Signs BP 115/77 (BP Location: Right Arm)   Pulse 70   Temp 97.6 F (36.4 C) (Oral)   Resp 18   SpO2 98%   Physical Exam Vitals reviewed.  Constitutional:      General: She is not in acute distress.    Appearance: She is well-developed. She is not toxic-appearing.  HENT:     Head: Normocephalic and atraumatic.     Right Ear: Tympanic membrane and ear canal normal.     Left Ear: Tympanic membrane and ear canal normal.     Nose: Congestion and rhinorrhea present.     Mouth/Throat:     Mouth: Mucous membranes are moist.     Pharynx: Posterior oropharyngeal erythema present.     Tonsils: No tonsillar exudate.  Eyes:     Conjunctiva/sclera: Conjunctivae normal.     Pupils: Pupils are equal, round, and reactive to light.  Cardiovascular:     Rate and Rhythm: Normal rate and regular rhythm.     Heart sounds: Normal heart sounds.  Pulmonary:  Effort: Pulmonary effort is normal.     Breath sounds: Normal breath sounds. No wheezing, rhonchi or rales.  Abdominal:     General: Bowel sounds are normal.     Palpations: Abdomen is soft.     Tenderness: There is no abdominal tenderness. There is no rebound.  Musculoskeletal:     Cervical back: Normal range of motion.  Lymphadenopathy:     Cervical: No cervical adenopathy.  Skin:    General: Skin is warm and dry.     Capillary Refill: Capillary refill takes less than 2 seconds.     Findings: No erythema or rash.  Neurological:     General: No focal deficit present.     Mental Status: She is alert and oriented to person, place, and time.  Psychiatric:        Mood and Affect: Mood normal.        Behavior: Behavior normal.    UC Treatments / Results  Labs (all labs ordered are listed, but only abnormal results are displayed) Labs Reviewed  POCT RAPID STREP A (OFFICE)  POC COVID19/FLU A&B COMBO    EKG   Radiology No results found.  Procedures Procedures (including critical care time)  Medications Ordered in UC Medications - No data to display  Initial Impression / Assessment and Plan / UC Course  I have reviewed the triage vital signs and the nursing notes.  Pertinent labs & imaging results that were available during my care of the patient were reviewed by me and considered in my medical decision making (see chart for details).    43 year old female presenting today endorsing a 1 day history of symptoms noted above.  Testing in urgent care today is negative for strep today and COVID/flu.  Believe the likely etiology of her symptoms is viral URI.  Treatment options reviewed.  Recommend conservative treatment measures for now.  No indication for antibiotic therapy at this time.  She was instructed to return to care if symptoms worsen or fail to improve, or if she develops fever.  Patient agreeable to this plan.  She is stable for discharge at this time.  Final  Clinical Impressions(s) / UC Diagnoses   Final diagnoses:  Nasal congestion  Viral upper respiratory infection     Discharge Instructions      Testing today was negative for covid, flu, and strep throat. I believe you have an viral upper respiratory infection. As we discussed, I recommend using cough/cold medication, tylenol , and staying well-hydrated. Return to care if symptoms worsen, you develop fever, or symptom persist into next week.      ED Prescriptions   None    PDMP not reviewed this encounter.   Melvenia Manus BRAVO, MD 11/07/23 [phone]

## 2023-11-08 NOTE — Progress Notes (Signed)
  Gastroenterology Return Visit   Referring Provider Early, Camie BRAVO, NP [address] [place],  [place] [address]  Primary Care Provider Early, Camie BRAVO, NP  Patient Profile: Selena Scott is a 43 y.o. female with a past medical history noteworthy for thyroid  disease, fibroids, anemia who returns to the [facility] for follow-up of the problem(s) noted below.  Problem List: H. pylori gastritis 08/2023 RLQ/RUQ abdominal pain Rectal pain/irritation - internal and external hemorrhoids present   History of Present Illness   Ms. Wengert was last seen in the GI office 09/17/2023 by Alan Coombs, PA   Current GI Meds  Anusol  suppositories 25 mg per rectum twice daily  Interval History   Discussed the use of AI scribe software for clinical note transcription with the patient, who gave verbal consent to proceed.  History of Present Illness  Helicobacter pylori infection - EGD 08/2023 confirmed presence of H. pylori on biopsies - Completed treatment for H. pylori infection with Pylera and omeprazole  - Stool specimen for eradication confirmation has not yet been submitted - States there may be slight improvement in her abdominal pain but incomplete resolution  Right-sided abdominal pain - Persistent right-sided abdominal pain with a pulling sensation, especially with twisting or movement - No history of injury - Suspects pain may be related to physical activity, such as pull-ups, which she has discontinued - At last visit was noted to have positive Carnett's sign on physical exam - 04/2021-RUQ ultrasound unremarkable - 08/2023 transvaginal ultrasound unremarkable - CTAP ordered after recent EGD and colonoscopy but not completed -repeat order placed by office staff today  Rectal irritation and hemorrhoids - Rectal irritation and hemorrhoids present for over two years - Symptoms include itching and inflammation - Hydrocortisone  cream and suppositories  provide temporary relief - No constipation or blood in stool  - Change in bowel habits after returning from [place], likely related to dietary changes - Consumes one cup of coffee daily - Drinks water throughout the day - Diet may be low in fiber due to eating quickly at work  - Colonoscopy 08/2023 - normal colon and TI; internal and external hemorrhoids present, no fissure  GI Review of Symptoms Significant for right-sided abdominal pain, anorectal irritation. Otherwise negative.  General Review of Systems  Review of systems is significant for the pertinent positives and negatives as listed per the HPI.  Full ROS is otherwise negative.  Past Medical History   Past Medical History:  Diagnosis Date   Anemia    Fibroid    GERD (gastroesophageal reflux disease)    hx of    HPV (human papilloma virus) anogenital infection    Medical history non-contributory    Thyroid  disease      Past Surgical History   Past Surgical History:  Procedure Laterality Date   NO PAST SURGERIES     THYROIDECTOMY N/A 04/19/2017   Procedure: TOTAL THYROIDECTOMY;  Surgeon: Eletha Boas, MD;  Location: [facility];  Service: General;  Laterality: N/A;     Allergies and Medications   No Known Allergies  Current Meds  Medication Sig   calcitRIOL  (ROCALTROL ) 0.5 MCG capsule Take 2 capsules (1 mcg total) by mouth daily.   Calcium  Carbonate-Vit D-Min (CALTRATE 600+D PLUS MINERALS) 600-800 MG-UNIT TABS Take 2 tablets by mouth in the morning and at bedtime.   cholecalciferol (VITAMIN D3) 25 MCG (1000 UNIT) tablet Take 2 tablets (2,000 Units total) by mouth daily.   hydrocortisone  (ANUSOL -HC) 2.5 % rectal cream Place 1 Application  rectally as needed for hemorrhoids or anal itching.   levothyroxine  (SYNTHROID ) 112 MCG tablet Take 1 tablet (112 mcg total) by mouth daily.   nystatin cream (MYCOSTATIN) Apply 1 Application topically as needed for dry skin.    Family His   Family History  Problem Relation Age  of Onset   Diabetes type II Mother    Breast cancer Mother    Hypertension Mother    Fibroids Mother    Other Brother        49 brothers   Cataracts Maternal Grandmother    Diabetes Maternal Grandmother    Heart attack Maternal Grandfather    Diabetes Maternal Grandfather    Scoliosis Daughter    Thyroid  disease Neg Hx    Colon cancer Neg Hx    Esophageal cancer Neg Hx    Rectal cancer Neg Hx    Stomach cancer Neg Hx     Social History   Social History   Tobacco Use   Smoking status: Never   Smokeless tobacco: Never  Vaping Use   Vaping status: Never Used  Substance Use Topics   Alcohol use: Yes    Comment: occasional wine   Drug use: No   Kaylynne reports that she has never smoked. She has never used smokeless tobacco. She reports current alcohol use. She reports that she does not use drugs.  Vital Signs and Physical Examination   Vitals:   11/09/23 1521  BP: 112/68  Pulse: 78   Body mass index is 28.87 kg/m. Weight: 163 lb (73.9 kg)  General: Well developed, well nourished, no acute distress Head: Normocephalic and atraumatic Eyes: Sclerae anicteric, EOMI Lungs: Clear throughout to auscultation Heart: Regular rate and rhythm; No murmurs, rubs or bruits Abdomen: Soft, non tender and non distended. No masses, hepatosplenomegaly or hernias noted. Normal Bowel sounds Rectal: Deferred Musculoskeletal: Symmetrical with no gross deformities     Review of Data   The following data was reviewed at the time of this encounter:   Laboratory Studies      Latest Ref Rng & Units 09/17/2023   12:15 PM 08/27/2023    9:45 AM 05/16/2021   12:25 PM  CBC  WBC 4.0 - 10.5 K/uL 5.1  5.2  5.3   Hemoglobin 12.0 - 15.0 g/dL 88.2  87.9  88.2   Hematocrit 36.0 - 46.0 % 35.6  38.1  35.5   Platelets 150.0 - 400.0 K/uL 259.0  305  329     Lab Results  Component Value Date   LIPASE 48 08/27/2023      Latest Ref Rng & Units 09/17/2023   12:15 PM 08/27/2023    9:45 AM  11/24/2022   10:00 AM  CMP  Glucose 70 - 99 mg/dL 88  81  77   BUN 6 - 23 mg/dL 15  13  11    Creatinine 0.40 - 1.20 mg/dL 8.95  9.09  9.15   Sodium 135 - 145 mEq/L 142  142  141   Potassium 3.5 - 5.1 mEq/L 4.1  4.1  4.4   Chloride 96 - 112 mEq/L 103  103  103   CO2 19 - 32 mEq/L 29  23  29    Calcium  8.4 - 10.5 mg/dL 9.1  9.0  8.5   Total Protein 6.0 - 8.3 g/dL 7.4  6.8    Total Bilirubin 0.2 - 1.2 mg/dL 0.6  0.5    Alkaline Phos 39 - 117 U/L 63  84    AST  0 - 37 U/L 20  23    ALT 0 - 35 U/L 19  29     Lab Results  Component Value Date   IRON 69 09/17/2023   TIBC 519.4 (H) 09/17/2023   FERRITIN 5.1 (L) 09/17/2023    Lab Results  Component Value Date   ESRSEDRATE 29 (H) 09/17/2023   Lab Results  Component Value Date   CRP <1.0 09/17/2023    Celiac panel negative Vitamin B 12 410   Imaging Studies  Pelvic ultrasound 09/02/2023 No acute findings. Neither ovary visualized. No adnexal mass seen.   RUQ ultrasound 05/21/2021 Negative. No hepatobiliary abnormality identified.   GI Procedures and Studies  EGD/colonoscopy 09/21/2023 EGD -normal tissue in esophagus, stomach and duodenum; 5 mm gastric polyp, small HH Colonoscopy -normal colon and TI, EH and IH Path: H. pylori chronic active gastritis, gastric polyp with chronic active gastritis   Clinical Impression  It is my clinical impression that Ms. Hemrick is a 43 y.o. female with;  H pylori gastritis 08/2023 RLQ/RUQ abdominal pain Rectal pain/irritation - internal and external hemorrhoids present  Aiyana returns to the office today after EGD and colonoscopy in June 2025 for investigation of abdominal pain and anorectal irritation.  Her EGD biopsies showed evidence of H. pylori infection.  She has been been treated with Pylera and omeprazole  but needs to complete a test of cure which will be provided to her today.    She continues to endorse abdominal discomfort over her right upper and lower quadrant.  Pain is described  as a pulling sensation that is exacerbated by physical activity.  Noted to have a positive Carnett's sign on physical exam when she was seen in June.  Based upon the information she provided today, I agree that the discomfort is likely musculoskeletal but would be appropriate to complete CT scan of the abdomen and pelvis for completeness.  A repeat order was placed for this today.  Bresha reports ongoing discomfort in the anorectal area that she describes as irritation.  States that symptoms transiently respond to topical ointments.  At the time of her colonoscopy she was noted to have internal and external hemorrhoids.  No fissure present.  Reviewed conservative management of hemorrhoids as well as augmenting fiber in her diet.  If conservative measures are ineffective can refer her to colorectal surgery for consideration of other modalities.  Plan  Provide get for H. pylori stool antigen for test of cure Schedule CT scan of the abdomen pelvis with contrast for further investigation of right-sided abdominal pain Fiber handout provided today to increase fiber in diet related to change in bowel habits and rectal irritation Continue use of topical hydrocortisone  cream for hemorrhoids -if symptoms or not improving despite conservative management we will refer to colorectal surgery Next screening colonoscopy due 08/2033  Planned Follow Up  4 months  The patient or caregiver verbalized understanding of the material covered, with no barriers to understanding. All questions were answered. Patient or caregiver is agreeable with the plan outlined above.    It was a pleasure to see Hannan.  If you have any questions or concerns regarding this evaluation, do not hesitate to contact me.  Inocente Hausen, MD [facility]   I spent total of 30 minutes in both face-to-face (20 minutes interview) and non-face-to-face (10 minutes chart review, care coordination, documentation)  activities, excluding  procedures performed, for the visit on the date of this encounter.

## 2023-11-09 ENCOUNTER — Encounter: Payer: Self-pay | Admitting: Pediatrics

## 2023-11-09 ENCOUNTER — Ambulatory Visit: Admitting: Pediatrics

## 2023-11-09 VITALS — BP 112/68 | HR 78 | Ht 63.0 in | Wt 163.0 lb

## 2023-11-09 DIAGNOSIS — R109 Unspecified abdominal pain: Secondary | ICD-10-CM | POA: Diagnosis not present

## 2023-11-09 DIAGNOSIS — K6289 Other specified diseases of anus and rectum: Secondary | ICD-10-CM

## 2023-11-09 DIAGNOSIS — A048 Other specified bacterial intestinal infections: Secondary | ICD-10-CM

## 2023-11-09 NOTE — Patient Instructions (Signed)
 You have been scheduled for a CT scan of the abdomen and pelvis at [facility], 1st floor Radiology. You are scheduled on 11/13/23 at 5:30. You should arrive at 4:50 pm prior to your appointment time for registration.    You may take any medications as prescribed with a small amount of water, if necessary. If you take any of the following medications: METFORMIN, GLUCOPHAGE, GLUCOVANCE, AVANDAMET, RIOMET, FORTAMET, ACTOPLUS MET, JANUMET, GLUMETZA or METAGLIP, you MAY be asked to HOLD this medication 48 hours AFTER the exam.   The purpose of you drinking the oral contrast is to aid in the visualization of your intestinal tract. The contrast solution may cause some diarrhea. Depending on your individual set of symptoms, you may also receive an intravenous injection of x-ray contrast/dye. Plan on being at [facility] for 45 minutes or longer, depending on the type of exam you are having performed.   If you have any questions regarding your exam or if you need to reschedule, you may call [facility] at [phone] between the hours of 8:00 am and 5:00 pm, Monday-Friday.    Your provider has ordered Diatherix stool testing for you. You have received a kit from our office today containing all necessary supplies to complete this test. Please carefully read the stool collection instructions provided in the kit before opening the accompanying materials. In addition, be sure there is a label providing your full name and date of birth on the puritan opti-swab tube that is supplied in the kit (if you do not see a label with this information on your test tube, please make us  aware before test collection!). After completing the test, you should secure the purtian tube into the specimen biohazard bag. The [facility] E-Req sheet (including date and time of specimen collection) should be placed into the outside pocket of the specimen biohazard bag and returned to the [place] lab (basement  floor of [facility]) within 3 days of collection. Please make sure to give the specimen to a staff member at the lab. DO NOT leave the specimen on the counter.   If the specimen date and time (can be found in the upper right boxed portion of the sheet) are not filled out on the E-Req sheet, the test will NOT be performed.   Follow up in 4 months.  Thank you for entrusting me with your care and for choosing [facility], Dr. Inocente Hausen   _______________________________________________________  If your blood pressure at your visit was 140/90 or greater, please contact your primary care physician to follow up on this.  _______________________________________________________  If you are age 63 or older, your body mass index should be between 23-30. Your Body mass index is 28.87 kg/m. If this is out of the aforementioned range listed, please consider follow up with your Primary Care Provider.  If you are age 68 or younger, your body mass index should be between 19-25. Your Body mass index is 28.87 kg/m. If this is out of the aformentioned range listed, please consider follow up with your Primary Care Provider.   ________________________________________________________  The [place] [facility] providers would like to encourage you to use MYCHART to communicate with providers for non-urgent requests or questions.  Due to long hold times on the telephone, sending your provider a message by [facility] may be a faster and more efficient way to get a response.  Please allow 48 business hours for a response.  Please remember that this is  for non-urgent requests.  _______________________________________________________  [facility] is using a team-based approach to care.  Your team is made up of your doctor and two to three APPS. Our APPS (Nurse Practitioners and Physician Assistants) work with your physician to ensure care continuity for you. They are fully qualified to  address your health concerns and develop a treatment plan. They communicate directly with your gastroenterologist to care for you. Seeing the Advanced Practice Practitioners on your physician's team can help you by facilitating care more promptly, often allowing for earlier appointments, access to diagnostic testing, procedures, and other specialty referrals.

## 2023-11-12 DIAGNOSIS — R109 Unspecified abdominal pain: Secondary | ICD-10-CM | POA: Diagnosis not present

## 2023-11-12 DIAGNOSIS — A048 Other specified bacterial intestinal infections: Secondary | ICD-10-CM | POA: Diagnosis not present

## 2023-11-13 ENCOUNTER — Ambulatory Visit (HOSPITAL_COMMUNITY)

## 2023-11-20 ENCOUNTER — Ambulatory Visit (HOSPITAL_COMMUNITY)
Admission: RE | Admit: 2023-11-20 | Discharge: 2023-11-20 | Disposition: A | Discharge status: Home / Self Care | Source: Ambulatory Visit | Attending: Pediatrics | Admitting: Pediatrics

## 2023-11-20 DIAGNOSIS — A048 Other specified bacterial intestinal infections: Secondary | ICD-10-CM | POA: Diagnosis not present

## 2023-11-20 DIAGNOSIS — R109 Unspecified abdominal pain: Secondary | ICD-10-CM | POA: Insufficient documentation

## 2023-11-20 DIAGNOSIS — K760 Fatty (change of) liver, not elsewhere classified: Secondary | ICD-10-CM | POA: Diagnosis not present

## 2023-11-20 MED ORDER — IOHEXOL 300 MG/ML  SOLN
100.0000 mL | Freq: Once | INTRAMUSCULAR | Status: AC | PRN
  Administered 2023-11-20: 100 mL via INTRAVENOUS

## 2023-12-04 ENCOUNTER — Ambulatory Visit: Payer: Self-pay | Admitting: Pediatrics

## 2024-01-15 ENCOUNTER — Encounter: Payer: Self-pay | Admitting: Pediatrics

## 2024-01-25 ENCOUNTER — Other Ambulatory Visit: Payer: Self-pay | Admitting: Internal Medicine

## 2024-01-28 ENCOUNTER — Telehealth: Payer: Self-pay | Admitting: Internal Medicine

## 2024-01-28 ENCOUNTER — Encounter: Payer: Self-pay | Admitting: Pediatrics

## 2024-01-28 NOTE — Telephone Encounter (Signed)
 MEDICATION: Levothyroxine  Sodium 112 mcg Oral Daily  PHARMACY:  [id number] [place] ([address] [place] - [address] [place] ([place]) [place] [address] Phone: [phone]  Fax: [phone]   HAS THE PATIENT CONTACTED THEIR PHARMACY?  Yes  LAST REFILL:  @@LASTREFILL @  IS THIS A 90 DAY SUPPLY : Yes  IS PATIENT OUT OF MEDICATION: No  IF NOT; HOW MUCH IS LEFT: 1 pill left  LAST APPOINTMENT DATE: @10 /31/2025  NEXT APPOINTMENT DATE:@Visit  date not found  DO WE HAVE YOUR PERMISSION TO LEAVE A DETAILED MESSAGE?: Yes  OTHER COMMENTS:    **Let patient know to contact pharmacy at the end of the day to make sure medication is ready. **  ** Please notify patient to allow 48-72 hours to process**  **Encourage patient to contact the pharmacy for refills or they can request refills through [facility]**

## 2024-01-30 NOTE — Telephone Encounter (Signed)
 Refill was sent but patient needs an appointment.

## 2024-02-04 ENCOUNTER — Encounter: Payer: Self-pay | Admitting: Internal Medicine

## 2024-02-04 ENCOUNTER — Other Ambulatory Visit

## 2024-02-04 ENCOUNTER — Ambulatory Visit: Admitting: Internal Medicine

## 2024-02-04 VITALS — Ht 63.0 in | Wt 169.0 lb

## 2024-02-04 DIAGNOSIS — E890 Postprocedural hypothyroidism: Secondary | ICD-10-CM

## 2024-02-04 DIAGNOSIS — E2089 Other specified hypoparathyroidism: Secondary | ICD-10-CM

## 2024-02-04 MED ORDER — CALCITRIOL 0.5 MCG PO CAPS: 1.0000 ug | ORAL_CAPSULE | Freq: Every day | ORAL | 3 refills | Status: AC

## 2024-02-04 NOTE — Patient Instructions (Addendum)
 Check out the Yorvipath , which is a daily parathyroid synthetic hormone injection  Take Calcitriol  0.5 mcg ,2 capsules  with lunch  Take Caltrate 600 mg 2 tablets  with lunch and Supper Take Vitamin D3 2000 iu with Lunch

## 2024-02-04 NOTE — Progress Notes (Unsigned)
 Name: Selena Scott  MRN/ DOB: [id number], [birth year]    Age/ Sex: 43 y.o., female    PCP: Early, Camie BRAVO, NP   Reason for Endocrinology Evaluation: Postsurgical hypoparathyroidism     Date of Initial Endocrinology Evaluation: 06/20/2021    HPI: Selena Scott is a 43 y.o. female with a past medical history of pre-diabetes, postoperative hypothyroidism and postoperative hypoparathyroidism. The patient presented for initial endocrinology clinic visit on 06/20/2021 for consultative assistance with her postsurgical hypoparathyroidism.   She is status post total thyroidectomy for multinodular goiter in 03/2017.  The surgery was complicated by prolonged hypocalcemia.   She was seen previously by Dr. Kassie  (2018 ) followed by Dr. Trixie (2021)  with [facility] endocrinology followed by Dr. Dale  (2022) with [facility]   Her previous endocrinologist switched  to calcitriol  and was considering teriparatide as an off label use given that [employer] as it was not back on the market.    SUBJECTIVE:    Today (02/04/24):  Selena Scott is here for follow-up on hypothyroidism and postsurgical hypocalcemia.   No local neck swelling  No constipation  No palpitations  Has occasional dizziness but no nausea  Has leg and arm  tingling No constipation  Has cramps and spasms of the muscles  Has occasional facial tingling  She has had right abdominal chronic pain, workup has been negative, no back pain, has history of shoulder pain in the past  Home endocrine medications: Levothyroxine  112 mcg daily Calcitriol  0.5 mcg, 2 caps daily Caltrate 600 mg, 2 tablets twice daily Vitamin D3 2000 IUs daily     HISTORY:  Past Medical History:  Past Medical History:  Diagnosis Date   Anemia    Fibroid    GERD (gastroesophageal reflux disease)    hx of    HPV (human papilloma virus) anogenital infection    Medical history non-contributory    Thyroid  disease    Past Surgical History:  Past  Surgical History:  Procedure Laterality Date   NO PAST SURGERIES     THYROIDECTOMY N/A 04/19/2017   Procedure: TOTAL THYROIDECTOMY;  Surgeon: Eletha Boas, MD;  Location: [facility];  Service: General;  Laterality: N/A;    Social History:  reports that she has never smoked. She has never used smokeless tobacco. She reports current alcohol use. She reports that she does not use drugs. Family History: family history includes Breast cancer in her mother; Cataracts in her maternal grandmother; Diabetes in her maternal grandfather and maternal grandmother; Diabetes type II in her mother; Fibroids in her mother; Heart attack in her maternal grandfather; Hypertension in her mother; Other in her brother; Scoliosis in her daughter.   HOME MEDICATIONS: Allergies as of 02/04/2024   No Known Allergies      Medication List        Accurate as of February 04, 2024 10:15 AM. If you have any questions, ask your nurse or doctor.          Bismuth /Metronidaz/Tetracyclin 140-125-125 MG Caps Commonly known as: Pylera Take 3 capsules by mouth in the morning, at noon, in the evening, and at bedtime for 10 days.   calcitRIOL  0.5 MCG capsule Commonly known as: Rocaltrol  Take 2 capsules (1 mcg total) by mouth daily.   Caltrate 600+D Plus Minerals 600-800 MG-UNIT Tabs Take 2 tablets by mouth in the morning and at bedtime.   cholecalciferol 25 MCG (1000 UNIT) tablet Commonly known as: VITAMIN D3 Take 2 tablets (2,000 Units total) by  mouth daily.   hydrocortisone  2.5 % rectal cream Commonly known as: ANUSOL -HC Place 1 Application rectally as needed for hemorrhoids or anal itching.   hydrocortisone  25 MG suppository Commonly known as: ANUSOL -HC Place 1 suppository (25 mg total) rectally 2 (two) times daily.   levothyroxine  112 MCG tablet Commonly known as: SYNTHROID  Take 1 tablet by mouth once daily   nystatin cream Commonly known as: MYCOSTATIN Apply 1 Application topically as needed for dry  skin.   omeprazole  40 MG capsule Commonly known as: PRILOSEC Take 1 capsule (40 mg total) by mouth in the morning and at bedtime for 10 days.          REVIEW OF SYSTEMS: A comprehensive ROS was conducted with the patient and is negative except as per HPI    OBJECTIVE:  VS: There were no vitals taken for this visit.   Wt Readings from Last 3 Encounters:  11/09/23 163 lb (73.9 kg)  09/21/23 159 lb (72.1 kg)  09/17/23 159 lb (72.1 kg)     EXAM: General: Pt appears well and is in NAD C  Neck: General: Supple without adenopathy. Thyroid : Thyroid  size normal.  No goiter or nodules appreciated.  Lungs: Clear with good BS bilat with no rales, rhonchi, or wheezes  Heart: Auscultation: RRR.  Abdomen: soft, nontender  Extremities:  BL LE: No pretibial edema   Mental Status: Judgment, insight: Intact Orientation: Oriented to time, place, and person Mood and affect: No depression, anxiety, or agitation     DATA REVIEWED: ****  ASSESSMENT/PLAN/RECOMMENDATIONS:   Post -operative hypothyroidism   - Patient is clinically euthyroid -No local neck symptoms  Medication  Continue levothyroxine  112 mcg daily    2. Postoperative hypocalcemia:   -Patient with chronic hypocalcemia for years since surgery - I did recommend Yorvipath , patient is hesitant about committing to daily injections, I did ask her to look into it and to let me know if she would like to try it  - Labs today***   Medication Continue calcitriol  0.5 mcg, 2 caps daily Continue Caltrate 600 mg, 2 tablets twice daily Continue Vitamin D3 2000 IUs daily    Follow-up in 6 months   Signed electronically by: Stefano Redgie Butts, MD  [facility]  [facility] [address] [place] [place], [place] [address] Phone: [phone] FAX: [phone]   CC: Oris Camie BRAVO, NP [address] [place] [address] Phone: [phone] Fax:  [phone]   Return to Endocrinology clinic as below: Future Appointments  Date Time Provider Department Center  02/04/2024  2:20 PM Korban Shearer, Donell Redgie, MD [facility] None

## 2024-02-05 ENCOUNTER — Ambulatory Visit: Payer: Self-pay | Admitting: Internal Medicine

## 2024-02-05 LAB — TSH: TSH: 0.13 m[IU]/L — ABNORMAL LOW

## 2024-02-05 LAB — T4, FREE: Free T4: 1.3 ng/dL (ref 0.8–1.8)

## 2024-02-05 LAB — VITAMIN D 25 HYDROXY (VIT D DEFICIENCY, FRACTURES): Vit D, 25-Hydroxy: 66 ng/mL (ref 30–100)

## 2024-02-05 LAB — ALBUMIN: Albumin: 4.4 g/dL (ref 3.6–5.1)

## 2024-02-05 MED ORDER — LEVOTHYROXINE SODIUM 100 MCG PO TABS: 100.0000 ug | ORAL_TABLET | Freq: Every day | ORAL | 3 refills | Status: AC

## 2024-04-04 ENCOUNTER — Encounter: Payer: Self-pay | Admitting: Internal Medicine

## 2025-01-30 ENCOUNTER — Ambulatory Visit: Admitting: Internal Medicine
